# Patient Record
Sex: Female | Born: 1995 | Race: White | Hispanic: No | Marital: Married | Smoking: Never smoker
Health system: Southern US, Community
[De-identification: ages and names within clinical notes are randomized; demographics above are authoritative.]

## PROBLEM LIST (undated history)

## (undated) ENCOUNTER — Inpatient Hospital Stay (HOSPITAL_COMMUNITY): Payer: Self-pay

## (undated) DIAGNOSIS — E119 Type 2 diabetes mellitus without complications: Secondary | ICD-10-CM

## (undated) DIAGNOSIS — F909 Attention-deficit hyperactivity disorder, unspecified type: Secondary | ICD-10-CM

## (undated) DIAGNOSIS — N809 Endometriosis, unspecified: Secondary | ICD-10-CM

## (undated) DIAGNOSIS — N200 Calculus of kidney: Secondary | ICD-10-CM

## (undated) HISTORY — PX: TONSILLECTOMY: SUR1361

## (undated) HISTORY — PX: KIDNEY SURGERY: SHX687

---
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
## 2014-02-07 ENCOUNTER — Emergency Department: Payer: Self-pay

## 2014-02-07 LAB — URINALYSIS, COMPLETE
Bacteria: NONE SEEN
Bilirubin,UR: NEGATIVE
GLUCOSE, UR: NEGATIVE mg/dL (ref 0–75)
KETONE: NEGATIVE
Leukocyte Esterase: NEGATIVE
Nitrite: NEGATIVE
PH: 5 (ref 4.5–8.0)
PROTEIN: NEGATIVE
RBC,UR: 17 /HPF (ref 0–5)
SPECIFIC GRAVITY: 1.017 (ref 1.003–1.030)
Squamous Epithelial: 5
WBC UR: 2 /HPF (ref 0–5)

## 2014-02-07 LAB — CBC
HCT: 42.6 % (ref 35.0–47.0)
HGB: 14.7 g/dL (ref 12.0–16.0)
MCH: 30.1 pg (ref 26.0–34.0)
MCHC: 34.6 g/dL (ref 32.0–36.0)
MCV: 87 fL (ref 80–100)
PLATELETS: 384 10*3/uL (ref 150–440)
RBC: 4.89 10*6/uL (ref 3.80–5.20)
RDW: 12.6 % (ref 11.5–14.5)
WBC: 10.3 10*3/uL (ref 3.6–11.0)

## 2014-02-07 LAB — BASIC METABOLIC PANEL
Anion Gap: 5 — ABNORMAL LOW (ref 7–16)
BUN: 6 mg/dL — AB (ref 7–18)
CALCIUM: 9.3 mg/dL (ref 9.0–10.7)
Chloride: 104 mmol/L (ref 98–107)
Co2: 28 mmol/L (ref 21–32)
Creatinine: 0.53 mg/dL — ABNORMAL LOW (ref 0.60–1.30)
GLUCOSE: 88 mg/dL (ref 65–99)
Osmolality: 271 (ref 275–301)
Potassium: 3.7 mmol/L (ref 3.5–5.1)
Sodium: 137 mmol/L (ref 136–145)

## 2014-02-07 LAB — HCG, QUANTITATIVE, PREGNANCY: Beta Hcg, Quant.: 6864 m[IU]/mL — ABNORMAL HIGH

## 2014-02-08 IMAGING — US US OB < 14 WEEKS - US OB TV
2 series · 14 of 28 positions shown · non-contrast
Comparison: None.

CLINICAL DATA: Pregnant, 1st trimester bleeding

EXAM:
OBSTETRIC <14 WK US AND TRANSVAGINAL OB US
TECHNIQUE: Both transabdominal and transvaginal ultrasound examinations were
performed for complete evaluation of the gestation as well as the
maternal uterus, adnexal regions, and pelvic cul-de-sac.
Transvaginal technique was performed to assess early pregnancy.

[Series 1: us ob < 14 weeks - us ob tv · 0.20mm/px · 4 of 14 slices shown (1 of 2)]
[im 2/14]
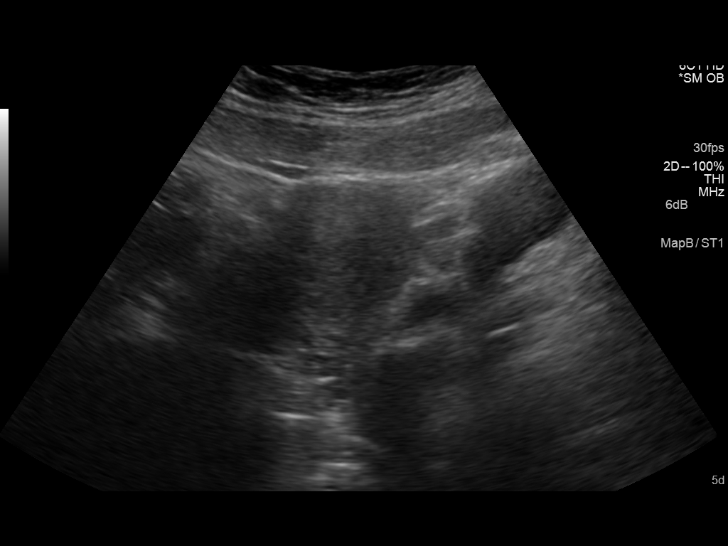
[im 6/14]
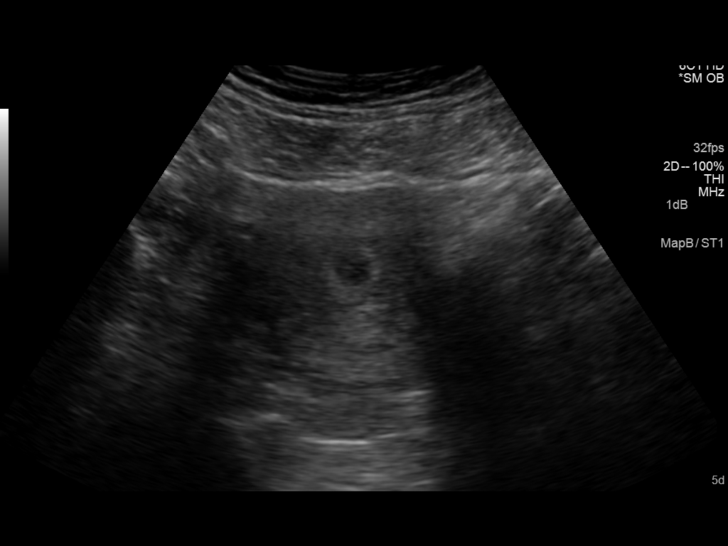
[im 10/14]
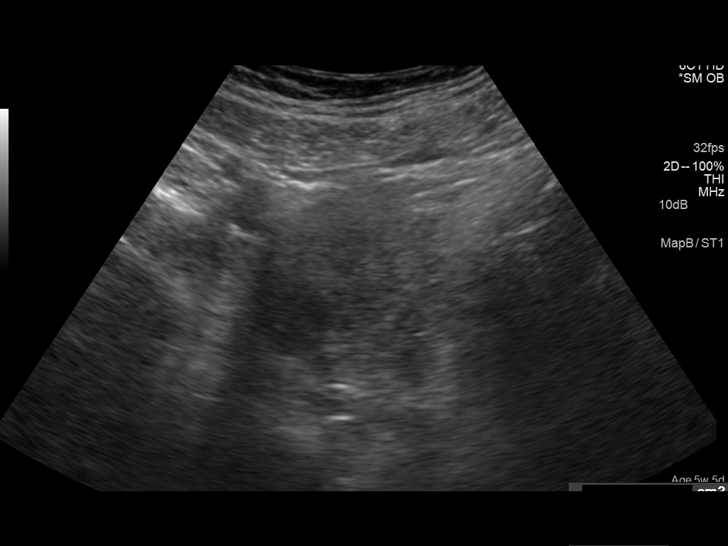
[im 14/14]
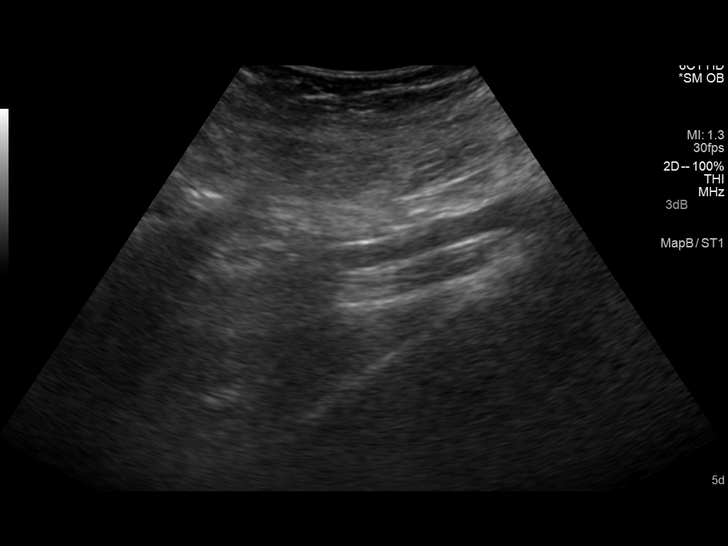

[Series 1001: us ob < 14 weeks - us ob tv · 0.12mm/px · 10 of 36 slices shown (2 of 2)]
[im 2/36]
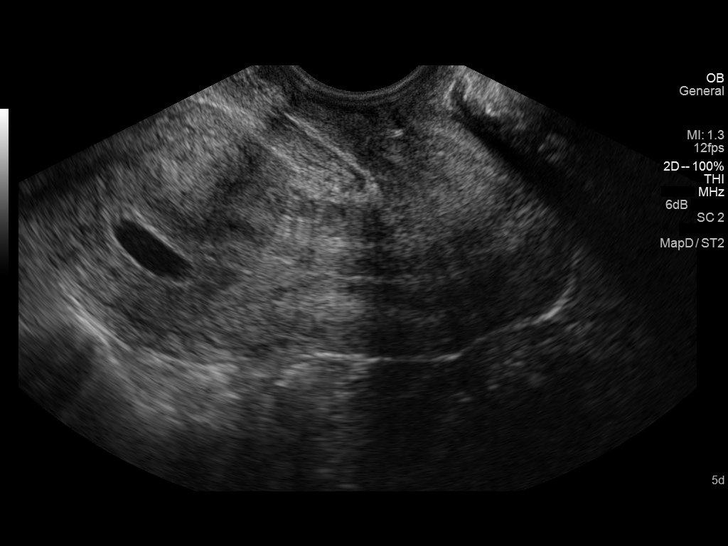
[im 6/36]
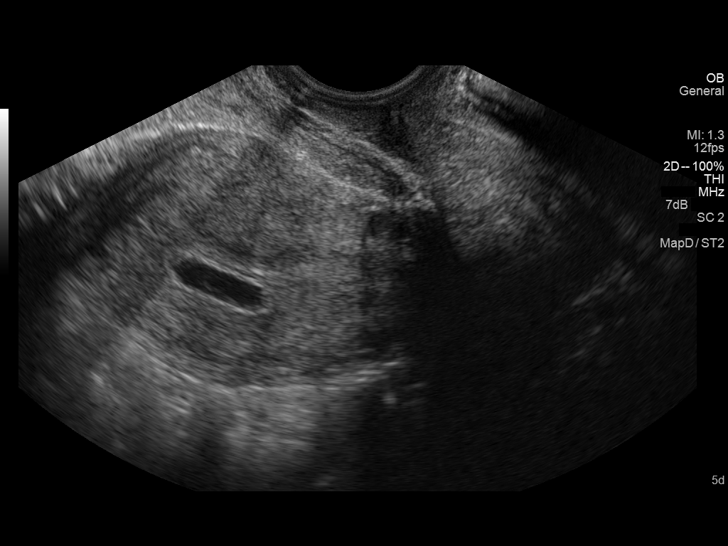
[im 10/36]
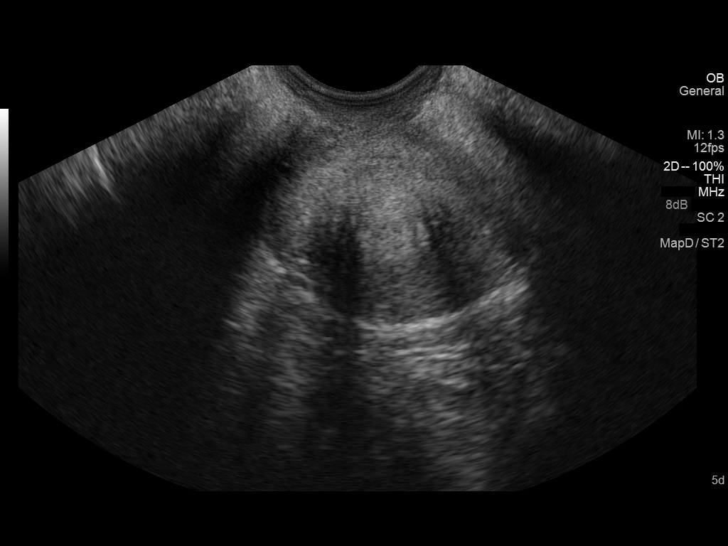
[im 13/36]
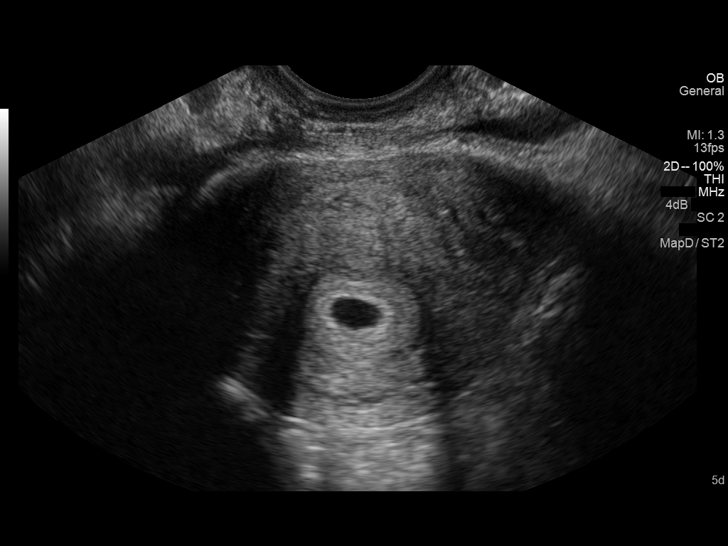
[im 17/36]
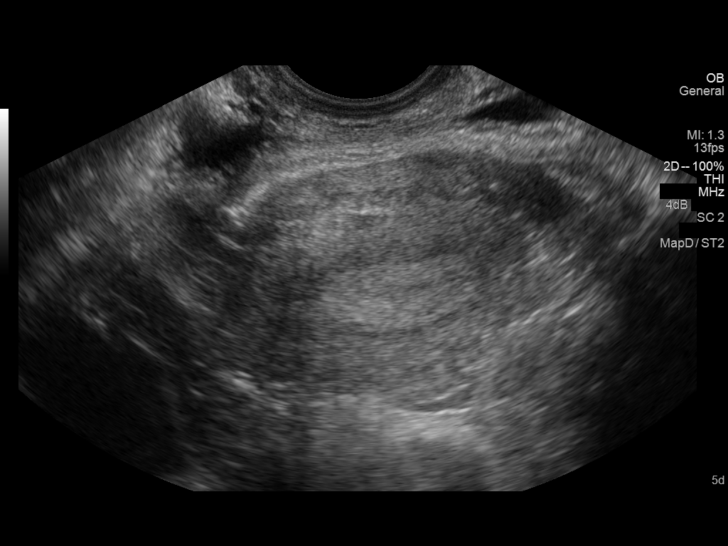
[im 21/36]
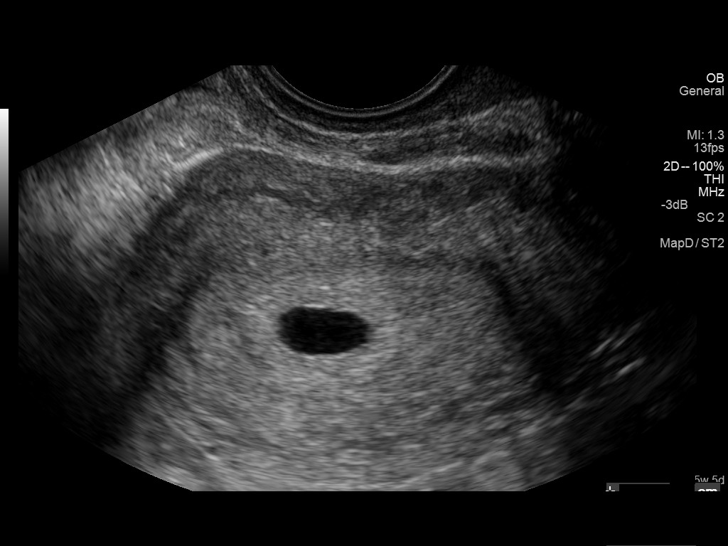
[im 24/36]
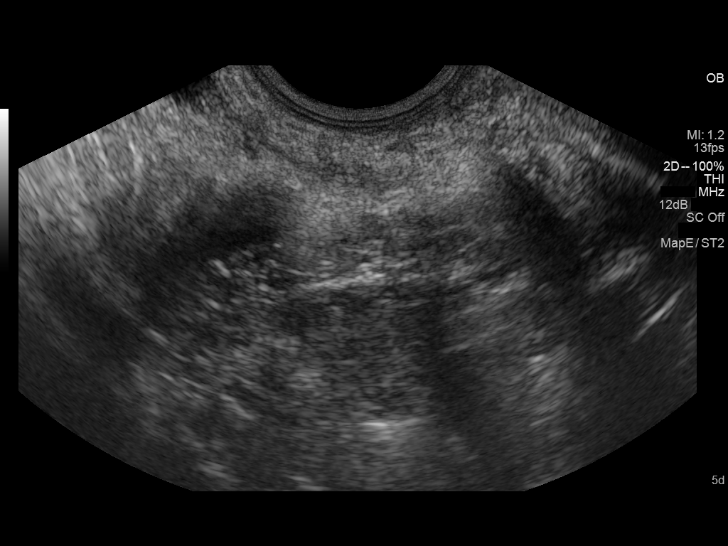
[im 28/36]
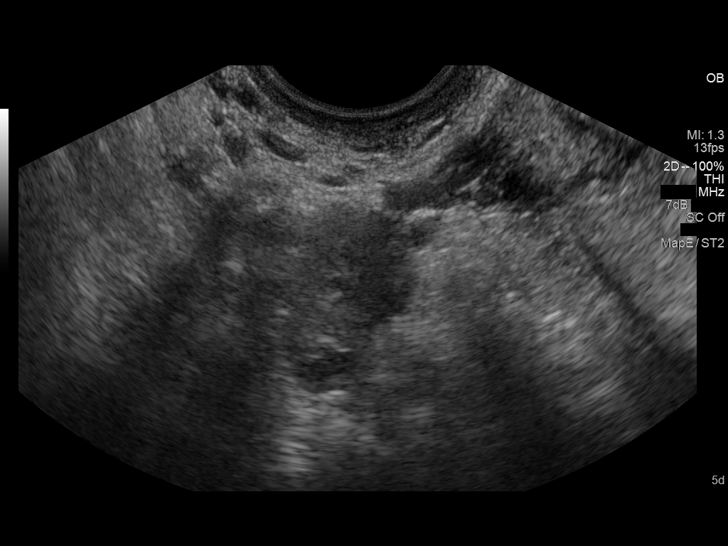
[im 32/36]
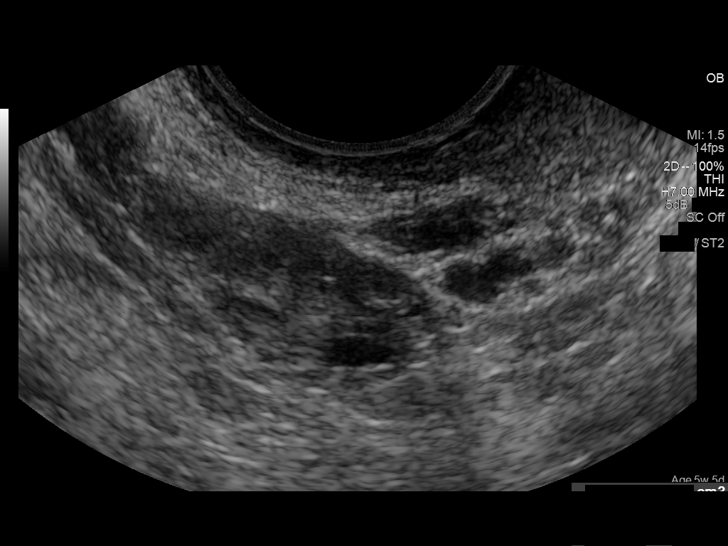
[im 36/36]
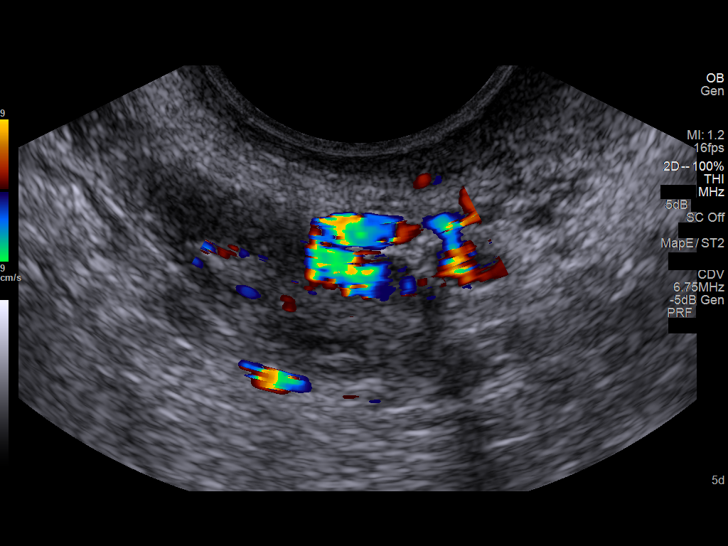

[14 of 28 positions shown; findings below may reference images not displayed]

FINDINGS: Intrauterine gestational sac: Visualized/normal in shape.

Yolk sac:  Present

Embryo:  Not visualized

MSD:  9  mm   5 w   5  d

US EDC: [DATE]

Maternal uterus/adnexae: No subchorionic hemorrhage.

Right ovary is within normal limits, measuring 2.2 x 1.7 x 1.6 cm.

Left ovary is within normal limits, measuring 1.8 x 2.9 x 1.2 cm.

No free fluid.
IMPRESSION: Single intrauterine gestational sac with yolk sac, measuring 5 weeks
5 days by mean sac diameter.

No fetal pole is visualized.

Consider follow-up pelvic ultrasound in 14 days to document
viability as clinically warranted.

## 2014-02-18 ENCOUNTER — Other Ambulatory Visit: Payer: Self-pay | Admitting: Obstetrics & Gynecology

## 2014-02-18 DIAGNOSIS — O3680X0 Pregnancy with inconclusive fetal viability, not applicable or unspecified: Secondary | ICD-10-CM

## 2014-02-20 ENCOUNTER — Other Ambulatory Visit: Payer: Self-pay

## 2014-05-22 ENCOUNTER — Observation Stay: Payer: Self-pay | Admitting: Obstetrics & Gynecology

## 2014-05-22 LAB — WET PREP, GENITAL

## 2014-05-22 LAB — URINALYSIS, COMPLETE
Bilirubin,UR: NEGATIVE
GLUCOSE, UR: NEGATIVE mg/dL (ref 0–75)
KETONE: NEGATIVE
Leukocyte Esterase: NEGATIVE
Nitrite: NEGATIVE
PROTEIN: NEGATIVE
Ph: 6 (ref 4.5–8.0)
RBC,UR: 16 /HPF (ref 0–5)
SPECIFIC GRAVITY: 1.009 (ref 1.003–1.030)

## 2014-06-28 ENCOUNTER — Observation Stay: Payer: Self-pay | Admitting: Obstetrics and Gynecology

## 2014-06-28 LAB — GC/CHLAMYDIA PROBE AMP

## 2014-09-10 ENCOUNTER — Observation Stay: Payer: Self-pay

## 2014-10-10 HISTORY — PX: GALLBLADDER SURGERY: SHX652

## 2014-12-24 DIAGNOSIS — K8050 Calculus of bile duct without cholangitis or cholecystitis without obstruction: Secondary | ICD-10-CM | POA: Insufficient documentation

## 2014-12-24 DIAGNOSIS — K8020 Calculus of gallbladder without cholecystitis without obstruction: Secondary | ICD-10-CM | POA: Insufficient documentation

## 2015-01-01 DIAGNOSIS — Z9049 Acquired absence of other specified parts of digestive tract: Secondary | ICD-10-CM | POA: Insufficient documentation

## 2015-01-07 DIAGNOSIS — R7401 Elevation of levels of liver transaminase levels: Secondary | ICD-10-CM | POA: Insufficient documentation

## 2015-01-07 DIAGNOSIS — R109 Unspecified abdominal pain: Secondary | ICD-10-CM | POA: Diagnosis present

## 2015-01-21 DIAGNOSIS — K921 Melena: Secondary | ICD-10-CM | POA: Insufficient documentation

## 2015-02-17 NOTE — H&P (Signed)
L&D Evaluation:  History Expanded:  HPI 19 yo G1P0 at 2920 5/7 weeks w crampiness and d/c tonight.  Irreg crampiness off and on this pregnancy.  Recent Ultrasound normal.  [facility].   No ROM, vaginal bleeding.   Gravida 1   Term 0   Patient's Medical History No Chronic Illness   Patient's Surgical History none   Medications Pre Natal Vitamins   Allergies NKDA   Social History none   Family History Non-Contributory   ROS:  ROS All systems were reviewed.  HEENT, CNS, GI, GU, Respiratory, CV, Renal and Musculoskeletal systems were found to be normal.   Exam:  Vital Signs stable   General no apparent distress   Mental Status clear   Chest clear   Heart normal sinus rhythm   Abdomen gravid, non-tender, gravid, tender with contractions   Estimated Fetal Weight Average for gestational age   Back no CVAT   Edema no edema   Pelvic no external lesions, cervix closed and thick, SPE- Neg pool and nitrazine, scant d/c (clear)   Mebranes Intact   FHT normal rate with no decels   Fetal Heart Rate 140   Ucx absent   Skin dry   Impression:  Impression Abd pain, d/c.   Plan:  Plan UA, wet prep   Follow Up Appointment already scheduled   Electronic Signatures: Letitia LibraHarris, Nekeshia Lenhardt Paul (MD)  (Signed 13-Aug-15 23:16)  Authored: L&D Evaluation   Last Updated: 13-Aug-15 23:16 by Letitia LibraHarris, Brailyn Delman Paul (MD)

## 2015-02-17 NOTE — H&P (Signed)
L&D Evaluation:  History:  HPI 19 year old G1 at 6310w1d gestation by D=7wk US derived EDC of 10/03/14 presenting with painless vaginal spotting.  No recent intercourse no recent cervical exam.  Bright red bleeding, noted after whipping from going to restroom.  No further bleeding since.  +FM, no LOF, no VB, no ctx    [facility].  Recently treated for UTI but UCx with no growth.   Presents with vaginal bleeding   Patient's Medical History IBS   Patient's Surgical History ovarian cystectomy and tonsillectomy   Medications Pre Natal Vitamins  macrobid   Allergies NKDA   Social History none   Family History Non-Contributory   Exam:  Vital Signs stable   Urine Protein not completed   General no apparent distress   Mental Status clear   Chest no increased work of breathing   Abdomen gravid, non-tender   Estimated Fetal Weight Average for gestational age   Back no CVAT   Edema no edema   Pelvic no external lesions, cervix closed and thick, no active bleeding or old blood visualized   Mebranes Intact   FHT normal rate with no decels   Ucx absent   Impression:  Impression 2nd trimester bleeding   Plan:  Comments 1) VB - no evidence of active bleeding, most likley source is cervical/ectroprion during pregnancy.  Routine precautions      - GC & CT cultures sent  2) Fetus - category I tracing  3) O pos / ABSC neg / RI / VZNI / HIV neg / RPR NR / HBsAg neg / negative 1st trimester screen  4) Disposition - discharge home follow up 07/16/14   Follow Up Appointment 07/16/2014   Electronic Signatures: Lorrene ReidStaebler, Robt Okuda M (MD)  (Signed 19-Sep-15 16:48)  Authored: L&D Evaluation   Last Updated: 19-Sep-15 16:48 by Lorrene ReidStaebler, Brihana Quickel M (MD)

## 2016-06-24 ENCOUNTER — Encounter: Payer: Self-pay | Admitting: Emergency Medicine

## 2016-06-24 ENCOUNTER — Emergency Department: Payer: Self-pay

## 2016-06-24 ENCOUNTER — Emergency Department
Admission: EM | Admit: 2016-06-24 | Discharge: 2016-06-24 | Disposition: A | Discharge status: Home / Self Care | Payer: Self-pay | Attending: Emergency Medicine | Admitting: Emergency Medicine

## 2016-06-24 DIAGNOSIS — N200 Calculus of kidney: Secondary | ICD-10-CM | POA: Insufficient documentation

## 2016-06-24 HISTORY — DX: Calculus of kidney: N20.0

## 2016-06-24 LAB — CBC
HCT: 42.7 % (ref 35.0–47.0)
HEMOGLOBIN: 14.8 g/dL (ref 12.0–16.0)
MCH: 29.1 pg (ref 26.0–34.0)
MCHC: 34.8 g/dL (ref 32.0–36.0)
MCV: 83.6 fL (ref 80.0–100.0)
PLATELETS: 421 10*3/uL (ref 150–440)
RBC: 5.1 MIL/uL (ref 3.80–5.20)
RDW: 13 % (ref 11.5–14.5)
WBC: 8.7 10*3/uL (ref 3.6–11.0)

## 2016-06-24 LAB — URINALYSIS COMPLETE WITH MICROSCOPIC (ARMC ONLY)
BILIRUBIN URINE: NEGATIVE
Bacteria, UA: NONE SEEN
Glucose, UA: NEGATIVE mg/dL
KETONES UR: NEGATIVE mg/dL
LEUKOCYTES UA: NEGATIVE
Nitrite: NEGATIVE
PH: 5 (ref 5.0–8.0)
Protein, ur: NEGATIVE mg/dL
Specific Gravity, Urine: 1.019 (ref 1.005–1.030)

## 2016-06-24 LAB — COMPREHENSIVE METABOLIC PANEL
ALT: 27 U/L (ref 14–54)
ANION GAP: 7 (ref 5–15)
AST: 19 U/L (ref 15–41)
Albumin: 4.3 g/dL (ref 3.5–5.0)
Alkaline Phosphatase: 78 U/L (ref 38–126)
BUN: 11 mg/dL (ref 6–20)
CALCIUM: 9.8 mg/dL (ref 8.9–10.3)
CHLORIDE: 105 mmol/L (ref 101–111)
CO2: 29 mmol/L (ref 22–32)
CREATININE: 0.8 mg/dL (ref 0.44–1.00)
Glucose, Bld: 94 mg/dL (ref 65–99)
Potassium: 3.8 mmol/L (ref 3.5–5.1)
SODIUM: 141 mmol/L (ref 135–145)
Total Bilirubin: 0.2 mg/dL — ABNORMAL LOW (ref 0.3–1.2)
Total Protein: 7.8 g/dL (ref 6.5–8.1)

## 2016-06-24 LAB — POCT PREGNANCY, URINE: PREG TEST UR: NEGATIVE

## 2016-06-24 IMAGING — CT CT RENAL STONE PROTOCOL
2 of 4 series · 16 of 46 positions shown, 18 images · non-contrast
Comparison: None available.

CLINICAL DATA: Initial evaluation for acute left lower quadrant
pain radiating to left flank.

EXAM:
CT ABDOMEN AND PELVIS WITHOUT CONTRAST
TECHNIQUE: Multidetector CT imaging of the abdomen and pelvis was performed
following the standard protocol without IV contrast.

[Series 2: axial st · axial · 0.84mm/px · z∈[-836,-420]mm · 13 of 91 slices shown, 15 images]
[im 4/91  soft-tissue]
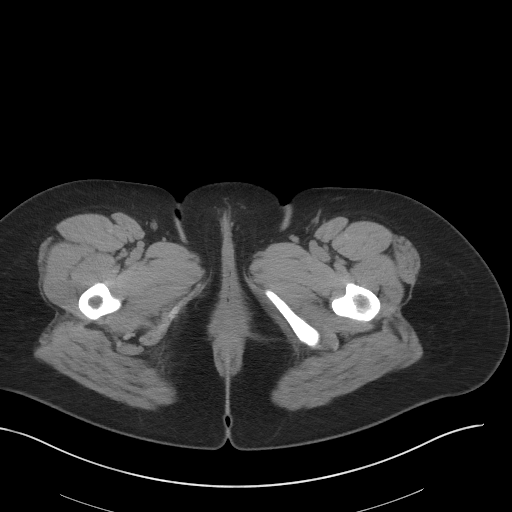
[im 4/91  bone]
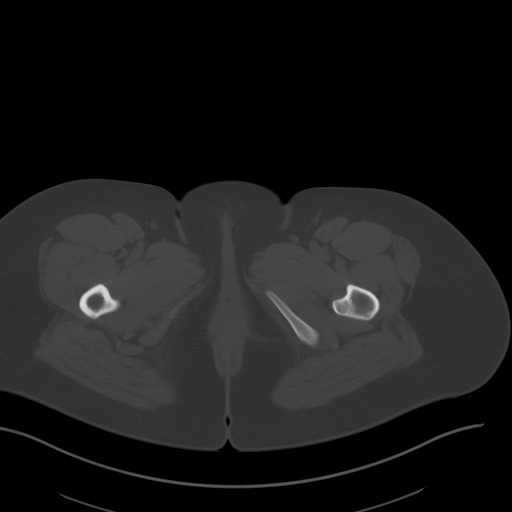
[im 11/91  soft-tissue]
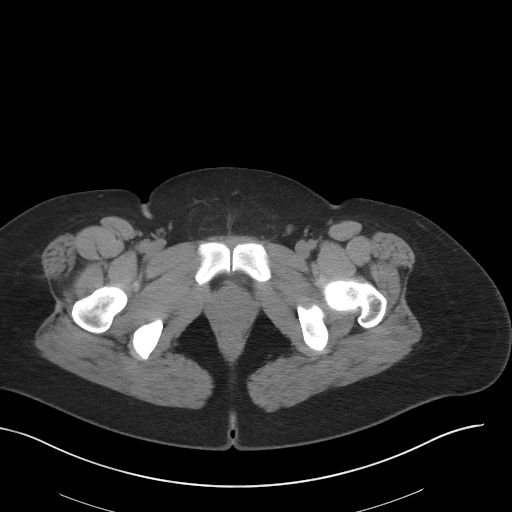
[im 19/91  soft-tissue]
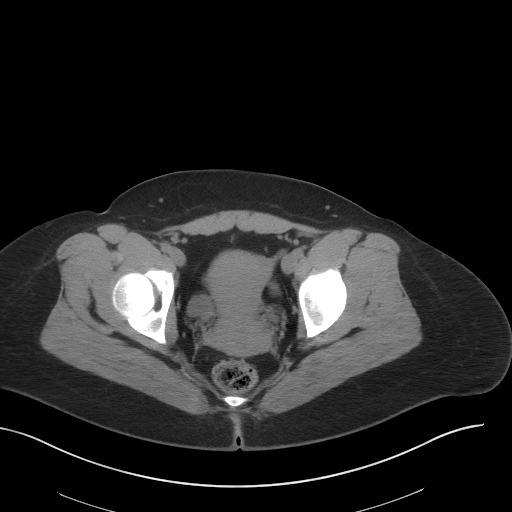
[im 26/91  soft-tissue]
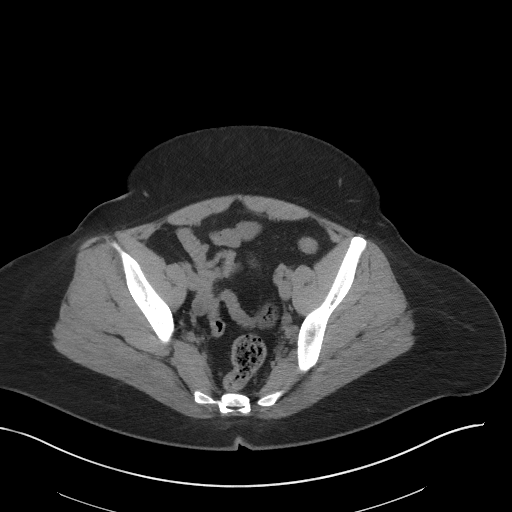
[im 33/91  soft-tissue]
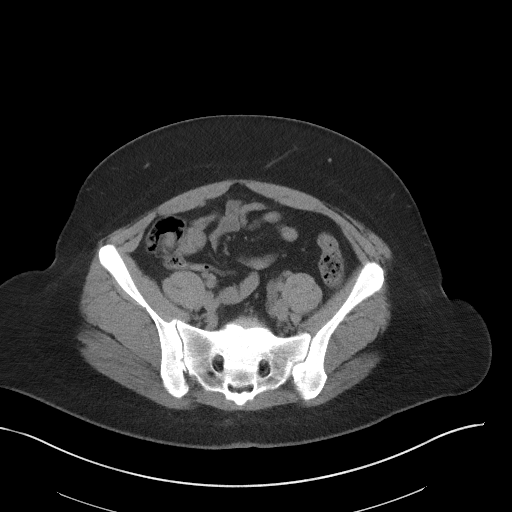
[im 40/91  soft-tissue]
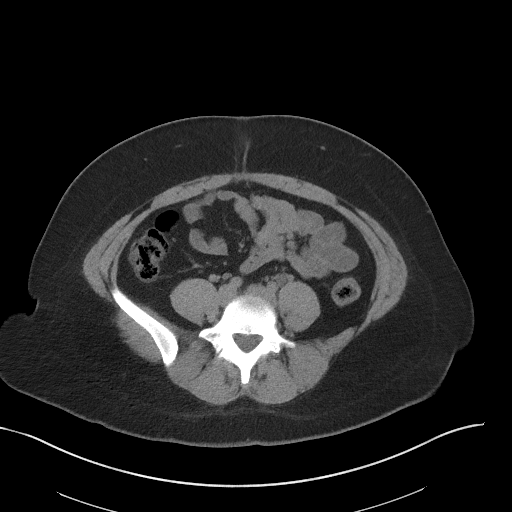
[im 47/91  soft-tissue]
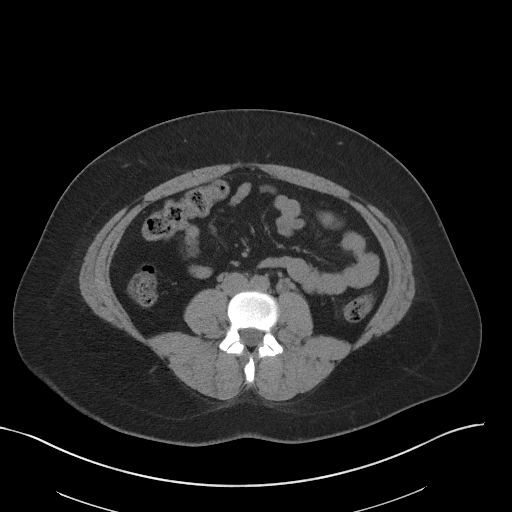
[im 51/91  soft-tissue]
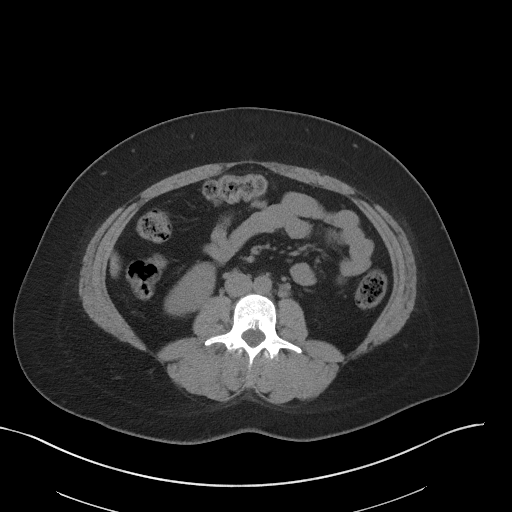
[im 58/91  soft-tissue]
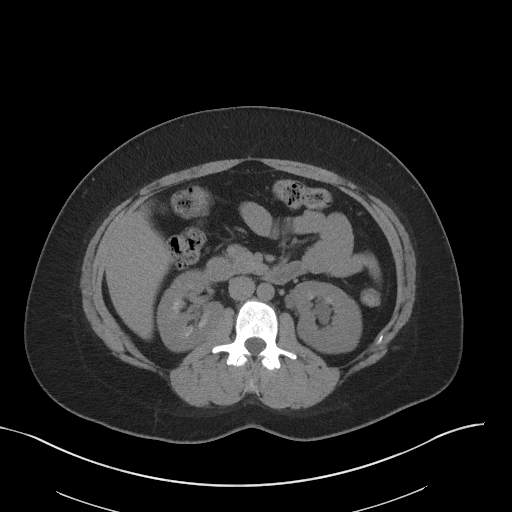
[im 58/91  bone]
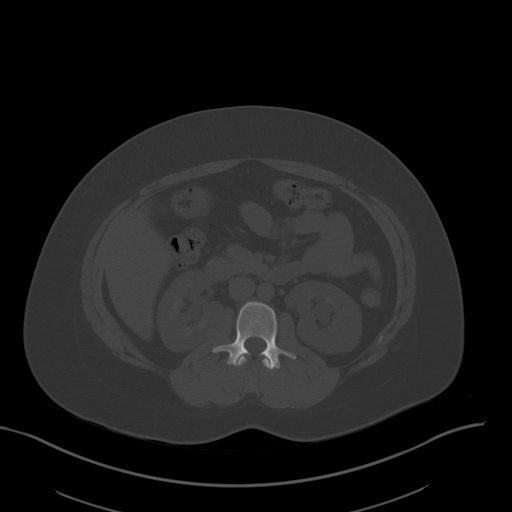
[im 65/91  soft-tissue]
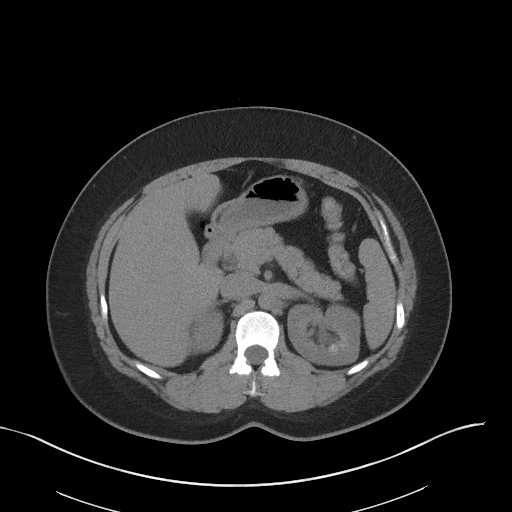
[im 73/91  soft-tissue]
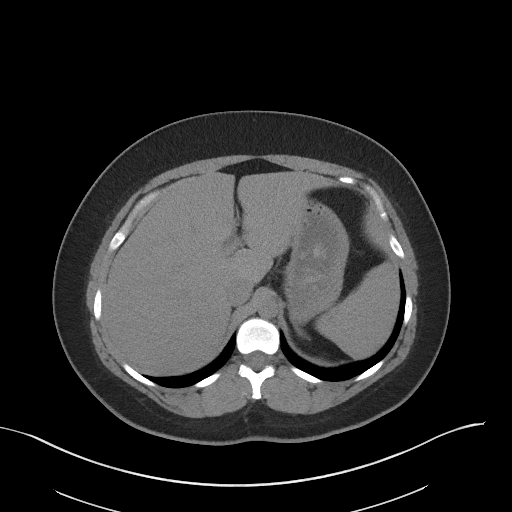
[im 80/91  soft-tissue]
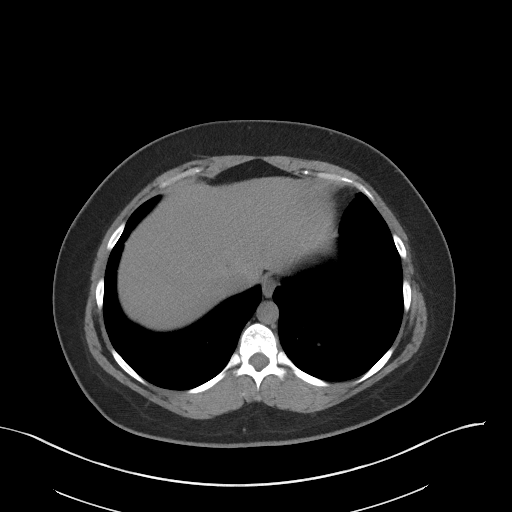
[im 87/91  soft-tissue]
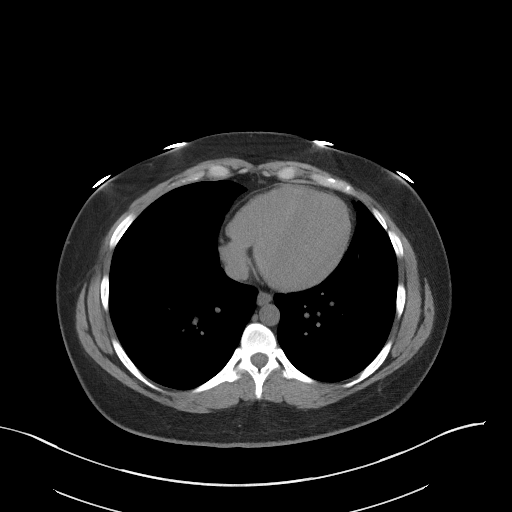

[Series 5: coronal · coronal · 0.76mm/px · 3 of 136 slices shown]
[im 46/136  soft-tissue]
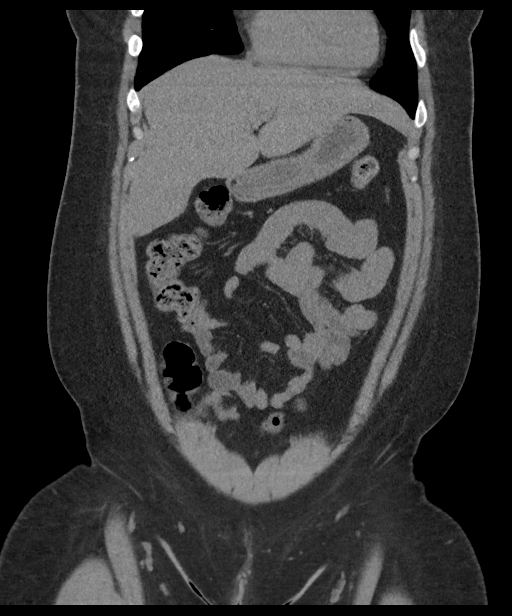
[im 61/136  soft-tissue]
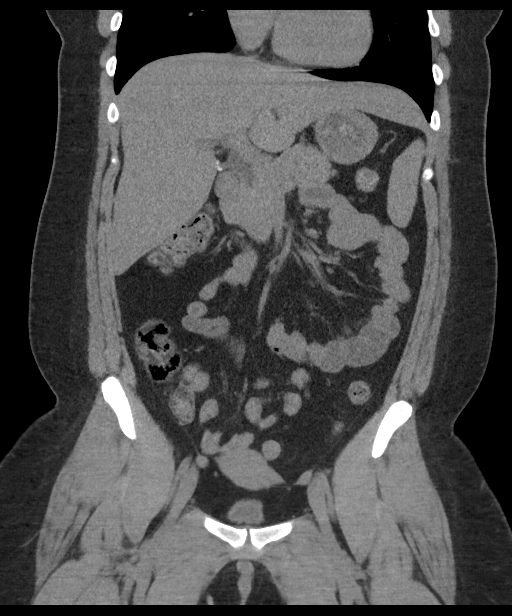
[im 76/136  soft-tissue]
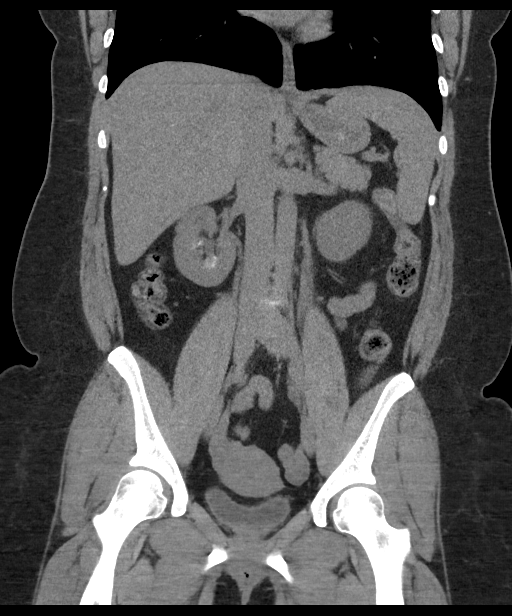

[16 of 46 positions shown; findings below may reference images not displayed]

FINDINGS: Lower chest: Visualized lung bases are clear.

Hepatobiliary: Limited noncontrast evaluation of the liver is
unremarkable. Gallbladder absent. No biliary dilatation.

Pancreas: Pancreas within normal limits without acute inflammatory
changes.

Spleen: Spleen is within normal limits.

Adrenals/Urinary Tract: Adrenal glands normal. Parenchymal
hyperdensity with scattered nonobstructive stones involve the renal
pyramids bilaterally, suggestive of medullary nephrocalcinosis.
Nonobstructive stones measure up to 4 mm. There is mild left
hydroureteronephrosis. 7 mm stone seen layering within the posterior
bladder, compatible with a recently passed stone. No right-sided
hydronephrosis or hydroureter.

Stomach/Bowel: Stomach within normal limits. No evidence for bowel
obstruction. Appendix normal. No acute inflammatory changes about
the bowels.

Vascular/Lymphatic: Aorta of normal caliber without abnormality. No
adenopathy.

Reproductive: Uterus and ovaries within normal limits.

Other: No free air or fluid.

Musculoskeletal: No acute osseous abnormality. No worrisome lytic or
blastic osseous lesions.
IMPRESSION: 1. Mild left hydroureteronephrosis with 7 mm stone layering within
the bladder lumen, consistent with a recently passed stone.
2. Findings suggestive of medullary nephrocalcinosis with scattered
nonobstructive calculi as above.
3. No other acute intra-abdominal or pelvic process.

## 2016-06-24 MED ORDER — KETOROLAC TROMETHAMINE 30 MG/ML IJ SOLN
15.0000 mg | Freq: Once | INTRAMUSCULAR | Status: AC
  Administered 2016-06-24: 15 mg via INTRAVENOUS

## 2016-06-24 MED ORDER — ONDANSETRON 4 MG PO TBDP: 4.0000 mg | ORAL_TABLET | Freq: Four times a day (QID) | ORAL | 0 refills | Status: DC | PRN

## 2016-06-24 MED ORDER — ONDANSETRON HCL 4 MG/2ML IJ SOLN
4.0000 mg | Freq: Once | INTRAMUSCULAR | Status: AC | PRN
  Administered 2016-06-24: 4 mg via INTRAVENOUS
  Filled 2016-06-24: qty 2

## 2016-06-24 MED ORDER — MORPHINE SULFATE (PF) 4 MG/ML IV SOLN
INTRAVENOUS | Status: AC
  Filled 2016-06-24: qty 1

## 2016-06-24 MED ORDER — ONDANSETRON HCL 4 MG/2ML IJ SOLN
INTRAMUSCULAR | Status: AC
  Administered 2016-06-24: 4 mg via INTRAVENOUS
  Filled 2016-06-24: qty 2

## 2016-06-24 MED ORDER — SODIUM CHLORIDE 0.9 % IV BOLUS (SEPSIS)
1000.0000 mL | Freq: Once | INTRAVENOUS | Status: AC
  Administered 2016-06-24: 1000 mL via INTRAVENOUS

## 2016-06-24 MED ORDER — KETOROLAC TROMETHAMINE 30 MG/ML IJ SOLN
INTRAMUSCULAR | Status: AC
  Administered 2016-06-24: 15 mg via INTRAVENOUS
  Filled 2016-06-24: qty 1

## 2016-06-24 MED ORDER — ONDANSETRON HCL 4 MG/2ML IJ SOLN
4.0000 mg | Freq: Once | INTRAMUSCULAR | Status: AC
  Administered 2016-06-24: 4 mg via INTRAVENOUS

## 2016-06-24 MED ORDER — MORPHINE SULFATE (PF) 4 MG/ML IV SOLN
4.0000 mg | Freq: Once | INTRAVENOUS | Status: AC
  Administered 2016-06-24: 4 mg via INTRAVENOUS

## 2016-06-24 MED ORDER — HYDROCODONE-ACETAMINOPHEN 5-325 MG PO TABS: 1.0000 | ORAL_TABLET | Freq: Four times a day (QID) | ORAL | 0 refills | Status: DC | PRN

## 2016-06-24 NOTE — ED Notes (Addendum)
Pt reports LLQ pain since yesterday.  Pt reports genitalia reddened, and pain has increased and spread

## 2016-06-24 NOTE — ED Notes (Signed)
Discharge instructions given, urine strainer given to catch kidney stone, prescription medication reviewed, informed patient that someone else should drive her home due to medications administered during her ED visit; patient being transported by friend; patient verbalized understanding of discharge instructions; patient is ambulatory and in stable condition.

## 2016-06-24 NOTE — ED Provider Notes (Signed)
[facility] Emergency Department Provider Note   ____________________________________________   First MD Initiated Contact with Patient 06/24/16 2030     (approximate)  I have reviewed the triage vital signs and the nursing notes.   HISTORY  Chief Complaint Pelvic Pain    HPI TAMELLA TUCCILLO is a 20 y.o. female reports that she's been having pain in the lower left abdomen that radiates around to her left kidney/flank. This began yesterday. Associated with mild nausea, and at times fairly severe pain in the left lower abdomen that radiates up towards her left back. She reports she has a history of both cysts on her kidneys as well as previous passage of kidney stones. She reports today's pain doesn't really feel like either one, but more as a combination of the 2.  She is not currently on her menstrual cycle. She denies any chest pain fevers or chills. No vaginal discharge. Currently she is not sexually active   Past Medical History:  Diagnosis Date  . Renal calculi     There are no active problems to display for this patient.   No past surgical history on file.  Prior to Admission medications   Medication Sig Start Date End Date Taking? Authorizing Provider  HYDROcodone-acetaminophen (NORCO/VICODIN) 5-325 MG tablet Take 1 tablet by mouth every 6 (six) hours as needed for moderate pain. 06/24/16   Sharyn Creamer, MD  ondansetron (ZOFRAN ODT) 4 MG disintegrating tablet Take 1 tablet (4 mg total) by mouth every 6 (six) hours as needed for nausea or vomiting. 06/24/16   Sharyn Creamer, MD    Allergies Review of patient's allergies indicates no known allergies.  No family history on file.  Social History Social History  Substance Use Topics  . Smoking status: Never Smoker  . Smokeless tobacco: Never Used  . Alcohol use No    Review of Systems Constitutional: No fever/chills Eyes: No visual changes. ENT: No sore throat. Cardiovascular: Denies chest  pain. Respiratory: Denies shortness of breath. Gastrointestinal: No right-sided abdominal pain    No diarrhea.  No constipation. Genitourinary: Negative for dysuria. Musculoskeletal: Negative for back pain. Skin: Negative for rash. Neurological: Negative for headaches, focal weakness or numbness.  10-point ROS otherwise negative.  ____________________________________________   PHYSICAL EXAM:  VITAL SIGNS: ED Triage Vitals  Enc Vitals Group     BP 06/24/16 1906 127/86     Pulse Rate 06/24/16 1906 63     Resp 06/24/16 1906 16     Temp 06/24/16 1906 98.1 F (36.7 C)     Temp Source 06/24/16 1906 Oral     SpO2 06/24/16 1906 100 %     Weight 06/24/16 1907 180 lb (81.6 kg)     Height 06/24/16 1907 5' (1.524 m)     Head Circumference --      Peak Flow --      Pain Score 06/24/16 1907 10     Pain Loc --      Pain Edu? --      Excl. in GC? --     Constitutional: Alert and oriented. Well appearing and in no acute distress. Eyes: Conjunctivae are normal. PERRL. EOMI. Head: Atraumatic. Nose: No congestion/rhinnorhea. Mouth/Throat: Mucous membranes are moist.  Oropharynx non-erythematous. Neck: No stridor.   Cardiovascular: Normal rate, regular rhythm. Grossly normal heart sounds.  Good peripheral circulation. Respiratory: Normal respiratory effort.  No retractions. Lungs CTAB. Gastrointestinal: Soft and nontenderExcept for mild to moderate tenderness around the left flank. No distention.  No abdominal bruits. No CVA tenderness.  Musculoskeletal: No lower extremity tenderness nor edema.  No joint effusions. Neurologic:  Normal speech and language. No gross focal neurologic deficits are appreciated. Skin:  Skin is warm, dry and intact. No rash noted. Psychiatric: Mood and affect are normal. Speech and behavior are normal.  ____________________________________________   LABS (all labs ordered are listed, but only abnormal results are displayed)  Labs Reviewed  COMPREHENSIVE  METABOLIC PANEL - Abnormal; Notable for the following:       Result Value   Total Bilirubin 0.2 (*)    All other components within normal limits  URINALYSIS COMPLETEWITH MICROSCOPIC ([facility] ONLY) - Abnormal; Notable for the following:    Color, Urine YELLOW (*)    APPearance CLEAR (*)    Hgb urine dipstick 3+ (*)    Squamous Epithelial / LPF 0-5 (*)    All other components within normal limits  CBC  POC URINE PREG, ED  POCT PREGNANCY, URINE   ____________________________________________  EKG   ____________________________________________  RADIOLOGY  Ct Renal Stone Study  Result Date: 06/24/2016 CLINICAL DATA:  Initial evaluation for acute left lower quadrant pain radiating to left flank. EXAM: CT ABDOMEN AND PELVIS WITHOUT CONTRAST TECHNIQUE: Multidetector CT imaging of the abdomen and pelvis was performed following the standard protocol without IV contrast. COMPARISON:  None available. FINDINGS: Lower chest: Visualized lung bases are clear. Hepatobiliary: Limited noncontrast evaluation of the liver is unremarkable. Gallbladder absent. No biliary dilatation. Pancreas: Pancreas within normal limits without acute inflammatory changes. Spleen: Spleen is within normal limits. Adrenals/Urinary Tract: Adrenal glands normal. Parenchymal hyperdensity with scattered nonobstructive stones involve the renal pyramids bilaterally, suggestive of medullary nephrocalcinosis. Nonobstructive stones measure up to 4 mm. There is mild left hydroureteronephrosis. 7 mm stone seen layering within the posterior bladder, compatible with a recently passed stone. No right-sided hydronephrosis or hydroureter. Stomach/Bowel: Stomach within normal limits. No evidence for bowel obstruction. Appendix normal. No acute inflammatory changes about the bowels. Vascular/Lymphatic: Aorta of normal caliber without abnormality. No adenopathy. Reproductive: Uterus and ovaries within normal limits. Other: No free air or fluid.  Musculoskeletal: No acute osseous abnormality. No worrisome lytic or blastic osseous lesions. IMPRESSION: 1. Mild left hydroureteronephrosis with 7 mm stone layering within the bladder lumen, consistent with a recently passed stone. 2. 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 3. No other acute intra-abdominal or pelvic process. Electronically Signed   By: Rise MuBenjamin  McClintock M.D.   On: 06/24/2016 21:28    ____________________________________________   PROCEDURES  Procedure(s) performed: None  Procedures  Critical Care performed: No  ____________________________________________   INITIAL IMPRESSION / ASSESSMENT AND PLAN / ED COURSE  Pertinent labs & imaging results that were available during my care of the patient were reviewed by me and considered in my medical decision making (see chart for details).  Differential diagnosis includes but is not limited to, abdominal perforation, aortic dissection, cholecystitis, appendicitis, diverticulitis, colitis, esophagitis/gastritis, kidney stone, pyelonephritis, urinary tract infection, aortic aneurysm. All are considered in decision and treatment plan. Based upon the patient's presentation and risk factors, clinical history, examination I suspect likely nephrolithiasis. Patient does have mild hematuria.  ----------------------------------------- 9:48 PM on 06/24/2016 -----------------------------------------   patient reports her pain is well-controlled. CT appears consistent with a kidney stone, likely passed now into the bladder on the left. I will prescribe the patient a narcotic pain medicine due to their condition which I anticipate will cause at least moderate pain short term. I discussed  with the patient safe use of narcotic pain medicines, and that they are not to drive, work in dangerous areas, or ever take more than prescribed (no more than 1 pill every 6 hours). We discussed that  this is the type of medication that can be  overdosed on and the risks of this type of medicine. Patient is very agreeable to only use as prescribed and to never use more than prescribed.  Return precautions and treatment recommendations and follow-up discussed with the patient who is agreeable with the plan.   ____________________________________________   FINAL CLINICAL IMPRESSION(S) / ED DIAGNOSES  Final diagnoses:  Kidney stone on left side      NEW MEDICATIONS STARTED DURING THIS VISIT:  New Prescriptions   HYDROCODONE-ACETAMINOPHEN (NORCO/VICODIN) 5-325 MG TABLET    Take 1 tablet by mouth every 6 (six) hours as needed for moderate pain.   ONDANSETRON (ZOFRAN ODT) 4 MG DISINTEGRATING TABLET    Take 1 tablet (4 mg total) by mouth every 6 (six) hours as needed for nausea or vomiting.     Note:  This document was prepared using Dragon voice recognition software and may include unintentional dictation errors.     Sharyn Creamer, MD 06/24/16 2149

## 2016-06-24 NOTE — Discharge Instructions (Signed)
You have been seen in the Emergency Department (ED) today for pain that we believe based on your workup, is caused by kidney stones.  As we have discussed, please drink plenty of fluids.  Please make a follow up appointment with the physician(s) listed elsewhere in this documentation. ° °You may take pain medication as needed but ONLY as prescribed.   We also recommend that you take over-the-counter ibuprofen regularly according to label instructions over the next 5 days.  Take it with meals to minimize stomach discomfort. ° °Please see your doctor as soon as possible as stones may take 1-3 weeks to pass and you may require additional care or medications. ° °Do not drink alcohol, drive or participate in any other potentially dangerous activities while taking opiate pain medication as it may make you sleepy. Do not take this medication with any other sedating medications, either prescription or over-the-counter. If you were prescribed Percocet or Vicodin, do not take these with acetaminophen (Tylenol) as it is already contained within these medications. °  °This medication is an opiate (or narcotic) pain medication and can be habit forming.  Use it as little as possible to achieve adequate pain control.  Do not use or use it with extreme caution if you have a history of opiate abuse or dependence.  If you are on a pain contract with your primary care doctor or a pain specialist, be sure to let them know you were prescribed this medication today from the [facility].  This medication is intended for your use only - do not give any to anyone else and keep it in a secure place where nobody else, especially children, have access to it.  It will also cause or worsen constipation, so you may want to consider taking an over-the-counter stool softener while you are taking this medication. ° °Return to the Emergency Department (ED) or call your doctor if you have any worsening pain, fever, painful  urination, are unable to urinate, or develop other symptoms that concern you. ° °

## 2016-06-24 NOTE — ED Triage Notes (Signed)
Pt states left lower quadrant pain that radiates to left flank with emesis and nausea. Pt states history of renal calculi but states this pain feels different.

## 2017-05-17 DIAGNOSIS — R519 Headache, unspecified: Secondary | ICD-10-CM | POA: Insufficient documentation

## 2017-05-17 DIAGNOSIS — E669 Obesity, unspecified: Secondary | ICD-10-CM | POA: Insufficient documentation

## 2017-07-04 DIAGNOSIS — G4733 Obstructive sleep apnea (adult) (pediatric): Secondary | ICD-10-CM | POA: Insufficient documentation

## 2017-10-10 HISTORY — PX: CYST REMOVAL TRUNK: SHX6283

## 2017-12-11 DIAGNOSIS — L0592 Pilonidal sinus without abscess: Secondary | ICD-10-CM | POA: Diagnosis not present

## 2018-02-12 DIAGNOSIS — I471 Supraventricular tachycardia: Secondary | ICD-10-CM | POA: Diagnosis not present

## 2018-02-12 DIAGNOSIS — N898 Other specified noninflammatory disorders of vagina: Secondary | ICD-10-CM | POA: Diagnosis not present

## 2018-02-22 DIAGNOSIS — R079 Chest pain, unspecified: Secondary | ICD-10-CM | POA: Diagnosis not present

## 2018-02-22 DIAGNOSIS — R002 Palpitations: Secondary | ICD-10-CM | POA: Diagnosis not present

## 2018-04-20 DIAGNOSIS — K219 Gastro-esophageal reflux disease without esophagitis: Secondary | ICD-10-CM | POA: Diagnosis not present

## 2018-04-20 DIAGNOSIS — R002 Palpitations: Secondary | ICD-10-CM | POA: Diagnosis not present

## 2018-04-20 DIAGNOSIS — R079 Chest pain, unspecified: Secondary | ICD-10-CM | POA: Diagnosis not present

## 2018-04-20 DIAGNOSIS — R0789 Other chest pain: Secondary | ICD-10-CM | POA: Diagnosis not present

## 2018-05-03 ENCOUNTER — Ambulatory Visit: Payer: Medicaid Other | Admitting: Internal Medicine

## 2018-05-03 ENCOUNTER — Encounter: Payer: Self-pay | Admitting: Internal Medicine

## 2018-05-03 VITALS — BP 126/88 | HR 67 | Ht 60.0 in | Wt 195.0 lb

## 2018-05-03 DIAGNOSIS — N200 Calculus of kidney: Secondary | ICD-10-CM | POA: Diagnosis not present

## 2018-05-03 DIAGNOSIS — I471 Supraventricular tachycardia: Secondary | ICD-10-CM | POA: Diagnosis not present

## 2018-05-03 NOTE — Patient Instructions (Signed)
Medication Instructions: - no changes  Labwork: - none ordered  Procedures/Testing: - none ordered  Follow-Up: - pending review of your heart monitor from [facility]  Any Additional Special Instructions Will Be Listed Below (If Applicable).     If you need a refill on your cardiac medications before your next appointment, please call your pharmacy.

## 2018-05-03 NOTE — Progress Notes (Signed)
ELECTROPHYSIOLOGY OFFICE NOTE  Patient ID: Sandra Gibbs, MRN: [id number], DOB/AGE: 1996/08/25 22 y.o. Admit date: (Not on file) Date of Consult: 05/03/2018  Primary Physician: System, Pcp Not In Primary Cardiologist [facility]     Sandra Gibbs is a 22 y.o. female who is being seen today for the [facility]  HPI Sandra Gibbs is a 22 y.o. female who is seen with her grandmother who has a history of palpitations and was seeking a further evaluation.  This is been going on since she was 22 years old.  Notes were reviewed from [facility] which refer to possible atrial tachycardia.  She was given a wearable monitor 5/19 which was complicated by adhesive cellulitis prompting his discontinuation.  She had her typical spells but these data are not yet available.  The episodes are increasngly long, mostly lasting seconds now lasting 5-10 minutes.  They are associated with some lightheadedness and chest discomfort and awareness of her breathing.  They are not related to caffeine which she does not use or chocolate.  Stress is chronic and unchanged.      [facility], and GI thought "very likely a functional process " Cholecystitis s.p lap chole 2016  Her past medical history at [facility] is very unusual and I raised this with her.  This includes postmenopausal, device implantation, organ transplant, HIV etc. etc.  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 CYST REMOVAL TRUNK  2019  . GALLBLADDER SURGERY  2016  . OVARIAN CYST SURGERY  2011  . TONSILLECTOMY       Home Meds: Prior to Admission medications   Medication Sig Start Date End Date Taking? Authorizing Provider  amitriptyline (ELAVIL) 50 MG tablet Take by mouth.   Yes [provider]  dicyclomine (BENTYL) 10 MG capsule  04/19/18  Yes [provider]    etonogestrel-ethinyl estradiol (NUVARING) 0.12-0.015 MG/24HR vaginal ring Leave in place for 3 consecutive weeks, then remove for 1 week. 03/16/18  Yes [provider]    Allergies: No Known Allergies  Social History   Socioeconomic History  . Marital status: Single    Spouse name: Not on file  . Number of children: Not on file  . Years of education: Not on file  . Highest education level: Not on file  Occupational History  . Not on file  Social Needs  . Financial resource strain: Not on file  . Food insecurity:    Worry: Not on file    Inability: Not on file  . Transportation needs:    Medical: Not on file    Non-medical: Not on file  Tobacco Use  . Smoking status: Never Smoker  . Smokeless tobacco: Never Used  Substance and Sexual Activity  . Alcohol use: No  . Drug use: No  . Sexual activity: Not on file  Lifestyle  . Physical activity:    Days per week: Not on file    Minutes per session: Not on file  . Stress: Not on file  Relationships  . Social connections:    Talks on phone: Not on file    Gets together: Not on file    Attends religious service: Not on file    Active member of club or organization: Not on file    Attends meetings of clubs or organizations:  Not on file    Relationship status: Not on file  . Intimate partner violence:    Fear of current or ex partner: Not on file    Emotionally abused: Not on file    Physically abused: Not on file    Forced sexual activity: Not on file  Other Topics Concern  . Not on file  Social History Narrative  . Not on file     Family History  Problem Relation Age of Onset  . Heart murmur Mother   . Mitral valve prolapse Mother   . Heart attack Father   . Atrial fibrillation Maternal Grandfather   . Stroke Paternal Grandmother   . Mitral valve prolapse Paternal Grandmother      ROS:  Please see the history of present illness.     All other systems reviewed and negative.    Physical Exam: Blood  pressure 126/88, pulse 67, height 5' (1.524 m), weight 195 lb (88.5 kg). General: Well developed, well nourished female in no acute distress. Head: Normocephalic, atraumatic, sclera non-icteric, no xanthomas, nares are without discharge. EENT: normal  Lymph Nodes:  none Neck: Negative for carotid bruits. JVD not elevated. Back:without scoliosis kyphosis  Lungs: Clear bilaterally to auscultation without wheezes, rales, or rhonchi. Breathing is unlabored. Heart: RRR with S1 S2. No  murmur . No rubs, or gallops appreciated. Abdomen: Soft, non-tender, non-distended with normoactive bowel sounds. No hepatomegaly. No rebound/guarding. No obvious abdominal masses. Msk:  Strength and tone appear normal for age. Extremities: No clubbing or cyanosis. No edema.  Distal pedal pulses are 2+ and equal bilaterally. Skin: Warm and Dry Neuro: Alert and oriented X 3. CN III-XII intact Grossly normal sensory and motor function . Psych:  Responds to questions appropriately with a normal affect.      Labs: Cardiac Enzymes No results for input(s): CKTOTAL, CKMB, TROPONINI in the last 72 hours. CBC Lab Results  Component Value Date   WBC 8.7 06/24/2016   HGB 14.8 06/24/2016   HCT 42.7 06/24/2016   MCV 83.6 06/24/2016   PLT 421 06/24/2016   PROTIME: No results for input(s): LABPROT, INR in the last 72 hours. Chemistry No results for input(s): NA, K, CL, CO2, BUN, CREATININE, CALCIUM, PROT, BILITOT, ALKPHOS, ALT, AST, GLUCOSE in the last 168 hours.  Invalid input(s): LABALBU Lipids No results found for: CHOL, HDL, LDLCALC, TRIG BNP No results found for: PROBNP Thyroid Function Tests: No results for input(s): TSH, T4TOTAL, T3FREE, THYROIDAB in the last 72 hours.  Invalid input(s): FREET3 Miscellaneous No results found for: DDIMER  Radiology/Studies:  No results found.  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

## 2018-05-21 DIAGNOSIS — F418 Other specified anxiety disorders: Secondary | ICD-10-CM | POA: Diagnosis not present

## 2018-05-21 DIAGNOSIS — M5416 Radiculopathy, lumbar region: Secondary | ICD-10-CM | POA: Diagnosis not present

## 2018-06-29 DIAGNOSIS — K580 Irritable bowel syndrome with diarrhea: Secondary | ICD-10-CM | POA: Diagnosis not present

## 2018-06-29 DIAGNOSIS — F418 Other specified anxiety disorders: Secondary | ICD-10-CM | POA: Diagnosis not present

## 2018-07-03 ENCOUNTER — Telehealth: Payer: Self-pay | Admitting: Internal Medicine

## 2018-07-03 DIAGNOSIS — R002 Palpitations: Secondary | ICD-10-CM

## 2018-07-03 NOTE — Telephone Encounter (Signed)
I called and spoke with the patient. I advised her we have tried to request her heart monitor twice since she was seen.  She is aware that no monitor has been sent to us.  I advised her that [facility], but they were lab work and just a notation stating that she wore a heart monitor on 02/27/18.   The patient confirms she did wear the monitor. She states she recently saw her PCP who is in the [facility] system, and she could not see the tracings from the monitor either nor a summary. It is unclear at this time [facility]   The patient states she has had recent episodes in the last 2 days of palpitations that were associated with dizziness.  She states the last monitor she wore was like a sticker, but it had a USB type port and she had to charge it.  I have advised the patient that I will call down to [facility]ore[facility] [phone] and see if they can confirm for me if there are available tracings to be looked at by Dr. Graciela HusbandsKlein.  I have also advised her that if not, then I will discuss with Dr. Graciela HusbandsKlein if he would want her to wear a ZIO monitor. I have also advised her to look in to the alive cor app.  The patient voices understanding and is agreeable with considering the above 2 options.   She is aware I will call her back by tomorrow at the latest.  I have left a message for the Medical Records department at Dr. Kathi DerMoore's office to please call back and confirm if there are actual monitor tracings available to be obtained.

## 2018-07-03 NOTE — Telephone Encounter (Signed)
Have attempted twice to retrieve the patient's heart monitor [facility]. Will try again.

## 2018-07-03 NOTE — Telephone Encounter (Signed)
Patient calling asking about if we received Monitor results from her previous heart doctor She's not sure if she needs to do another monitor or not Having same symptoms   Please advise

## 2018-07-04 NOTE — Telephone Encounter (Signed)
I left a message for Sandra Gibbs in Medical Records at Dr. Kathi Der office to please call let me know if there are physical heart monitor tracings that would available to be sent if we sent a ROI.

## 2018-07-04 NOTE — Telephone Encounter (Signed)
I spoke with Delaney Meigs in Medical Records at [facility].  She states she spoke with their monitor tech and confirmed there are no strips that were obtained from the monitor the patient wore that could be sent. She had a [employer] monitor applied on 02/27/18. She returned this to the office on 02/28/18 due to complaints that the monitor made her burn/ itch.  Will forward to Dr. Graciela Husbands to review.

## 2018-07-04 NOTE — Telephone Encounter (Signed)
I spoke with Dr. Graciela Husbands regarding this patient.  I advised him of the information I had received from [facility].   He advises that the patient wear a 2 week ZIO/ use the alive cor app.   I called and spoke with the patient. I have advised her of the information I obtained. The patient states she wore the monitor for 3 days and then mailed it back to the company. I advised her that we can order a ZIO for her or she can use the alive cor app.  Per the patient, she is ok with [place] ordering the [facility] monitor. She is aware I will order this and scheduling will call her to arrange.   The patient voices understanding and is agreeable. She is also looking into the [employer].

## 2018-07-05 ENCOUNTER — Telehealth: Payer: Self-pay | Admitting: Internal Medicine

## 2018-07-05 NOTE — Telephone Encounter (Signed)
Lmov for patient to call and schedule °

## 2018-07-05 NOTE — Telephone Encounter (Signed)
-----   Message from Jefferey Pica, RN sent at 07/04/2018  5:13 PM EDT ----- Order placed for a Zio monitor. Please call the patient to schedule at her convenience. Order placed.  Thanks!

## 2018-07-09 NOTE — Telephone Encounter (Signed)
Lmov for patient to call and schedule ZIO

## 2018-07-10 NOTE — Telephone Encounter (Signed)
Lmov for patient to call and schedule ZIO  ° °

## 2018-07-10 NOTE — Telephone Encounter (Signed)
Scheduled 10/3 at 3 pm

## 2018-07-12 ENCOUNTER — Ambulatory Visit (INDEPENDENT_AMBULATORY_CARE_PROVIDER_SITE_OTHER): Payer: Medicaid Other

## 2018-07-12 DIAGNOSIS — R002 Palpitations: Secondary | ICD-10-CM | POA: Diagnosis not present

## 2018-07-16 ENCOUNTER — Telehealth: Payer: Self-pay | Admitting: Internal Medicine

## 2018-07-16 NOTE — Telephone Encounter (Signed)
I called and spoke with the patient. She had her Zio monitor applied Thursday 07/12/18 in the office. She states that she started to have a reaction to the monitor on Thursday night. She woke up in a "panic attack" on Saturday morning about 2 am due to the reaction on her skin. She called [employer] and they advised her to remove the monitor and mail it back. She states that she mailed it back today.  She has tried benadryl, benadryl cream, and hydrocortisone cream to the area where her Zio was applied.  She states these things are helping much. I advised her that it may just take some time for the area to heal. I have advised her to keep this clean, dry, and open to air to allow for continued healing.  She is agreeable and voices understanding.  I inquired if she was able to download the [employer] app. Per the patient, she has not been able to do this due to cost.  She is aware that the only other option at this point to track her rhythm may be a linq, but I am unsure if Dr. Graciela Husbands would want to pursue this for her. She is aware I will review with Dr. Graciela Husbands and call her back this week with further recommendations. She voices understanding and is agreeable.

## 2018-07-16 NOTE — Telephone Encounter (Signed)
Pt states she has had a severe reaction to her monitor (ZIO) she was wearing. States her skin was swollen and red and severe itching. States she has patches of skin that are severly irritated. States she called the company Saturday morning at 2 am. [employer] advised her to remove the monitor and send it back. She wanted to let us know. Pt states she took some Benadryl and Benadryl cream and did not help. Pt states if she does not answer, it is ok to leave a detailed VM.

## 2018-07-19 NOTE — Telephone Encounter (Signed)
I reviewed the information below with Dr. Graciela Husbands. He states that the [employer]ive[employer] monitor would be the only option at this point. He would encourage the patient to try to save up to have the app downloaded to her phone.   I have called and notified the patient of the above. She voices understanding, but states she is still unable to afford this at this time.  She is unsure if she would still be able to catch anything if she were to get the app as her episodes are only 1-2 beats.  I have advised her at this point there is not really another option available to [place] to be able to track what is going on.  She acknowledges this and voices understanding.   I have advised her to call back if she is able to get the Alive Cor app and get any tracings for Dr. Graciela Husbands to review.

## 2018-08-02 DIAGNOSIS — F418 Other specified anxiety disorders: Secondary | ICD-10-CM | POA: Diagnosis not present

## 2018-08-02 DIAGNOSIS — M5416 Radiculopathy, lumbar region: Secondary | ICD-10-CM | POA: Diagnosis not present

## 2018-09-03 DIAGNOSIS — K602 Anal fissure, unspecified: Secondary | ICD-10-CM | POA: Diagnosis not present

## 2018-09-03 DIAGNOSIS — K529 Noninfective gastroenteritis and colitis, unspecified: Secondary | ICD-10-CM | POA: Diagnosis not present

## 2018-09-03 DIAGNOSIS — Z808 Family history of malignant neoplasm of other organs or systems: Secondary | ICD-10-CM | POA: Diagnosis not present

## 2018-09-13 DIAGNOSIS — D485 Neoplasm of uncertain behavior of skin: Secondary | ICD-10-CM | POA: Diagnosis not present

## 2018-09-13 DIAGNOSIS — L814 Other melanin hyperpigmentation: Secondary | ICD-10-CM | POA: Diagnosis not present

## 2018-09-13 DIAGNOSIS — D229 Melanocytic nevi, unspecified: Secondary | ICD-10-CM | POA: Diagnosis not present

## 2018-09-13 DIAGNOSIS — D2362 Other benign neoplasm of skin of left upper limb, including shoulder: Secondary | ICD-10-CM | POA: Diagnosis not present

## 2018-10-08 DIAGNOSIS — F410 Panic disorder [episodic paroxysmal anxiety] without agoraphobia: Secondary | ICD-10-CM | POA: Diagnosis not present

## 2018-10-08 DIAGNOSIS — F411 Generalized anxiety disorder: Secondary | ICD-10-CM | POA: Diagnosis not present

## 2018-10-08 DIAGNOSIS — N760 Acute vaginitis: Secondary | ICD-10-CM | POA: Diagnosis not present

## 2018-10-08 DIAGNOSIS — K529 Noninfective gastroenteritis and colitis, unspecified: Secondary | ICD-10-CM | POA: Diagnosis not present

## 2018-10-24 DIAGNOSIS — R300 Dysuria: Secondary | ICD-10-CM | POA: Diagnosis not present

## 2018-10-24 DIAGNOSIS — N3001 Acute cystitis with hematuria: Secondary | ICD-10-CM | POA: Diagnosis not present

## 2018-11-02 DIAGNOSIS — N760 Acute vaginitis: Secondary | ICD-10-CM | POA: Diagnosis not present

## 2018-11-05 DIAGNOSIS — R1030 Lower abdominal pain, unspecified: Secondary | ICD-10-CM | POA: Diagnosis not present

## 2018-11-05 DIAGNOSIS — N29 Other disorders of kidney and ureter in diseases classified elsewhere: Secondary | ICD-10-CM | POA: Diagnosis not present

## 2018-11-05 DIAGNOSIS — N200 Calculus of kidney: Secondary | ICD-10-CM | POA: Diagnosis not present

## 2018-11-05 DIAGNOSIS — R112 Nausea with vomiting, unspecified: Secondary | ICD-10-CM | POA: Diagnosis not present

## 2018-11-05 DIAGNOSIS — R300 Dysuria: Secondary | ICD-10-CM | POA: Diagnosis not present

## 2018-11-05 DIAGNOSIS — N210 Calculus in bladder: Secondary | ICD-10-CM | POA: Diagnosis not present

## 2018-11-13 DIAGNOSIS — N200 Calculus of kidney: Secondary | ICD-10-CM | POA: Diagnosis not present

## 2018-11-13 DIAGNOSIS — N201 Calculus of ureter: Secondary | ICD-10-CM | POA: Diagnosis not present

## 2018-11-13 DIAGNOSIS — R300 Dysuria: Secondary | ICD-10-CM | POA: Diagnosis not present

## 2018-11-22 DIAGNOSIS — F411 Generalized anxiety disorder: Secondary | ICD-10-CM | POA: Diagnosis not present

## 2018-11-22 DIAGNOSIS — F410 Panic disorder [episodic paroxysmal anxiety] without agoraphobia: Secondary | ICD-10-CM | POA: Diagnosis not present

## 2018-11-22 DIAGNOSIS — L290 Pruritus ani: Secondary | ICD-10-CM | POA: Diagnosis not present

## 2018-11-23 DIAGNOSIS — J069 Acute upper respiratory infection, unspecified: Secondary | ICD-10-CM | POA: Diagnosis not present

## 2018-11-27 DIAGNOSIS — L03031 Cellulitis of right toe: Secondary | ICD-10-CM | POA: Diagnosis not present

## 2018-12-05 DIAGNOSIS — N202 Calculus of kidney with calculus of ureter: Secondary | ICD-10-CM | POA: Diagnosis not present

## 2018-12-05 DIAGNOSIS — N200 Calculus of kidney: Secondary | ICD-10-CM | POA: Diagnosis not present

## 2018-12-07 DIAGNOSIS — N200 Calculus of kidney: Secondary | ICD-10-CM | POA: Diagnosis not present

## 2018-12-07 DIAGNOSIS — N201 Calculus of ureter: Secondary | ICD-10-CM | POA: Diagnosis not present

## 2018-12-11 DIAGNOSIS — Z9889 Other specified postprocedural states: Secondary | ICD-10-CM | POA: Diagnosis not present

## 2018-12-11 DIAGNOSIS — R109 Unspecified abdominal pain: Secondary | ICD-10-CM | POA: Diagnosis not present

## 2018-12-11 DIAGNOSIS — N200 Calculus of kidney: Secondary | ICD-10-CM | POA: Diagnosis not present

## 2018-12-11 DIAGNOSIS — N29 Other disorders of kidney and ureter in diseases classified elsewhere: Secondary | ICD-10-CM | POA: Diagnosis not present

## 2018-12-11 DIAGNOSIS — T8384XA Pain from genitourinary prosthetic devices, implants and grafts, initial encounter: Secondary | ICD-10-CM | POA: Diagnosis not present

## 2018-12-12 DIAGNOSIS — N29 Other disorders of kidney and ureter in diseases classified elsewhere: Secondary | ICD-10-CM | POA: Diagnosis not present

## 2018-12-12 DIAGNOSIS — T8384XA Pain from genitourinary prosthetic devices, implants and grafts, initial encounter: Secondary | ICD-10-CM | POA: Diagnosis not present

## 2018-12-12 DIAGNOSIS — N200 Calculus of kidney: Secondary | ICD-10-CM | POA: Diagnosis not present

## 2018-12-12 DIAGNOSIS — Z9889 Other specified postprocedural states: Secondary | ICD-10-CM | POA: Diagnosis not present

## 2018-12-18 DIAGNOSIS — N201 Calculus of ureter: Secondary | ICD-10-CM | POA: Diagnosis not present

## 2019-01-04 DIAGNOSIS — F410 Panic disorder [episodic paroxysmal anxiety] without agoraphobia: Secondary | ICD-10-CM | POA: Diagnosis not present

## 2019-01-04 DIAGNOSIS — F411 Generalized anxiety disorder: Secondary | ICD-10-CM | POA: Diagnosis not present

## 2019-01-15 DIAGNOSIS — N201 Calculus of ureter: Secondary | ICD-10-CM | POA: Diagnosis not present

## 2019-01-15 DIAGNOSIS — N200 Calculus of kidney: Secondary | ICD-10-CM | POA: Diagnosis not present

## 2019-02-12 DIAGNOSIS — F410 Panic disorder [episodic paroxysmal anxiety] without agoraphobia: Secondary | ICD-10-CM | POA: Diagnosis not present

## 2019-02-12 DIAGNOSIS — F411 Generalized anxiety disorder: Secondary | ICD-10-CM | POA: Diagnosis not present

## 2019-03-12 DIAGNOSIS — L282 Other prurigo: Secondary | ICD-10-CM | POA: Diagnosis not present

## 2019-03-12 DIAGNOSIS — S161XXA Strain of muscle, fascia and tendon at neck level, initial encounter: Secondary | ICD-10-CM | POA: Diagnosis not present

## 2019-03-12 DIAGNOSIS — K529 Noninfective gastroenteritis and colitis, unspecified: Secondary | ICD-10-CM | POA: Diagnosis not present

## 2019-06-19 DIAGNOSIS — F410 Panic disorder [episodic paroxysmal anxiety] without agoraphobia: Secondary | ICD-10-CM | POA: Diagnosis not present

## 2019-06-19 DIAGNOSIS — F411 Generalized anxiety disorder: Secondary | ICD-10-CM | POA: Diagnosis not present

## 2019-06-19 DIAGNOSIS — R1310 Dysphagia, unspecified: Secondary | ICD-10-CM | POA: Diagnosis not present

## 2019-07-19 DIAGNOSIS — R238 Other skin changes: Secondary | ICD-10-CM | POA: Diagnosis not present

## 2019-07-19 DIAGNOSIS — F411 Generalized anxiety disorder: Secondary | ICD-10-CM | POA: Diagnosis not present

## 2019-07-19 DIAGNOSIS — T148XXA Other injury of unspecified body region, initial encounter: Secondary | ICD-10-CM | POA: Diagnosis not present

## 2019-07-19 DIAGNOSIS — G8929 Other chronic pain: Secondary | ICD-10-CM | POA: Diagnosis not present

## 2019-07-19 DIAGNOSIS — M25562 Pain in left knee: Secondary | ICD-10-CM | POA: Diagnosis not present

## 2019-07-19 DIAGNOSIS — B373 Candidiasis of vulva and vagina: Secondary | ICD-10-CM | POA: Diagnosis not present

## 2019-07-19 DIAGNOSIS — Z79899 Other long term (current) drug therapy: Secondary | ICD-10-CM | POA: Diagnosis not present

## 2019-07-19 DIAGNOSIS — R1310 Dysphagia, unspecified: Secondary | ICD-10-CM | POA: Diagnosis not present

## 2019-07-19 DIAGNOSIS — R1319 Other dysphagia: Secondary | ICD-10-CM | POA: Diagnosis not present

## 2019-07-23 DIAGNOSIS — N201 Calculus of ureter: Secondary | ICD-10-CM | POA: Diagnosis not present

## 2019-07-26 DIAGNOSIS — M222X2 Patellofemoral disorders, left knee: Secondary | ICD-10-CM | POA: Diagnosis not present

## 2019-07-30 DIAGNOSIS — M25562 Pain in left knee: Secondary | ICD-10-CM | POA: Diagnosis not present

## 2019-08-02 DIAGNOSIS — R1115 Cyclical vomiting syndrome unrelated to migraine: Secondary | ICD-10-CM | POA: Diagnosis not present

## 2019-08-13 DIAGNOSIS — F411 Generalized anxiety disorder: Secondary | ICD-10-CM | POA: Diagnosis present

## 2019-08-13 DIAGNOSIS — F331 Major depressive disorder, recurrent, moderate: Secondary | ICD-10-CM | POA: Insufficient documentation

## 2019-08-14 DIAGNOSIS — M9903 Segmental and somatic dysfunction of lumbar region: Secondary | ICD-10-CM | POA: Diagnosis not present

## 2019-08-14 DIAGNOSIS — M9901 Segmental and somatic dysfunction of cervical region: Secondary | ICD-10-CM | POA: Diagnosis not present

## 2019-08-14 DIAGNOSIS — M530 Cervicocranial syndrome: Secondary | ICD-10-CM | POA: Diagnosis not present

## 2019-08-14 DIAGNOSIS — M461 Sacroiliitis, not elsewhere classified: Secondary | ICD-10-CM | POA: Diagnosis not present

## 2019-08-14 DIAGNOSIS — M9905 Segmental and somatic dysfunction of pelvic region: Secondary | ICD-10-CM | POA: Diagnosis not present

## 2019-08-14 DIAGNOSIS — M5442 Lumbago with sciatica, left side: Secondary | ICD-10-CM | POA: Diagnosis not present

## 2019-08-15 DIAGNOSIS — M5442 Lumbago with sciatica, left side: Secondary | ICD-10-CM | POA: Diagnosis not present

## 2019-08-15 DIAGNOSIS — M9905 Segmental and somatic dysfunction of pelvic region: Secondary | ICD-10-CM | POA: Diagnosis not present

## 2019-08-15 DIAGNOSIS — M461 Sacroiliitis, not elsewhere classified: Secondary | ICD-10-CM | POA: Diagnosis not present

## 2019-08-15 DIAGNOSIS — M530 Cervicocranial syndrome: Secondary | ICD-10-CM | POA: Diagnosis not present

## 2019-08-15 DIAGNOSIS — M25562 Pain in left knee: Secondary | ICD-10-CM | POA: Diagnosis not present

## 2019-08-15 DIAGNOSIS — M9903 Segmental and somatic dysfunction of lumbar region: Secondary | ICD-10-CM | POA: Diagnosis not present

## 2019-08-15 DIAGNOSIS — M9901 Segmental and somatic dysfunction of cervical region: Secondary | ICD-10-CM | POA: Diagnosis not present

## 2019-08-20 DIAGNOSIS — M461 Sacroiliitis, not elsewhere classified: Secondary | ICD-10-CM | POA: Diagnosis not present

## 2019-08-20 DIAGNOSIS — M9903 Segmental and somatic dysfunction of lumbar region: Secondary | ICD-10-CM | POA: Diagnosis not present

## 2019-08-20 DIAGNOSIS — M9905 Segmental and somatic dysfunction of pelvic region: Secondary | ICD-10-CM | POA: Diagnosis not present

## 2019-08-20 DIAGNOSIS — M530 Cervicocranial syndrome: Secondary | ICD-10-CM | POA: Diagnosis not present

## 2019-08-20 DIAGNOSIS — M9901 Segmental and somatic dysfunction of cervical region: Secondary | ICD-10-CM | POA: Diagnosis not present

## 2019-08-20 DIAGNOSIS — M5442 Lumbago with sciatica, left side: Secondary | ICD-10-CM | POA: Diagnosis not present

## 2019-08-22 DIAGNOSIS — M530 Cervicocranial syndrome: Secondary | ICD-10-CM | POA: Diagnosis not present

## 2019-08-22 DIAGNOSIS — M461 Sacroiliitis, not elsewhere classified: Secondary | ICD-10-CM | POA: Diagnosis not present

## 2019-08-22 DIAGNOSIS — M9901 Segmental and somatic dysfunction of cervical region: Secondary | ICD-10-CM | POA: Diagnosis not present

## 2019-08-22 DIAGNOSIS — M5442 Lumbago with sciatica, left side: Secondary | ICD-10-CM | POA: Diagnosis not present

## 2019-08-22 DIAGNOSIS — M9905 Segmental and somatic dysfunction of pelvic region: Secondary | ICD-10-CM | POA: Diagnosis not present

## 2019-08-22 DIAGNOSIS — M9903 Segmental and somatic dysfunction of lumbar region: Secondary | ICD-10-CM | POA: Diagnosis not present

## 2019-08-28 DIAGNOSIS — M9903 Segmental and somatic dysfunction of lumbar region: Secondary | ICD-10-CM | POA: Diagnosis not present

## 2019-08-28 DIAGNOSIS — M530 Cervicocranial syndrome: Secondary | ICD-10-CM | POA: Diagnosis not present

## 2019-08-28 DIAGNOSIS — M9905 Segmental and somatic dysfunction of pelvic region: Secondary | ICD-10-CM | POA: Diagnosis not present

## 2019-08-28 DIAGNOSIS — M461 Sacroiliitis, not elsewhere classified: Secondary | ICD-10-CM | POA: Diagnosis not present

## 2019-08-28 DIAGNOSIS — M9901 Segmental and somatic dysfunction of cervical region: Secondary | ICD-10-CM | POA: Diagnosis not present

## 2019-08-28 DIAGNOSIS — M5442 Lumbago with sciatica, left side: Secondary | ICD-10-CM | POA: Diagnosis not present

## 2019-08-30 DIAGNOSIS — Z01812 Encounter for preprocedural laboratory examination: Secondary | ICD-10-CM | POA: Diagnosis not present

## 2019-08-30 DIAGNOSIS — R6889 Other general symptoms and signs: Secondary | ICD-10-CM | POA: Diagnosis not present

## 2019-08-30 DIAGNOSIS — R1312 Dysphagia, oropharyngeal phase: Secondary | ICD-10-CM | POA: Diagnosis not present

## 2019-09-02 DIAGNOSIS — M9901 Segmental and somatic dysfunction of cervical region: Secondary | ICD-10-CM | POA: Diagnosis not present

## 2019-09-02 DIAGNOSIS — M530 Cervicocranial syndrome: Secondary | ICD-10-CM | POA: Diagnosis not present

## 2019-09-02 DIAGNOSIS — M5442 Lumbago with sciatica, left side: Secondary | ICD-10-CM | POA: Diagnosis not present

## 2019-09-02 DIAGNOSIS — M461 Sacroiliitis, not elsewhere classified: Secondary | ICD-10-CM | POA: Diagnosis not present

## 2019-09-02 DIAGNOSIS — M9903 Segmental and somatic dysfunction of lumbar region: Secondary | ICD-10-CM | POA: Diagnosis not present

## 2019-09-02 DIAGNOSIS — M9905 Segmental and somatic dysfunction of pelvic region: Secondary | ICD-10-CM | POA: Diagnosis not present

## 2019-09-06 DIAGNOSIS — M25562 Pain in left knee: Secondary | ICD-10-CM | POA: Diagnosis not present

## 2019-09-10 DIAGNOSIS — M222X2 Patellofemoral disorders, left knee: Secondary | ICD-10-CM | POA: Insufficient documentation

## 2019-09-10 DIAGNOSIS — M949 Disorder of cartilage, unspecified: Secondary | ICD-10-CM | POA: Diagnosis not present

## 2019-09-12 DIAGNOSIS — L819 Disorder of pigmentation, unspecified: Secondary | ICD-10-CM | POA: Diagnosis not present

## 2019-09-12 DIAGNOSIS — D485 Neoplasm of uncertain behavior of skin: Secondary | ICD-10-CM | POA: Diagnosis not present

## 2019-09-12 DIAGNOSIS — N200 Calculus of kidney: Secondary | ICD-10-CM | POA: Diagnosis not present

## 2019-09-12 DIAGNOSIS — L910 Hypertrophic scar: Secondary | ICD-10-CM | POA: Diagnosis not present

## 2019-09-12 DIAGNOSIS — L290 Pruritus ani: Secondary | ICD-10-CM | POA: Diagnosis not present

## 2019-09-12 DIAGNOSIS — K580 Irritable bowel syndrome with diarrhea: Secondary | ICD-10-CM | POA: Diagnosis not present

## 2019-09-12 DIAGNOSIS — L918 Other hypertrophic disorders of the skin: Secondary | ICD-10-CM | POA: Diagnosis not present

## 2019-09-12 DIAGNOSIS — D489 Neoplasm of uncertain behavior, unspecified: Secondary | ICD-10-CM | POA: Diagnosis not present

## 2019-09-12 DIAGNOSIS — D229 Melanocytic nevi, unspecified: Secondary | ICD-10-CM | POA: Diagnosis not present

## 2019-09-12 DIAGNOSIS — Q615 Medullary cystic kidney: Secondary | ICD-10-CM | POA: Diagnosis not present

## 2019-09-12 DIAGNOSIS — L814 Other melanin hyperpigmentation: Secondary | ICD-10-CM | POA: Diagnosis not present

## 2019-09-23 DIAGNOSIS — Z23 Encounter for immunization: Secondary | ICD-10-CM | POA: Diagnosis not present

## 2019-09-23 DIAGNOSIS — Z111 Encounter for screening for respiratory tuberculosis: Secondary | ICD-10-CM | POA: Diagnosis not present

## 2019-10-14 DIAGNOSIS — Z20828 Contact with and (suspected) exposure to other viral communicable diseases: Secondary | ICD-10-CM | POA: Diagnosis not present

## 2019-10-14 DIAGNOSIS — Z01812 Encounter for preprocedural laboratory examination: Secondary | ICD-10-CM | POA: Diagnosis not present

## 2019-10-16 DIAGNOSIS — R1310 Dysphagia, unspecified: Secondary | ICD-10-CM | POA: Diagnosis not present

## 2019-10-16 DIAGNOSIS — K449 Diaphragmatic hernia without obstruction or gangrene: Secondary | ICD-10-CM | POA: Diagnosis not present

## 2019-11-14 DIAGNOSIS — R41840 Attention and concentration deficit: Secondary | ICD-10-CM | POA: Diagnosis not present

## 2019-11-14 DIAGNOSIS — F988 Other specified behavioral and emotional disorders with onset usually occurring in childhood and adolescence: Secondary | ICD-10-CM | POA: Diagnosis not present

## 2019-11-14 DIAGNOSIS — Q615 Medullary cystic kidney: Secondary | ICD-10-CM | POA: Diagnosis not present

## 2019-12-07 DIAGNOSIS — K589 Irritable bowel syndrome without diarrhea: Secondary | ICD-10-CM | POA: Diagnosis not present

## 2019-12-07 DIAGNOSIS — Q615 Medullary cystic kidney: Secondary | ICD-10-CM | POA: Diagnosis not present

## 2019-12-07 DIAGNOSIS — N200 Calculus of kidney: Secondary | ICD-10-CM | POA: Insufficient documentation

## 2019-12-07 DIAGNOSIS — F331 Major depressive disorder, recurrent, moderate: Secondary | ICD-10-CM | POA: Diagnosis not present

## 2019-12-07 DIAGNOSIS — G4733 Obstructive sleep apnea (adult) (pediatric): Secondary | ICD-10-CM | POA: Diagnosis not present

## 2019-12-07 DIAGNOSIS — R109 Unspecified abdominal pain: Secondary | ICD-10-CM | POA: Diagnosis not present

## 2019-12-07 DIAGNOSIS — R1110 Vomiting, unspecified: Secondary | ICD-10-CM | POA: Diagnosis not present

## 2019-12-07 DIAGNOSIS — N29 Other disorders of kidney and ureter in diseases classified elsewhere: Secondary | ICD-10-CM | POA: Diagnosis not present

## 2019-12-08 DIAGNOSIS — R109 Unspecified abdominal pain: Secondary | ICD-10-CM | POA: Diagnosis not present

## 2019-12-08 DIAGNOSIS — N29 Other disorders of kidney and ureter in diseases classified elsewhere: Secondary | ICD-10-CM | POA: Diagnosis not present

## 2019-12-08 DIAGNOSIS — G4733 Obstructive sleep apnea (adult) (pediatric): Secondary | ICD-10-CM | POA: Diagnosis not present

## 2019-12-08 DIAGNOSIS — F331 Major depressive disorder, recurrent, moderate: Secondary | ICD-10-CM | POA: Diagnosis not present

## 2019-12-08 DIAGNOSIS — K589 Irritable bowel syndrome without diarrhea: Secondary | ICD-10-CM | POA: Diagnosis not present

## 2019-12-08 DIAGNOSIS — Q615 Medullary cystic kidney: Secondary | ICD-10-CM | POA: Diagnosis not present

## 2019-12-16 ENCOUNTER — Ambulatory Visit: Payer: Medicaid Other

## 2020-01-02 DIAGNOSIS — R1312 Dysphagia, oropharyngeal phase: Secondary | ICD-10-CM | POA: Diagnosis not present

## 2020-01-06 ENCOUNTER — Ambulatory Visit: Payer: Medicaid Other | Attending: Internal Medicine

## 2020-01-06 DIAGNOSIS — Z23 Encounter for immunization: Secondary | ICD-10-CM

## 2020-01-06 NOTE — Progress Notes (Signed)
   Covid-19 Vaccination Clinic  Name:  LANAI CONLEE    MRN: [id number] DOB: October 14, 1995  01/06/2020  Ms. Dorinda Hill was observed post Covid-19 immunization for 15 minutes without incident. She was provided with Vaccine Information Sheet and instruction to access the V-Safe system.   Ms. Dorinda Hill was instructed to call 911 with any severe reactions post vaccine: Marland Kitchen Difficulty breathing  . Swelling of face and throat  . A fast heartbeat  . A bad rash all over body  . Dizziness and weakness   Immunizations Administered    Name Date Dose VIS Date Route   Pfizer COVID-19 Vaccine 01/06/2020 10:53 AM 0.3 mL 09/20/2019 Intramuscular   Manufacturer: [employer], [employer]   Lot: [id number]   NDC: [address]-5

## 2020-01-07 ENCOUNTER — Ambulatory Visit: Payer: Medicaid Other

## 2020-01-16 DIAGNOSIS — N200 Calculus of kidney: Secondary | ICD-10-CM | POA: Diagnosis not present

## 2020-01-16 DIAGNOSIS — Q615 Medullary cystic kidney: Secondary | ICD-10-CM | POA: Diagnosis not present

## 2020-01-29 ENCOUNTER — Ambulatory Visit: Payer: Medicaid Other | Attending: Internal Medicine

## 2020-01-29 DIAGNOSIS — Z23 Encounter for immunization: Secondary | ICD-10-CM

## 2020-01-29 NOTE — Progress Notes (Signed)
   Covid-19 Vaccination Clinic  Name:  Sandra Gibbs    MRN: [id number] DOB: 04/08/1996  01/29/2020  [name] was observed post Covid-19 immunization for 15 minutes without incident. She was provided with Vaccine Information Sheet and instruction to access the V-Safe system.   [name] was instructed to call 911 with any severe reactions post vaccine: Marland Kitchen Difficulty breathing  . Swelling of face and throat  . A fast heartbeat  . A bad rash all over body  . Dizziness and weakness   Immunizations Administered    Name Date Dose VIS Date Route   Pfizer COVID-19 Vaccine 01/29/2020 12:30 PM 0.3 mL 12/04/2018 Intramuscular   Manufacturer: [employer], [employer]   Lot: [id number]   NDC: [address]-8

## 2020-02-07 DIAGNOSIS — F900 Attention-deficit hyperactivity disorder, predominantly inattentive type: Secondary | ICD-10-CM | POA: Diagnosis present

## 2020-02-11 DIAGNOSIS — Y9302 Activity, running: Secondary | ICD-10-CM | POA: Diagnosis not present

## 2020-02-11 DIAGNOSIS — W1839XA Other fall on same level, initial encounter: Secondary | ICD-10-CM | POA: Diagnosis not present

## 2020-02-11 DIAGNOSIS — S8992XA Unspecified injury of left lower leg, initial encounter: Secondary | ICD-10-CM | POA: Diagnosis not present

## 2020-02-13 DIAGNOSIS — Q615 Medullary cystic kidney: Secondary | ICD-10-CM | POA: Diagnosis not present

## 2020-02-13 DIAGNOSIS — M2352 Chronic instability of knee, left knee: Secondary | ICD-10-CM | POA: Diagnosis not present

## 2020-02-13 DIAGNOSIS — Z113 Encounter for screening for infections with a predominantly sexual mode of transmission: Secondary | ICD-10-CM | POA: Diagnosis not present

## 2020-02-13 DIAGNOSIS — M25362 Other instability, left knee: Secondary | ICD-10-CM | POA: Diagnosis not present

## 2020-02-13 DIAGNOSIS — G8929 Other chronic pain: Secondary | ICD-10-CM | POA: Diagnosis not present

## 2020-02-13 DIAGNOSIS — M25562 Pain in left knee: Secondary | ICD-10-CM | POA: Diagnosis not present

## 2020-02-18 DIAGNOSIS — M2242 Chondromalacia patellae, left knee: Secondary | ICD-10-CM | POA: Diagnosis not present

## 2020-02-18 DIAGNOSIS — Z1159 Encounter for screening for other viral diseases: Secondary | ICD-10-CM | POA: Diagnosis not present

## 2020-02-25 DIAGNOSIS — Z20822 Contact with and (suspected) exposure to covid-19: Secondary | ICD-10-CM | POA: Diagnosis not present

## 2020-02-28 DIAGNOSIS — M948X6 Other specified disorders of cartilage, lower leg: Secondary | ICD-10-CM | POA: Diagnosis not present

## 2020-02-28 DIAGNOSIS — M238X2 Other internal derangements of left knee: Secondary | ICD-10-CM | POA: Diagnosis not present

## 2020-02-28 DIAGNOSIS — M2242 Chondromalacia patellae, left knee: Secondary | ICD-10-CM | POA: Diagnosis not present

## 2020-03-24 DIAGNOSIS — Z30430 Encounter for insertion of intrauterine contraceptive device: Secondary | ICD-10-CM | POA: Diagnosis not present

## 2020-03-24 DIAGNOSIS — Z3202 Encounter for pregnancy test, result negative: Secondary | ICD-10-CM | POA: Diagnosis not present

## 2020-04-20 DIAGNOSIS — F411 Generalized anxiety disorder: Secondary | ICD-10-CM | POA: Diagnosis not present

## 2020-04-20 DIAGNOSIS — F331 Major depressive disorder, recurrent, moderate: Secondary | ICD-10-CM | POA: Diagnosis not present

## 2020-04-21 DIAGNOSIS — F331 Major depressive disorder, recurrent, moderate: Secondary | ICD-10-CM | POA: Diagnosis not present

## 2020-05-25 DIAGNOSIS — F900 Attention-deficit hyperactivity disorder, predominantly inattentive type: Secondary | ICD-10-CM | POA: Diagnosis not present

## 2020-05-25 DIAGNOSIS — F410 Panic disorder [episodic paroxysmal anxiety] without agoraphobia: Secondary | ICD-10-CM | POA: Diagnosis not present

## 2020-05-25 DIAGNOSIS — F331 Major depressive disorder, recurrent, moderate: Secondary | ICD-10-CM | POA: Diagnosis not present

## 2020-05-25 DIAGNOSIS — F411 Generalized anxiety disorder: Secondary | ICD-10-CM | POA: Diagnosis not present

## 2020-06-02 DIAGNOSIS — M25562 Pain in left knee: Secondary | ICD-10-CM | POA: Diagnosis not present

## 2020-06-02 DIAGNOSIS — M2242 Chondromalacia patellae, left knee: Secondary | ICD-10-CM | POA: Diagnosis not present

## 2020-06-03 DIAGNOSIS — M25562 Pain in left knee: Secondary | ICD-10-CM | POA: Diagnosis not present

## 2020-06-10 DIAGNOSIS — M2392 Unspecified internal derangement of left knee: Secondary | ICD-10-CM | POA: Diagnosis not present

## 2020-06-11 DIAGNOSIS — N200 Calculus of kidney: Secondary | ICD-10-CM | POA: Diagnosis not present

## 2020-06-11 DIAGNOSIS — R61 Generalized hyperhidrosis: Secondary | ICD-10-CM | POA: Diagnosis not present

## 2020-06-11 DIAGNOSIS — R635 Abnormal weight gain: Secondary | ICD-10-CM | POA: Diagnosis not present

## 2020-06-11 DIAGNOSIS — Q615 Medullary cystic kidney: Secondary | ICD-10-CM | POA: Diagnosis not present

## 2020-06-25 DIAGNOSIS — M7122 Synovial cyst of popliteal space [Baker], left knee: Secondary | ICD-10-CM | POA: Diagnosis not present

## 2020-06-25 DIAGNOSIS — S8992XA Unspecified injury of left lower leg, initial encounter: Secondary | ICD-10-CM | POA: Diagnosis not present

## 2020-06-25 DIAGNOSIS — M2392 Unspecified internal derangement of left knee: Secondary | ICD-10-CM | POA: Diagnosis not present

## 2020-06-29 DIAGNOSIS — M238X2 Other internal derangements of left knee: Secondary | ICD-10-CM | POA: Diagnosis not present

## 2020-07-02 DIAGNOSIS — R635 Abnormal weight gain: Secondary | ICD-10-CM | POA: Diagnosis not present

## 2020-07-02 DIAGNOSIS — R7302 Impaired glucose tolerance (oral): Secondary | ICD-10-CM | POA: Diagnosis not present

## 2020-07-06 DIAGNOSIS — M2392 Unspecified internal derangement of left knee: Secondary | ICD-10-CM | POA: Diagnosis not present

## 2020-07-06 DIAGNOSIS — M25562 Pain in left knee: Secondary | ICD-10-CM | POA: Diagnosis not present

## 2020-07-15 DIAGNOSIS — R110 Nausea: Secondary | ICD-10-CM | POA: Diagnosis not present

## 2020-07-15 DIAGNOSIS — N200 Calculus of kidney: Secondary | ICD-10-CM | POA: Diagnosis not present

## 2020-07-15 DIAGNOSIS — Q615 Medullary cystic kidney: Secondary | ICD-10-CM | POA: Diagnosis not present

## 2020-07-15 DIAGNOSIS — R109 Unspecified abdominal pain: Secondary | ICD-10-CM | POA: Diagnosis not present

## 2020-07-20 DIAGNOSIS — N200 Calculus of kidney: Secondary | ICD-10-CM | POA: Diagnosis not present

## 2020-07-20 DIAGNOSIS — R350 Frequency of micturition: Secondary | ICD-10-CM | POA: Diagnosis not present

## 2020-07-20 DIAGNOSIS — R112 Nausea with vomiting, unspecified: Secondary | ICD-10-CM | POA: Diagnosis not present

## 2020-07-20 DIAGNOSIS — Z87442 Personal history of urinary calculi: Secondary | ICD-10-CM | POA: Diagnosis not present

## 2020-07-20 DIAGNOSIS — R1030 Lower abdominal pain, unspecified: Secondary | ICD-10-CM | POA: Diagnosis not present

## 2020-07-20 DIAGNOSIS — N12 Tubulo-interstitial nephritis, not specified as acute or chronic: Secondary | ICD-10-CM | POA: Diagnosis not present

## 2020-07-20 DIAGNOSIS — R110 Nausea: Secondary | ICD-10-CM | POA: Diagnosis not present

## 2020-07-20 DIAGNOSIS — R42 Dizziness and giddiness: Secondary | ICD-10-CM | POA: Diagnosis not present

## 2020-07-20 DIAGNOSIS — Q615 Medullary cystic kidney: Secondary | ICD-10-CM | POA: Diagnosis not present

## 2020-07-21 DIAGNOSIS — M238X2 Other internal derangements of left knee: Secondary | ICD-10-CM | POA: Insufficient documentation

## 2020-07-28 DIAGNOSIS — Z01812 Encounter for preprocedural laboratory examination: Secondary | ICD-10-CM | POA: Diagnosis not present

## 2020-07-28 DIAGNOSIS — M238X2 Other internal derangements of left knee: Secondary | ICD-10-CM | POA: Diagnosis not present

## 2020-07-30 DIAGNOSIS — G8918 Other acute postprocedural pain: Secondary | ICD-10-CM | POA: Diagnosis not present

## 2020-07-30 DIAGNOSIS — M238X2 Other internal derangements of left knee: Secondary | ICD-10-CM | POA: Diagnosis not present

## 2020-07-30 DIAGNOSIS — M222X2 Patellofemoral disorders, left knee: Secondary | ICD-10-CM | POA: Diagnosis not present

## 2020-07-31 DIAGNOSIS — M238X2 Other internal derangements of left knee: Secondary | ICD-10-CM | POA: Diagnosis not present

## 2020-07-31 DIAGNOSIS — M222X2 Patellofemoral disorders, left knee: Secondary | ICD-10-CM | POA: Diagnosis not present

## 2020-08-01 DIAGNOSIS — M25462 Effusion, left knee: Secondary | ICD-10-CM | POA: Diagnosis not present

## 2020-08-01 DIAGNOSIS — G8928 Other chronic postprocedural pain: Secondary | ICD-10-CM | POA: Diagnosis not present

## 2020-08-05 DIAGNOSIS — M25562 Pain in left knee: Secondary | ICD-10-CM | POA: Diagnosis not present

## 2020-08-05 DIAGNOSIS — R2689 Other abnormalities of gait and mobility: Secondary | ICD-10-CM | POA: Diagnosis not present

## 2020-08-26 DIAGNOSIS — N200 Calculus of kidney: Secondary | ICD-10-CM | POA: Diagnosis not present

## 2020-08-26 DIAGNOSIS — R109 Unspecified abdominal pain: Secondary | ICD-10-CM | POA: Diagnosis not present

## 2020-09-09 DIAGNOSIS — M2392 Unspecified internal derangement of left knee: Secondary | ICD-10-CM | POA: Diagnosis not present

## 2020-09-24 DIAGNOSIS — Q615 Medullary cystic kidney: Secondary | ICD-10-CM | POA: Diagnosis not present

## 2020-09-24 DIAGNOSIS — Z87442 Personal history of urinary calculi: Secondary | ICD-10-CM | POA: Diagnosis not present

## 2020-09-24 DIAGNOSIS — R109 Unspecified abdominal pain: Secondary | ICD-10-CM | POA: Diagnosis not present

## 2020-09-24 DIAGNOSIS — N3001 Acute cystitis with hematuria: Secondary | ICD-10-CM | POA: Diagnosis not present

## 2020-09-24 DIAGNOSIS — N200 Calculus of kidney: Secondary | ICD-10-CM | POA: Diagnosis not present

## 2020-09-24 DIAGNOSIS — Z20822 Contact with and (suspected) exposure to covid-19: Secondary | ICD-10-CM | POA: Diagnosis not present

## 2020-09-25 DIAGNOSIS — Q615 Medullary cystic kidney: Secondary | ICD-10-CM | POA: Diagnosis not present

## 2020-09-25 DIAGNOSIS — N200 Calculus of kidney: Secondary | ICD-10-CM | POA: Diagnosis not present

## 2020-09-27 DIAGNOSIS — F900 Attention-deficit hyperactivity disorder, predominantly inattentive type: Secondary | ICD-10-CM | POA: Diagnosis not present

## 2020-09-28 DIAGNOSIS — F411 Generalized anxiety disorder: Secondary | ICD-10-CM | POA: Diagnosis not present

## 2020-09-28 DIAGNOSIS — F410 Panic disorder [episodic paroxysmal anxiety] without agoraphobia: Secondary | ICD-10-CM | POA: Diagnosis not present

## 2020-10-10 ENCOUNTER — Ambulatory Visit
Admission: EM | Admit: 2020-10-10 | Discharge: 2020-10-10 | Disposition: A | Discharge status: Home / Self Care | Payer: Medicaid Other | Attending: Family Medicine | Admitting: Family Medicine

## 2020-10-10 ENCOUNTER — Other Ambulatory Visit: Payer: Self-pay

## 2020-10-10 DIAGNOSIS — Z20822 Contact with and (suspected) exposure to covid-19: Secondary | ICD-10-CM | POA: Diagnosis not present

## 2020-10-10 NOTE — ED Triage Notes (Signed)
Patient states that she was exposed to covid and would like to be tested.  

## 2020-10-10 NOTE — Discharge Instructions (Signed)

## 2020-10-11 LAB — SARS CORONAVIRUS 2 (TAT 6-24 HRS): SARS Coronavirus 2: NEGATIVE

## 2020-10-19 ENCOUNTER — Ambulatory Visit
Admission: EM | Admit: 2020-10-19 | Discharge: 2020-10-19 | Disposition: A | Discharge status: Home / Self Care | Payer: Medicaid Other | Attending: Family Medicine | Admitting: Family Medicine

## 2020-10-19 ENCOUNTER — Other Ambulatory Visit: Payer: Self-pay

## 2020-10-19 ENCOUNTER — Encounter: Payer: Self-pay | Admitting: Emergency Medicine

## 2020-10-19 DIAGNOSIS — R059 Cough, unspecified: Secondary | ICD-10-CM | POA: Insufficient documentation

## 2020-10-19 DIAGNOSIS — Z20822 Contact with and (suspected) exposure to covid-19: Secondary | ICD-10-CM | POA: Insufficient documentation

## 2020-10-19 MED ORDER — BENZONATATE 200 MG PO CAPS: 200.0000 mg | ORAL_CAPSULE | Freq: Three times a day (TID) | ORAL | 0 refills | Status: DC | PRN

## 2020-10-19 MED ORDER — PROMETHAZINE-DM 6.25-15 MG/5ML PO SYRP: 5.0000 mL | ORAL_SOLUTION | Freq: Four times a day (QID) | ORAL | 0 refills | Status: DC | PRN

## 2020-10-19 NOTE — Discharge Instructions (Signed)
Medication as prescribed.  Stay home.  Check my chart for COVID test results.  Take care  Dr. Genesis Paget   

## 2020-10-19 NOTE — ED Triage Notes (Signed)
Patient c/o cough x 2 days. Denies any other symptoms.

## 2020-10-20 LAB — SARS CORONAVIRUS 2 (TAT 6-24 HRS): SARS Coronavirus 2: NEGATIVE

## 2020-10-20 NOTE — ED Provider Notes (Signed)
[facility]    CSN: [id number] Arrival date & time: 10/19/20  1853      History   Chief Complaint Chief Complaint  Patient presents with  . Cough   HPI  25 year old female presents with cough.  2 day history of cough. Cough is severe. No relieving factors. No fever. No other reported symptoms. No other complaints.    Patient Active Problem List   Diagnosis Date Noted  . Kidney stones     Past Surgical History:  Procedure Laterality Date  . CYST REMOVAL TRUNK  2019  . GALLBLADDER SURGERY  2016  . OVARIAN CYST SURGERY  2011  . TONSILLECTOMY      OB History   No obstetric history on file.      Home Medications    Prior to Admission medications   Medication Sig Start Date End Date Taking? Authorizing Provider  amitriptyline (ELAVIL) 50 MG tablet Take by mouth.   Yes [provider]  benzonatate (TESSALON) 200 MG capsule Take 1 capsule (200 mg total) by mouth 3 (three) times daily as needed for cough. 10/19/20  Yes Murial Beam G, DO  busPIRone (BUSPAR) 30 MG tablet TAKE 1 TABLET(30 MG) BY MOUTH TWICE DAILY 10/18/19  Yes [provider]  etonogestrel-ethinyl estradiol (NUVARING) 0.12-0.015 MG/24HR vaginal ring Leave in place for 3 consecutive weeks, then remove for 1 week. 03/16/18  Yes [provider]  promethazine-dextromethorphan (PROMETHAZINE-DM) 6.25-15 MG/5ML syrup Take 5 mLs by mouth 4 (four) times daily as needed for cough. 10/19/20  Yes Mima Cranmore G, DO  propranolol (INDERAL) 20 MG tablet TAKE 1 TABLET(20 MG) BY MOUTH TWICE DAILY 05/19/20  Yes [provider]  dicyclomine (BENTYL) 10 MG capsule  04/19/18 10/19/20  [provider]    Family History Family History  Problem Relation Age of Onset  . Heart murmur Mother   . Mitral valve prolapse Mother   . Heart attack Father   . Atrial fibrillation Maternal Grandfather   . Stroke Paternal Grandmother   . Mitral valve prolapse Paternal Grandmother      Social History Social History   Tobacco Use  . Smoking status: Never Smoker  . Smokeless tobacco: Never Used  Substance Use Topics  . Alcohol use: No  . Drug use: No     Allergies   Patient has no known allergies.   Review of Systems Review of Systems  Constitutional: Negative for fever.  Respiratory: Positive for cough.    Physical Exam Triage Vital Signs ED Triage Vitals  Enc Vitals Group     BP 10/19/20 1951 (!) 116/58     Pulse Rate 10/19/20 1951 90     Resp 10/19/20 1951 18     Temp 10/19/20 1951 98.4 F (36.9 C)     Temp Source 10/19/20 1951 Oral     SpO2 10/19/20 1951 98 %     Weight 10/19/20 1949 195 lb 1.7 oz (88.5 kg)     Height 10/19/20 1949 5' (1.524 m)     Head Circumference --      Peak Flow --      Pain Score 10/19/20 1949 0     Pain Loc --      Pain Edu? --      Excl. in GC? --    No data found.  Updated Vital Signs BP (!) 116/58 (BP Location: Right Arm)   Pulse 90   Temp 98.4 F (36.9 C) (Oral)   Resp 18  Ht 5' (1.524 m)   Wt 88.5 kg   SpO2 98%   BMI 38.10 kg/m   Visual Acuity Right Eye Distance:   Left Eye Distance:   Bilateral Distance:    Right Eye Near:   Left Eye Near:    Bilateral Near:     Physical Exam Constitutional:      General: She is not in acute distress.    Appearance: Normal appearance. She is not ill-appearing.  HENT:     Head: Normocephalic and atraumatic.  Eyes:     General:        Right eye: No discharge.        Left eye: No discharge.     Conjunctiva/sclera: Conjunctivae normal.  Cardiovascular:     Rate and Rhythm: Normal rate and regular rhythm.     Heart sounds: No murmur heard.   Pulmonary:     Effort: Pulmonary effort is normal.     Breath sounds: Normal breath sounds. No wheezing or rales.  Neurological:     Mental Status: She is alert.    UC Treatments / Results  Labs (all labs ordered are listed, but only abnormal results are displayed) Labs Reviewed  SARS CORONAVIRUS 2 (TAT  6-24 HRS)    EKG   Radiology No results found.  Procedures Procedures (including critical care time)  Medications Ordered in UC Medications - No data to display  Initial Impression / Assessment and Plan / UC Course  I have reviewed the triage vital signs and the nursing notes.  Pertinent labs & imaging results that were available during my care of the patient were reviewed by me and considered in my medical decision making (see chart for details).    25 year old female presents with cough. COVID testing negative. Treating with tessalon perles and promethazine DM.  Final Clinical Impressions(s) / UC Diagnoses   Final diagnoses:  Cough     Discharge Instructions     Medication as prescribed.  Stay home.  Check my chart for COVID test results.  Take care  Dr. Adriana Simas     ED Prescriptions    Medication Sig Dispense Auth. Provider   benzonatate (TESSALON) 200 MG capsule Take 1 capsule (200 mg total) by mouth 3 (three) times daily as needed for cough. 30 capsule Miria Cappelli G, DO   promethazine-dextromethorphan (PROMETHAZINE-DM) 6.25-15 MG/5ML syrup Take 5 mLs by mouth 4 (four) times daily as needed for cough. 118 mL Tommie Sams, DO     PDMP not reviewed this encounter.   Tommie Sams, [place] 10/20/20 2304

## 2020-11-04 DIAGNOSIS — R112 Nausea with vomiting, unspecified: Secondary | ICD-10-CM | POA: Diagnosis not present

## 2020-11-04 DIAGNOSIS — N1339 Other hydronephrosis: Secondary | ICD-10-CM | POA: Diagnosis not present

## 2020-11-04 DIAGNOSIS — R42 Dizziness and giddiness: Secondary | ICD-10-CM | POA: Diagnosis not present

## 2020-11-04 DIAGNOSIS — R1012 Left upper quadrant pain: Secondary | ICD-10-CM | POA: Diagnosis not present

## 2020-11-04 DIAGNOSIS — R109 Unspecified abdominal pain: Secondary | ICD-10-CM | POA: Diagnosis not present

## 2020-11-04 DIAGNOSIS — T83192A Other mechanical complication of urinary stent, initial encounter: Secondary | ICD-10-CM | POA: Diagnosis not present

## 2020-11-04 DIAGNOSIS — N200 Calculus of kidney: Secondary | ICD-10-CM | POA: Diagnosis not present

## 2020-11-04 DIAGNOSIS — T83122A Displacement of urinary stent, initial encounter: Secondary | ICD-10-CM | POA: Diagnosis not present

## 2020-11-04 DIAGNOSIS — R300 Dysuria: Secondary | ICD-10-CM | POA: Diagnosis not present

## 2020-11-05 DIAGNOSIS — N1339 Other hydronephrosis: Secondary | ICD-10-CM | POA: Diagnosis not present

## 2020-11-05 DIAGNOSIS — R1012 Left upper quadrant pain: Secondary | ICD-10-CM | POA: Diagnosis not present

## 2020-11-05 DIAGNOSIS — R109 Unspecified abdominal pain: Secondary | ICD-10-CM | POA: Diagnosis not present

## 2020-11-05 DIAGNOSIS — T83192A Other mechanical complication of urinary stent, initial encounter: Secondary | ICD-10-CM | POA: Diagnosis not present

## 2020-11-05 DIAGNOSIS — T83122A Displacement of urinary stent, initial encounter: Secondary | ICD-10-CM | POA: Diagnosis not present

## 2020-11-15 DIAGNOSIS — R319 Hematuria, unspecified: Secondary | ICD-10-CM | POA: Diagnosis not present

## 2020-11-15 DIAGNOSIS — R3912 Poor urinary stream: Secondary | ICD-10-CM | POA: Diagnosis not present

## 2020-11-15 DIAGNOSIS — R1032 Left lower quadrant pain: Secondary | ICD-10-CM | POA: Diagnosis not present

## 2020-11-15 DIAGNOSIS — N200 Calculus of kidney: Secondary | ICD-10-CM | POA: Diagnosis not present

## 2020-11-19 DIAGNOSIS — R319 Hematuria, unspecified: Secondary | ICD-10-CM | POA: Diagnosis not present

## 2020-11-19 DIAGNOSIS — N200 Calculus of kidney: Secondary | ICD-10-CM | POA: Diagnosis not present

## 2020-11-19 DIAGNOSIS — Z87442 Personal history of urinary calculi: Secondary | ICD-10-CM | POA: Diagnosis not present

## 2020-11-19 DIAGNOSIS — R109 Unspecified abdominal pain: Secondary | ICD-10-CM | POA: Diagnosis not present

## 2020-11-19 DIAGNOSIS — N390 Urinary tract infection, site not specified: Secondary | ICD-10-CM | POA: Diagnosis not present

## 2020-11-19 DIAGNOSIS — R112 Nausea with vomiting, unspecified: Secondary | ICD-10-CM | POA: Diagnosis not present

## 2020-11-24 DIAGNOSIS — R112 Nausea with vomiting, unspecified: Secondary | ICD-10-CM | POA: Diagnosis not present

## 2020-11-24 DIAGNOSIS — Z20822 Contact with and (suspected) exposure to covid-19: Secondary | ICD-10-CM | POA: Diagnosis not present

## 2020-11-24 DIAGNOSIS — N200 Calculus of kidney: Secondary | ICD-10-CM | POA: Diagnosis not present

## 2020-11-24 DIAGNOSIS — R1032 Left lower quadrant pain: Secondary | ICD-10-CM | POA: Diagnosis not present

## 2020-11-24 DIAGNOSIS — R109 Unspecified abdominal pain: Secondary | ICD-10-CM | POA: Diagnosis not present

## 2020-11-24 DIAGNOSIS — R6883 Chills (without fever): Secondary | ICD-10-CM | POA: Diagnosis not present

## 2020-11-25 DIAGNOSIS — N200 Calculus of kidney: Secondary | ICD-10-CM | POA: Diagnosis not present

## 2020-11-25 DIAGNOSIS — R109 Unspecified abdominal pain: Secondary | ICD-10-CM | POA: Diagnosis not present

## 2020-12-01 DIAGNOSIS — B373 Candidiasis of vulva and vagina: Secondary | ICD-10-CM | POA: Diagnosis not present

## 2020-12-01 DIAGNOSIS — N3289 Other specified disorders of bladder: Secondary | ICD-10-CM | POA: Diagnosis not present

## 2020-12-01 DIAGNOSIS — N200 Calculus of kidney: Secondary | ICD-10-CM | POA: Diagnosis not present

## 2020-12-01 DIAGNOSIS — R109 Unspecified abdominal pain: Secondary | ICD-10-CM | POA: Diagnosis not present

## 2020-12-20 ENCOUNTER — Other Ambulatory Visit: Payer: Self-pay

## 2020-12-20 ENCOUNTER — Ambulatory Visit
Admission: EM | Admit: 2020-12-20 | Discharge: 2020-12-20 | Disposition: A | Discharge status: Home / Self Care | Payer: Medicaid Other | Attending: Emergency Medicine | Admitting: Emergency Medicine

## 2020-12-20 ENCOUNTER — Encounter: Payer: Self-pay | Admitting: Emergency Medicine

## 2020-12-20 DIAGNOSIS — R519 Headache, unspecified: Secondary | ICD-10-CM | POA: Diagnosis not present

## 2020-12-20 DIAGNOSIS — R0982 Postnasal drip: Secondary | ICD-10-CM | POA: Insufficient documentation

## 2020-12-20 DIAGNOSIS — Z20822 Contact with and (suspected) exposure to covid-19: Secondary | ICD-10-CM | POA: Diagnosis not present

## 2020-12-20 DIAGNOSIS — J309 Allergic rhinitis, unspecified: Secondary | ICD-10-CM

## 2020-12-20 DIAGNOSIS — R059 Cough, unspecified: Secondary | ICD-10-CM | POA: Diagnosis present

## 2020-12-20 DIAGNOSIS — J029 Acute pharyngitis, unspecified: Secondary | ICD-10-CM | POA: Insufficient documentation

## 2020-12-20 HISTORY — DX: Type 2 diabetes mellitus without complications: E11.9

## 2020-12-20 HISTORY — DX: Attention-deficit hyperactivity disorder, unspecified type: F90.9

## 2020-12-20 LAB — GROUP A STREP BY PCR: Group A Strep by PCR: NOT DETECTED

## 2020-12-20 MED ORDER — PREDNISONE 10 MG (21) PO TBPK: ORAL_TABLET | ORAL | 0 refills | Status: DC

## 2020-12-20 NOTE — ED Provider Notes (Signed)
[facility] [place] [facility] [place]   Name: Sandra Gibbs DOB: [birth year] MRN: [id number] CSN: [id number] PCP: Pcp, No  Arrival date and time:  12/20/20 8502  Chief Complaint:  Cough and Sore Throat   NOTE: Prior to seeing the patient today, I have reviewed the triage nursing documentation and vital signs. Clinical staff has updated patient's PMH/PSHx, current medication list, and drug allergies/intolerances to ensure comprehensive history available to assist in medical decision making.   History:   HPI: Sandra Gibbs is a 25 y.o. female who presents today with complaints of sore throat, cough, runny nose, postnasal drip, body aches, and headaches, x72 hours.  Patient has use over-the-counter sinus medication to treat her symptoms but she has not noticed little to no relief.  Patient denies fevers, although she has not been able to check her temperature at home.  No recent antibiotic use.   Past Medical History:  Diagnosis Date  . ADHD   . Diabetes mellitus without complication [facility])     Past Surgical History:  Procedure Laterality Date  . CYST REMOVAL TRUNK  2019  . GALLBLADDER SURGERY  2016  . KIDNEY SURGERY    . OVARIAN CYST SURGERY  2011  . TONSILLECTOMY      Family History  Problem Relation Age of Onset  . Heart murmur Mother   . Mitral valve prolapse Mother   . Heart attack Father   . Atrial fibrillation Maternal Grandfather   . Stroke Paternal Grandmother   . Mitral valve prolapse Paternal Grandmother     Social History   Tobacco Use  . Smoking status: Never Smoker  . Smokeless tobacco: Never Used  Vaping Use  . Vaping Use: Never used  Substance Use Topics  . Alcohol use: No  . Drug use: No    Patient Active Problem List   Diagnosis Date Noted  . Kidney stones     Home Medications:    Current Meds  Medication Sig  . ARIPiprazole (ABILIFY) 2 MG tablet TAKE 1 TABLET(2 MG) BY MOUTH EVERY DAY  . busPIRone (BUSPAR) 30 MG tablet TAKE 1  TABLET(30 MG) BY MOUTH TWICE DAILY  . CONCERTA 27 MG CR tablet Take 27 mg by mouth at bedtime.  Marland Kitchen FLUoxetine (PROZAC) 20 MG capsule Take 60 mg by mouth every morning.  . gabapentin (NEURONTIN) 300 MG capsule TAKE 1 CAPSULE(300 MG) BY MOUTH TWICE DAILY  . HYDROcodone-acetaminophen (NORCO/VICODIN) 5-325 MG tablet Take 1 tablet by mouth every 8 (eight) hours as needed.  Marland Kitchen levonorgestrel (MIRENA) 20 MCG/24HR IUD 1 each by Intrauterine route once.  . metFORMIN (GLUCOPHAGE) 500 MG tablet Take by mouth.  . oxybutynin (DITROPAN-XL) 10 MG 24 hr tablet Take 10 mg by mouth daily.  . predniSONE (STERAPRED UNI-PAK 21 TAB) 10 MG (21) TBPK tablet Take as instructed on package (60, 50, 40, 30, 20, 10)  . promethazine-dextromethorphan (PROMETHAZINE-DM) 6.25-15 MG/5ML syrup Take 5 mLs by mouth 4 (four) times daily as needed for cough.  . propranolol (INDERAL) 20 MG tablet TAKE 1 TABLET(20 MG) BY MOUTH TWICE DAILY    Allergies:   Patient has no known allergies.  Review of Systems (ROS): Review of Systems  Constitutional: Negative for activity change, appetite change, diaphoresis, fatigue and fever.  HENT: Positive for congestion, postnasal drip, rhinorrhea, sinus pressure, sinus pain and sore throat. Negative for ear discharge, ear pain, facial swelling, hearing loss and sneezing.   Eyes: Negative for pain.  Respiratory: Positive for cough. Negative for  shortness of breath and wheezing.   Gastrointestinal: Positive for nausea. Negative for constipation and vomiting.  Musculoskeletal: Positive for myalgias.  All other systems reviewed and are negative.    Vital Signs: Today's Vitals   12/20/20 0937 12/20/20 0938 12/20/20 0944 12/20/20 1008  BP:   (!) 115/91   Pulse:   84   Resp:   16   Temp:   98.1 F (36.7 C)   TempSrc:   Oral   SpO2:   97%   Weight:  230 lb (104.3 kg)    Height:  5' (1.524 m)    PainSc: 4    4     Physical Exam: Physical Exam Vitals and nursing note reviewed.   Constitutional:      General: She is not in acute distress.    Appearance: She is well-developed.  HENT:     Head: Normocephalic and atraumatic.     Right Ear: No middle ear effusion.     Left Ear:  No middle ear effusion.     Nose: Congestion present.     Right Turbinates: Enlarged.     Left Turbinates: Enlarged.     Mouth/Throat:     Pharynx: Posterior oropharyngeal erythema present.     Tonsils: No tonsillar exudate or tonsillar abscesses.  Eyes:     Conjunctiva/sclera: Conjunctivae normal.  Cardiovascular:     Rate and Rhythm: Normal rate and regular rhythm.     Heart sounds: No murmur heard.   Pulmonary:     Effort: Pulmonary effort is normal. No respiratory distress.     Breath sounds: Normal breath sounds.  Abdominal:     Palpations: Abdomen is soft.     Tenderness: There is no abdominal tenderness.  Musculoskeletal:     Cervical back: Neck supple.  Lymphadenopathy:     Cervical: No cervical adenopathy.  Skin:    General: Skin is warm and dry.  Neurological:     Mental Status: She is alert.      Urgent Care Treatments / Results:   LABS: PLEASE NOTE: all labs that were ordered this encounter are listed, however only abnormal results are displayed. Labs Reviewed  GROUP A STREP BY PCR  SARS CORONAVIRUS 2 (TAT 6-24 HRS)    EKG: -None  RADIOLOGY: No results found.  PROCEDURES: Procedures  MEDICATIONS RECEIVED THIS VISIT: Medications - No data to display  PERTINENT CLINICAL COURSE NOTES/UPDATES:   Initial Impression / Assessment and Plan / Urgent Care Course:  Pertinent labs & imaging results that were available during my care of the patient were personally reviewed by me and considered in my medical decision making (see lab/imaging section of note for values and interpretations).  Sandra Gibbs is a 25 y.o. female who presents to [facility] today with complaints of sinusitis symptoms, diagnosed with allergic sinusitis, and treated as such  with the medications below. NP and patient reviewed discharge instructions below during visit.   Patient is well appearing overall in clinic today. She does not appear to be in any acute distress. Presenting symptoms (see HPI) and exam as documented above.   I have reviewed the follow up and strict return precautions for any new or worsening symptoms. Patient is aware of symptoms that would be deemed urgent/emergent, and would thus require further evaluation either here or in the emergency department. At the time of discharge, she verbalized understanding and consent with the discharge plan as it was reviewed with her. All questions were fielded by  provider and/or clinic staff prior to patient discharge.    Final Clinical Impressions / Urgent Care Diagnoses:   Final diagnoses:  Allergic sinusitis    New Prescriptions:  [place] Controlled Substance Registry consulted? Not Applicable  Meds ordered this encounter  Medications  . predniSONE (STERAPRED UNI-PAK 21 TAB) 10 MG (21) TBPK tablet    Sig: Take as instructed on package (60, 50, 40, 30, 20, 10)    Dispense:  1 each    Refill:  0      Discharge Instructions     You were seen for sinus congestion and are being treated for allergic sinusitis.   Use antihistamines, such as cetirizine (Zyrtec) or loratadine (Claritin), to help with the drainage.  Over-the-counter Flonase 2 times a day will also help with sinus pressure.  Treat the pain with over-the-counter Tylenol and ibuprofen as needed.  Monitor your symptoms to make sure that they get better.  If your symptoms do not get better in a week since they started, or they get worse after a week, you may need to get reevaluated.   Take care, Dr. Sharlet Salina, NP-c     Recommended Follow up Care:  Patient encouraged to follow up with the following provider within the specified time frame, or sooner as dictated by the severity of her symptoms. As always, she was instructed that for any  urgent/emergent care needs, she should seek care either here or in the emergency department for more immediate evaluation.   Bailey Mech, DNP, NP-c    Bailey Mech, NP 12/20/20 1212

## 2020-12-20 NOTE — ED Triage Notes (Signed)
Patient c/o sore throat, cough, runny nose, post nasal drip, vomiting, bodyaches, and headaches that started Thursday.  Patient denies fevers.

## 2020-12-20 NOTE — Discharge Instructions (Signed)
You were seen for sinus congestion and are being treated for allergic sinusitis.   Use antihistamines, such as cetirizine (Zyrtec) or loratadine (Claritin), to help with the drainage.  Over-the-counter Flonase 2 times a day will also help with sinus pressure.  Treat the pain with over-the-counter Tylenol and ibuprofen as needed.  Monitor your symptoms to make sure that they get better.  If your symptoms do not get better in a week since they started, or they get worse after a week, you may need to get reevaluated.   Take care, Dr. Sharlet Salina, NP-c

## 2020-12-21 LAB — SARS CORONAVIRUS 2 (TAT 6-24 HRS): SARS Coronavirus 2: NEGATIVE

## 2020-12-29 DIAGNOSIS — R1032 Left lower quadrant pain: Secondary | ICD-10-CM | POA: Diagnosis not present

## 2020-12-29 DIAGNOSIS — N10 Acute tubulo-interstitial nephritis: Secondary | ICD-10-CM | POA: Diagnosis not present

## 2020-12-29 DIAGNOSIS — I880 Nonspecific mesenteric lymphadenitis: Secondary | ICD-10-CM | POA: Diagnosis not present

## 2020-12-29 DIAGNOSIS — Z20822 Contact with and (suspected) exposure to covid-19: Secondary | ICD-10-CM | POA: Diagnosis not present

## 2020-12-30 DIAGNOSIS — N200 Calculus of kidney: Secondary | ICD-10-CM | POA: Diagnosis not present

## 2020-12-31 ENCOUNTER — Inpatient Hospital Stay
Admit: 2020-12-31 | Discharge: 2020-12-31 | Disposition: A | Discharge status: Home / Self Care | Payer: Medicaid Other | Attending: Student | Admitting: Student

## 2020-12-31 ENCOUNTER — Inpatient Hospital Stay
Admission: EM | Admit: 2020-12-31 | Discharge: 2021-01-02 | DRG: 280 | Disposition: A | Discharge status: Home / Self Care | Payer: Medicaid Other | Attending: Internal Medicine | Admitting: Internal Medicine

## 2020-12-31 ENCOUNTER — Emergency Department: Payer: Medicaid Other

## 2020-12-31 ENCOUNTER — Other Ambulatory Visit: Payer: Self-pay

## 2020-12-31 DIAGNOSIS — Z79818 Long term (current) use of other agents affecting estrogen receptors and estrogen levels: Secondary | ICD-10-CM | POA: Diagnosis not present

## 2020-12-31 DIAGNOSIS — I880 Nonspecific mesenteric lymphadenitis: Secondary | ICD-10-CM | POA: Diagnosis present

## 2020-12-31 DIAGNOSIS — R109 Unspecified abdominal pain: Secondary | ICD-10-CM | POA: Diagnosis present

## 2020-12-31 DIAGNOSIS — I5021 Acute systolic (congestive) heart failure: Secondary | ICD-10-CM | POA: Diagnosis not present

## 2020-12-31 DIAGNOSIS — I214 Non-ST elevation (NSTEMI) myocardial infarction: Principal | ICD-10-CM | POA: Diagnosis present

## 2020-12-31 DIAGNOSIS — R0789 Other chest pain: Secondary | ICD-10-CM | POA: Diagnosis not present

## 2020-12-31 DIAGNOSIS — R079 Chest pain, unspecified: Secondary | ICD-10-CM | POA: Diagnosis not present

## 2020-12-31 DIAGNOSIS — Z8249 Family history of ischemic heart disease and other diseases of the circulatory system: Secondary | ICD-10-CM | POA: Diagnosis not present

## 2020-12-31 DIAGNOSIS — Z7984 Long term (current) use of oral hypoglycemic drugs: Secondary | ICD-10-CM

## 2020-12-31 DIAGNOSIS — Z79899 Other long term (current) drug therapy: Secondary | ICD-10-CM

## 2020-12-31 DIAGNOSIS — K580 Irritable bowel syndrome with diarrhea: Secondary | ICD-10-CM | POA: Diagnosis present

## 2020-12-31 DIAGNOSIS — I5181 Takotsubo syndrome: Secondary | ICD-10-CM | POA: Diagnosis not present

## 2020-12-31 DIAGNOSIS — Z20822 Contact with and (suspected) exposure to covid-19: Secondary | ICD-10-CM | POA: Diagnosis not present

## 2020-12-31 DIAGNOSIS — R7989 Other specified abnormal findings of blood chemistry: Secondary | ICD-10-CM | POA: Diagnosis not present

## 2020-12-31 DIAGNOSIS — Z91048 Other nonmedicinal substance allergy status: Secondary | ICD-10-CM

## 2020-12-31 DIAGNOSIS — R778 Other specified abnormalities of plasma proteins: Secondary | ICD-10-CM | POA: Diagnosis not present

## 2020-12-31 DIAGNOSIS — F909 Attention-deficit hyperactivity disorder, unspecified type: Secondary | ICD-10-CM | POA: Diagnosis not present

## 2020-12-31 DIAGNOSIS — N10 Acute tubulo-interstitial nephritis: Secondary | ICD-10-CM | POA: Diagnosis present

## 2020-12-31 DIAGNOSIS — F418 Other specified anxiety disorders: Secondary | ICD-10-CM | POA: Diagnosis not present

## 2020-12-31 DIAGNOSIS — F900 Attention-deficit hyperactivity disorder, predominantly inattentive type: Secondary | ICD-10-CM | POA: Diagnosis present

## 2020-12-31 DIAGNOSIS — E119 Type 2 diabetes mellitus without complications: Secondary | ICD-10-CM

## 2020-12-31 DIAGNOSIS — E669 Obesity, unspecified: Secondary | ICD-10-CM | POA: Diagnosis not present

## 2020-12-31 DIAGNOSIS — N200 Calculus of kidney: Secondary | ICD-10-CM | POA: Diagnosis not present

## 2020-12-31 DIAGNOSIS — F411 Generalized anxiety disorder: Secondary | ICD-10-CM | POA: Diagnosis present

## 2020-12-31 DIAGNOSIS — N12 Tubulo-interstitial nephritis, not specified as acute or chronic: Secondary | ICD-10-CM | POA: Diagnosis present

## 2020-12-31 LAB — TROPONIN I (HIGH SENSITIVITY)
Troponin I (High Sensitivity): 511 ng/L (ref <18)
Troponin I (High Sensitivity): 601 ng/L (ref <18)
Troponin I (High Sensitivity): 472 ng/L (ref <18)
Troponin I (High Sensitivity): 557 ng/L (ref <18)
Troponin I (High Sensitivity): 512 ng/L (ref <18)

## 2020-12-31 LAB — CBC WITH DIFFERENTIAL/PLATELET
Abs Immature Granulocytes: 0.04 10*3/uL (ref 0.00–0.07)
Basophils Absolute: 0 10*3/uL (ref 0.0–0.1)
Basophils Relative: 1 %
Eosinophils Absolute: 0.1 10*3/uL (ref 0.0–0.5)
Eosinophils Relative: 1 %
HCT: 38 % (ref 36.0–46.0)
Hemoglobin: 13 g/dL (ref 12.0–15.0)
Immature Granulocytes: 1 %
Lymphocytes Relative: 34 %
Lymphs Abs: 2.8 10*3/uL (ref 0.7–4.0)
MCH: 29.3 pg (ref 26.0–34.0)
MCHC: 34.2 g/dL (ref 30.0–36.0)
MCV: 85.6 fL (ref 80.0–100.0)
Monocytes Absolute: 1.2 10*3/uL — ABNORMAL HIGH (ref 0.1–1.0)
Monocytes Relative: 14 %
Neutro Abs: 4.2 10*3/uL (ref 1.7–7.7)
Neutrophils Relative %: 49 %
Platelets: 284 10*3/uL (ref 150–400)
RBC: 4.44 MIL/uL (ref 3.87–5.11)
RDW: 12.8 % (ref 11.5–15.5)
WBC: 8.4 10*3/uL (ref 4.0–10.5)
nRBC: 0 % (ref 0.0–0.2)

## 2020-12-31 LAB — HEPARIN LEVEL (UNFRACTIONATED)
Heparin Unfractionated: <0.1 IU/mL — ABNORMAL LOW (ref 0.30–0.70)
Heparin Unfractionated: 0.17 IU/mL — ABNORMAL LOW (ref 0.30–0.70)

## 2020-12-31 LAB — COMPREHENSIVE METABOLIC PANEL
ALT: 25 U/L (ref 0–44)
AST: 20 U/L (ref 15–41)
Albumin: 3.3 g/dL — ABNORMAL LOW (ref 3.5–5.0)
Alkaline Phosphatase: 71 U/L (ref 38–126)
Anion gap: 8 (ref 5–15)
BUN: 8 mg/dL (ref 6–20)
CO2: 23 mmol/L (ref 22–32)
Calcium: 8.4 mg/dL — ABNORMAL LOW (ref 8.9–10.3)
Chloride: 106 mmol/L (ref 98–111)
Creatinine, Ser: 0.77 mg/dL (ref 0.44–1.00)
GFR, Estimated: >60 mL/min (ref >60)
Glucose, Bld: 119 mg/dL — ABNORMAL HIGH (ref 70–99)
Potassium: 3.9 mmol/L (ref 3.5–5.1)
Sodium: 137 mmol/L (ref 135–145)
Total Bilirubin: 0.7 mg/dL (ref 0.3–1.2)
Total Protein: 6.6 g/dL (ref 6.5–8.1)

## 2020-12-31 LAB — HEMOGLOBIN A1C
Hgb A1c MFr Bld: 5.4 % (ref 4.8–5.6)
Mean Plasma Glucose: 108.28 mg/dL

## 2020-12-31 LAB — SARS CORONAVIRUS 2 (TAT 6-24 HRS): SARS Coronavirus 2: NEGATIVE

## 2020-12-31 LAB — CBG MONITORING, ED: Glucose-Capillary: 99 mg/dL (ref 70–99)

## 2020-12-31 LAB — PROTIME-INR
INR: 1 (ref 0.8–1.2)
Prothrombin Time: 12.9 seconds (ref 11.4–15.2)

## 2020-12-31 LAB — ECHOCARDIOGRAM COMPLETE
AR max vel: 1.84 cm2
AV Area VTI: 2.18 cm2
AV Area mean vel: 2.14 cm2
AV Mean grad: 3 mmHg
AV Peak grad: 5.4 mmHg
Ao pk vel: 1.16 m/s
Area-P 1/2: 3.56 cm2
S' Lateral: 3.06 cm

## 2020-12-31 LAB — LIPASE, BLOOD: Lipase: 33 U/L (ref 11–51)

## 2020-12-31 LAB — GLUCOSE, CAPILLARY
Glucose-Capillary: 128 mg/dL — ABNORMAL HIGH (ref 70–99)
Glucose-Capillary: 126 mg/dL — ABNORMAL HIGH (ref 70–99)

## 2020-12-31 LAB — APTT: aPTT: 29 seconds (ref 24–36)

## 2020-12-31 LAB — BRAIN NATRIURETIC PEPTIDE: B Natriuretic Peptide: 26.1 pg/mL (ref 0.0–100.0)

## 2020-12-31 IMAGING — CT CT ANGIO CHEST
2 of 6 series · 18 of 46 positions shown · IV contrast (APPLIED)
Comparison: Chest x-ray from earlier in the same day.

CLINICAL DATA: Chest pain

EXAM:
CT ANGIOGRAPHY CHEST WITH CONTRAST
TECHNIQUE: Multidetector CT imaging of the chest was performed using the
standard protocol during bolus administration of intravenous
contrast. Multiplanar CT image reconstructions and MIPs were
obtained to evaluate the vascular anatomy.
CONTRAST:  75mL OMNIPAQUE IOHEXOL 350 MG/ML SOLN

[Series 6: thins · axial · 0.64mm/px · z∈[-354,-123]mm · 15 of 254 slices shown]
[im 12/254  lung]
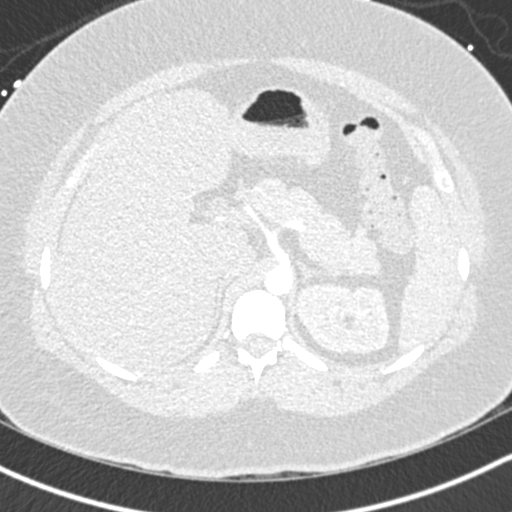
[im 34/254  soft-tissue]
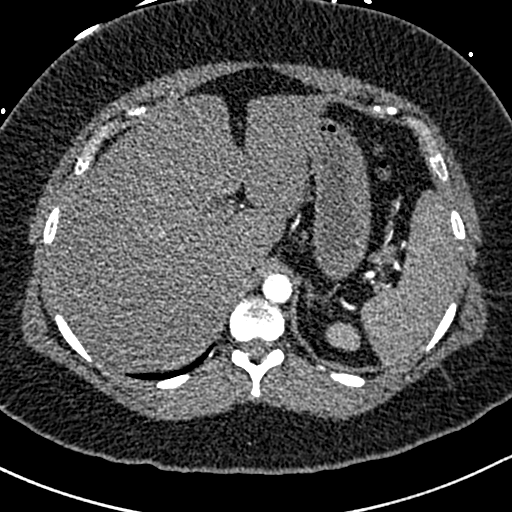
[im 45/254  lung]
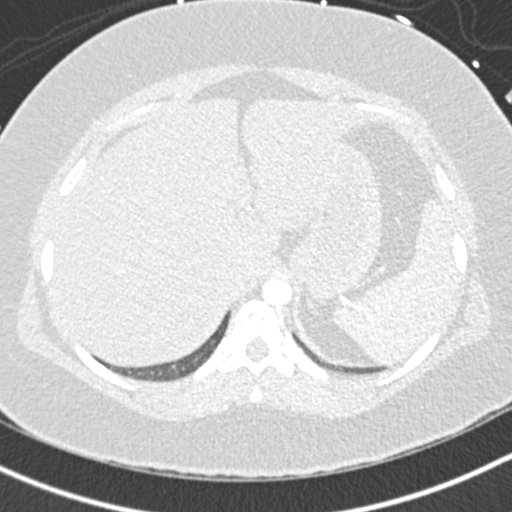
[im 67/254  soft-tissue]
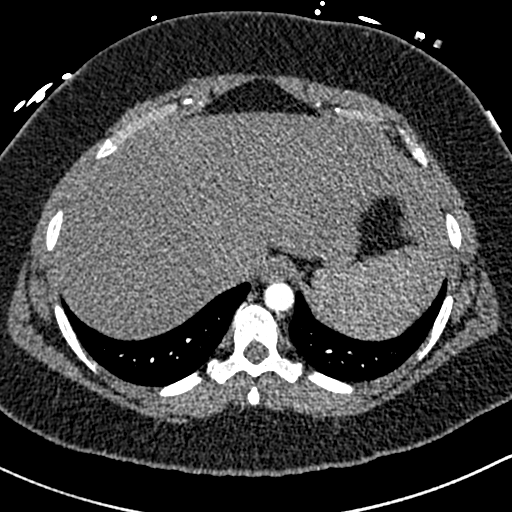
[im 78/254  lung]
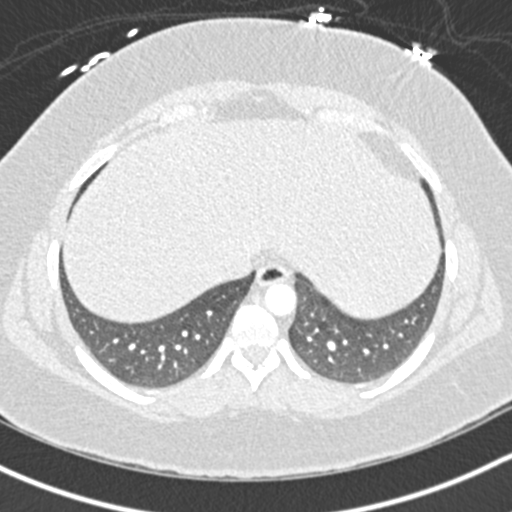
[im 100/254  soft-tissue]
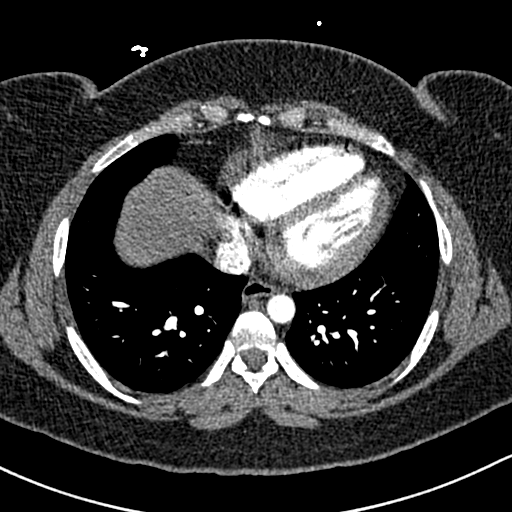
[im 111/254  lung]
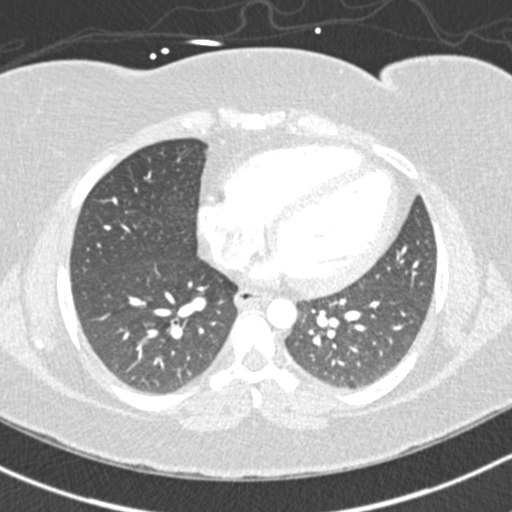
[im 133/254  soft-tissue]
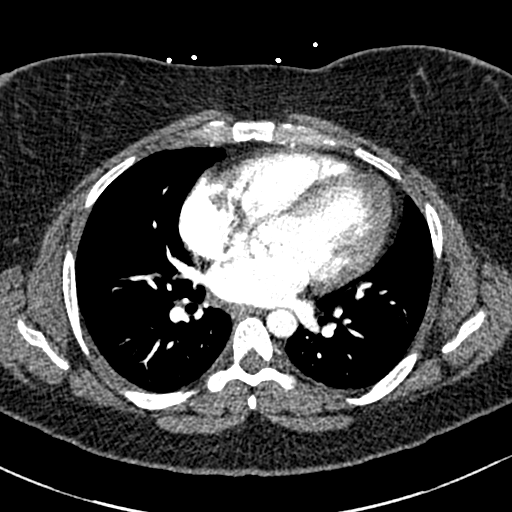
[im 144/254  lung]
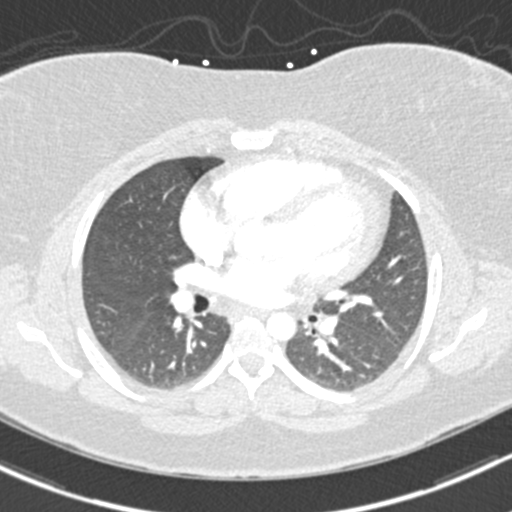
[im 155/254  soft-tissue]
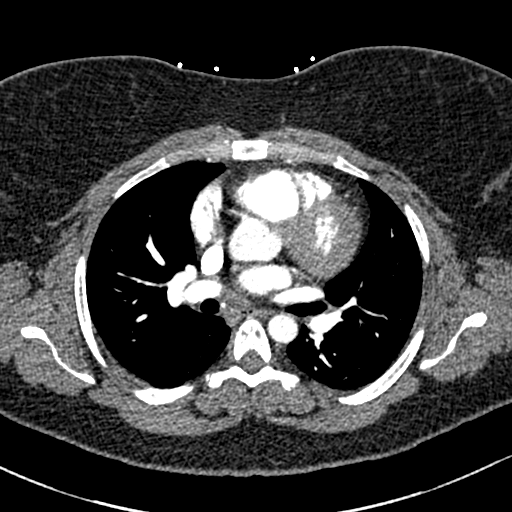
[im 177/254  lung]
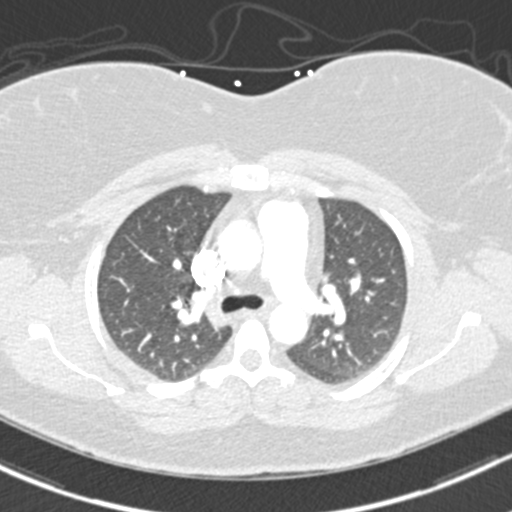
[im 188/254  soft-tissue]
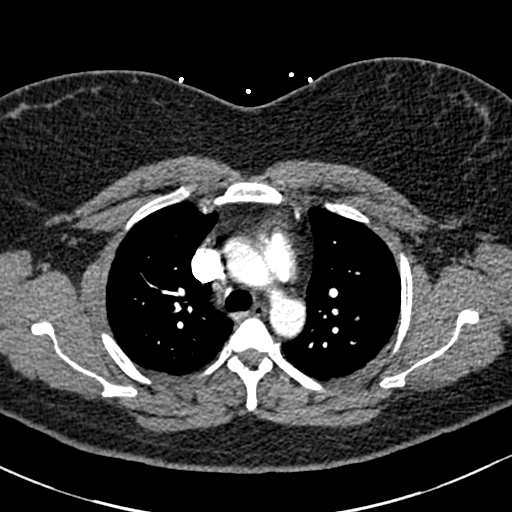
[im 210/254  lung]
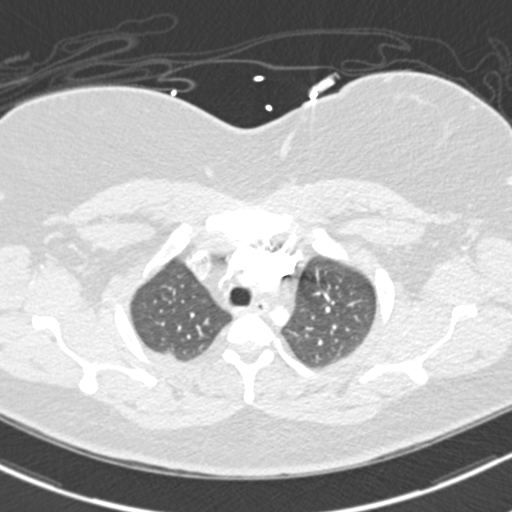
[im 221/254  soft-tissue]
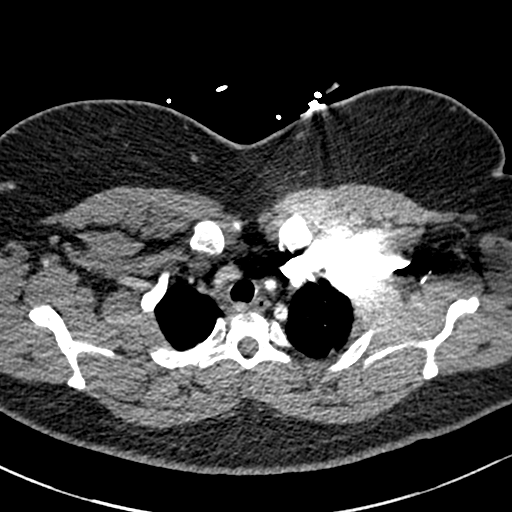
[im 243/254  lung]
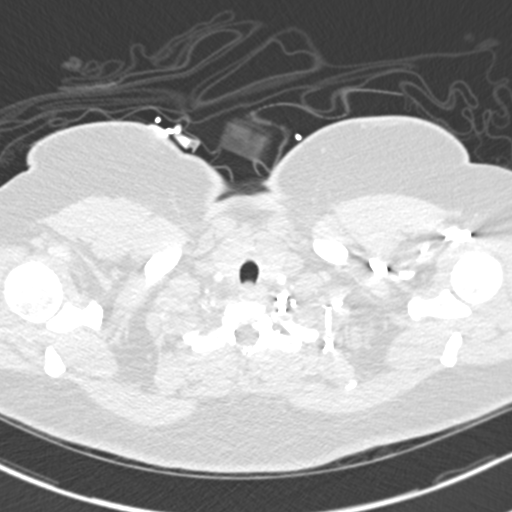

[Series 8: coronal mpr · coronal · 0.50mm/px · 3 of 87 slices shown]
[im 22/87  soft-tissue]
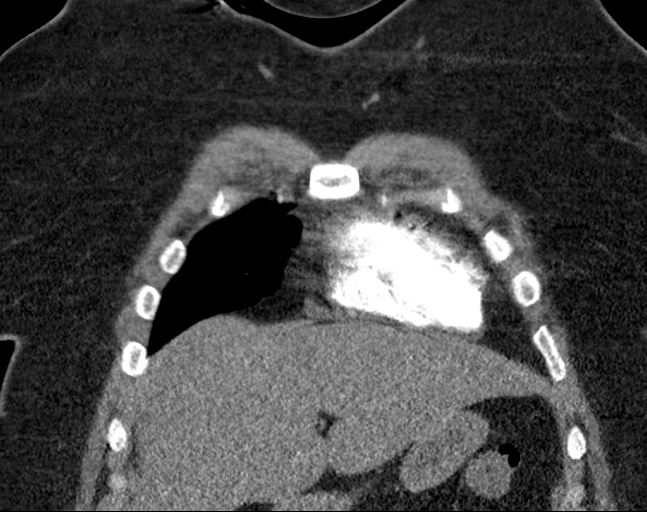
[im 44/87  soft-tissue]
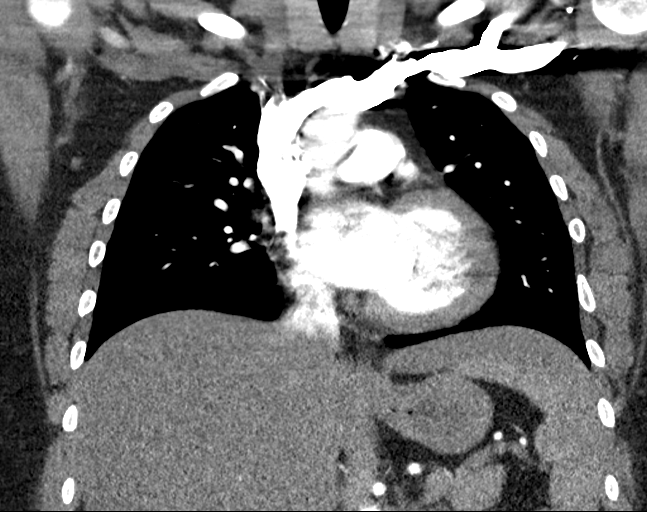
[im 65/87  soft-tissue]
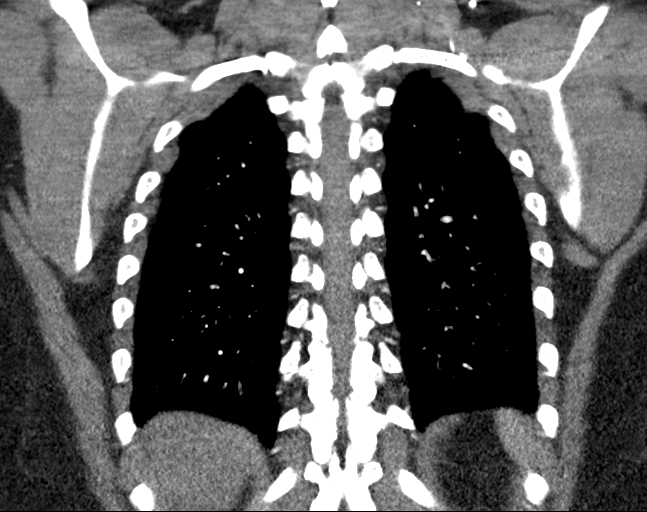

[18 of 46 positions shown; findings below may reference images not displayed]

FINDINGS: Cardiovascular: Thoracic aorta shows no aneurysmal dilatation or
dissection. Heart is mildly enlarged in size. No pericardial
effusion is seen. No significant coronary calcifications are noted.
Pulmonary artery shows a normal branching pattern bilaterally. No
intraluminal filling defect is identified to suggest pulmonary
embolism.

Mediastinum/Nodes: Thoracic inlet is within normal limits. No
sizable hilar or mediastinal adenopathy is noted esophagus as
visualized is within normal limits.

Lungs/Pleura: Lungs are well aerated bilaterally. No focal
infiltrate or sizable effusion is seen. No bony abnormality is
noted.

Upper Abdomen: Visualized upper abdomen is unremarkable.

Musculoskeletal: No chest wall abnormality. No acute or significant
osseous findings.

Review of the MIP images confirms the above findings.
IMPRESSION: No evidence of pulmonary emboli.

No acute abnormality noted.

## 2020-12-31 IMAGING — CR DG CHEST 1V PORT
1 series · 1 of 1 positions shown · non-contrast
Comparison: [DATE] abdominal CT

CLINICAL DATA: Chest pain

EXAM:
PORTABLE CHEST 1 VIEW

[chest ap]
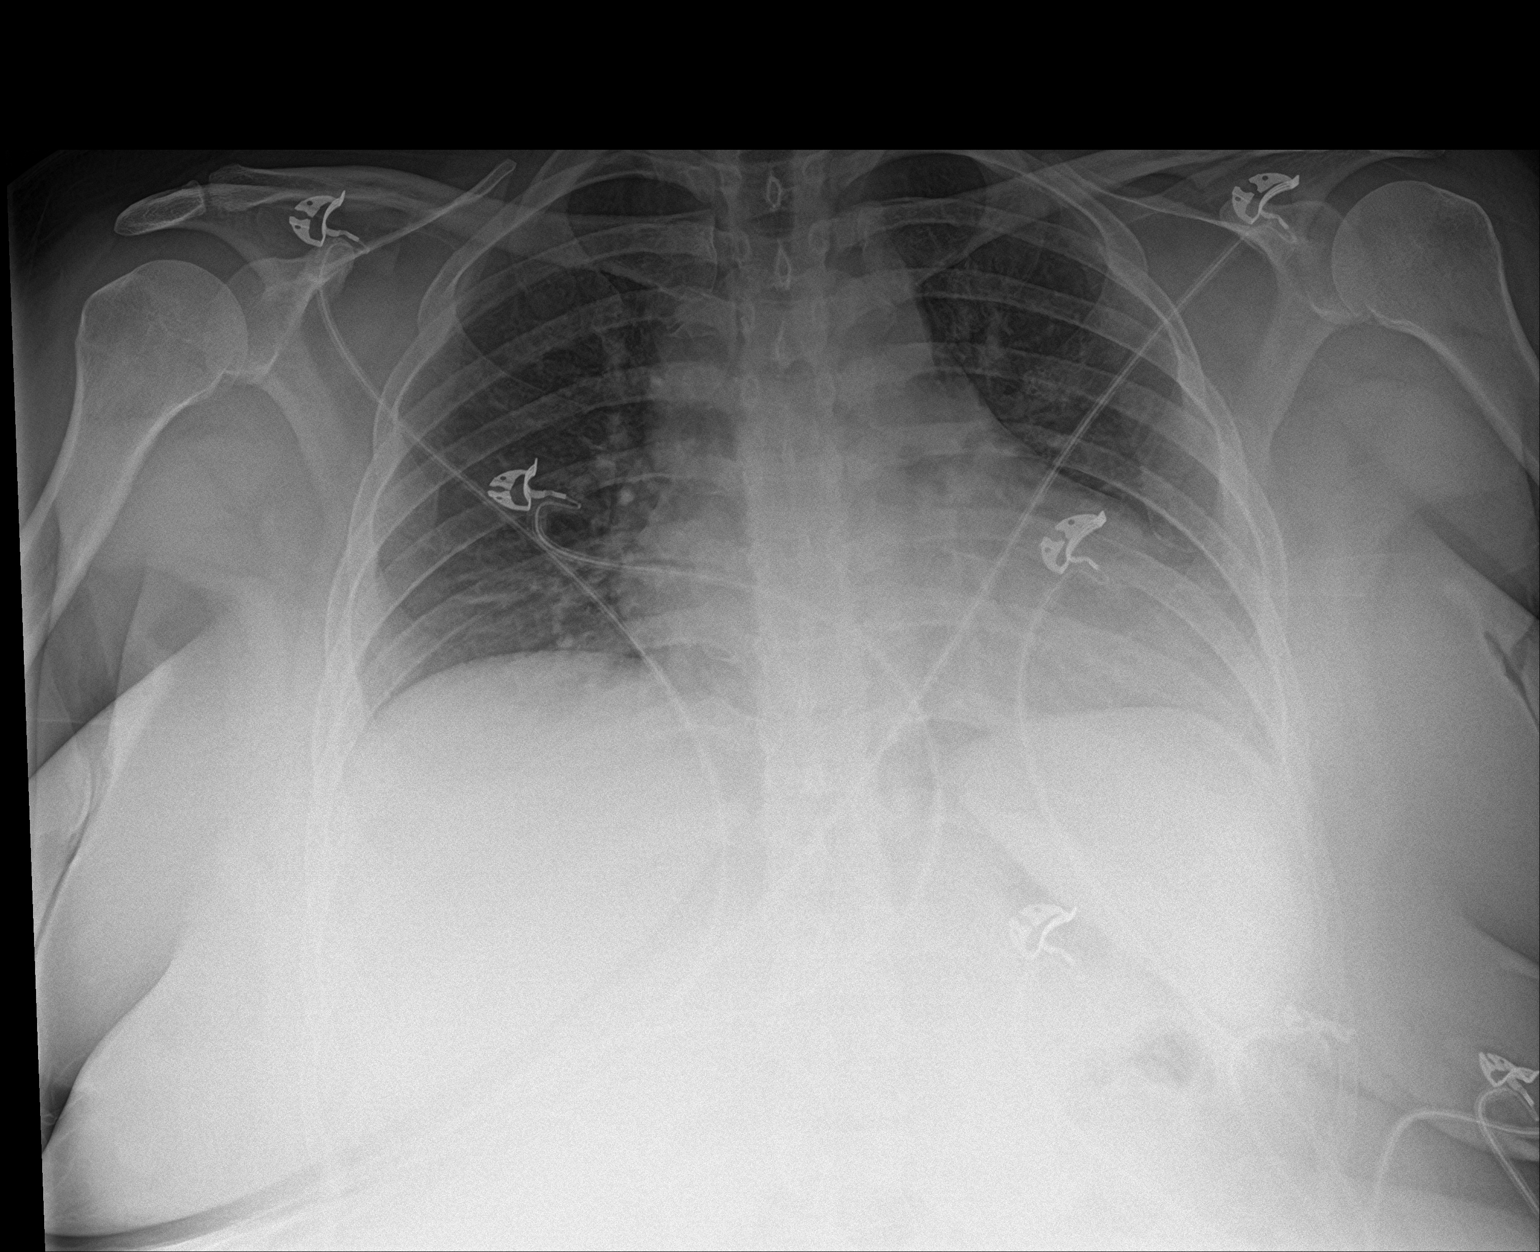

[1 of 1 positions shown; findings below may reference images not displayed]

FINDINGS: Prominent heart size accentuated by low volumes. There is no edema,
consolidation, effusion, or pneumothorax. Artifact from EKG leads
IMPRESSION: 1. Prominent heart size that is accentuated by low volumes.
2. Clear lungs.

## 2020-12-31 MED ORDER — SODIUM CHLORIDE 0.9 % WEIGHT BASED INFUSION
3.0000 mL/kg/h | INTRAVENOUS | Status: AC
  Administered 2021-01-01: 3 mL/kg/h via INTRAVENOUS

## 2020-12-31 MED ORDER — LORAZEPAM 2 MG/ML IJ SOLN
1.0000 mg | Freq: Once | INTRAMUSCULAR | Status: AC
  Administered 2020-12-31: 1 mg via INTRAVENOUS
  Filled 2020-12-31: qty 1

## 2020-12-31 MED ORDER — HEPARIN (PORCINE) 25000 UT/250ML-% IV SOLN
1250.0000 [IU]/h | INTRAVENOUS | Status: DC
  Administered 2020-12-31: 950 [IU]/h via INTRAVENOUS
  Filled 2020-12-31 (×2): qty 250

## 2020-12-31 MED ORDER — INSULIN ASPART 100 UNIT/ML ~~LOC~~ SOLN: 0.0000 [IU] | Freq: Three times a day (TID) | SUBCUTANEOUS | Status: DC

## 2020-12-31 MED ORDER — ONDANSETRON HCL 4 MG/2ML IJ SOLN
4.0000 mg | Freq: Once | INTRAMUSCULAR | Status: AC
  Administered 2020-12-31: 4 mg via INTRAVENOUS

## 2020-12-31 MED ORDER — MORPHINE SULFATE (PF) 4 MG/ML IV SOLN
INTRAVENOUS | Status: AC
  Administered 2020-12-31: 4 mg via INTRAVENOUS
  Filled 2020-12-31: qty 1

## 2020-12-31 MED ORDER — BUSPIRONE HCL 15 MG PO TABS
30.0000 mg | ORAL_TABLET | Freq: Two times a day (BID) | ORAL | Status: DC
  Administered 2020-12-31 – 2021-01-02 (×4): 30 mg via ORAL
  Filled 2020-12-31 (×5): qty 2

## 2020-12-31 MED ORDER — SODIUM CHLORIDE 0.9 % WEIGHT BASED INFUSION
1.0000 mL/kg/h | INTRAVENOUS | Status: DC
  Administered 2021-01-01: 1 mL/kg/h via INTRAVENOUS

## 2020-12-31 MED ORDER — ACETAMINOPHEN 325 MG PO TABS
650.0000 mg | ORAL_TABLET | Freq: Four times a day (QID) | ORAL | Status: DC | PRN
  Administered 2021-01-02: 650 mg via ORAL
  Filled 2020-12-31: qty 2

## 2020-12-31 MED ORDER — ASPIRIN 81 MG PO CHEW
324.0000 mg | CHEWABLE_TABLET | Freq: Once | ORAL | Status: AC
  Administered 2020-12-31: 324 mg via ORAL
  Filled 2020-12-31: qty 4

## 2020-12-31 MED ORDER — ASPIRIN 81 MG PO CHEW
81.0000 mg | CHEWABLE_TABLET | ORAL | Status: AC
  Administered 2021-01-01: 81 mg via ORAL
  Filled 2020-12-31: qty 1

## 2020-12-31 MED ORDER — ONDANSETRON HCL 4 MG/2ML IJ SOLN
INTRAMUSCULAR | Status: AC
  Administered 2020-12-31: 4 mg via INTRAVENOUS
  Filled 2020-12-31: qty 2

## 2020-12-31 MED ORDER — OXYBUTYNIN CHLORIDE ER 10 MG PO TB24
10.0000 mg | ORAL_TABLET | Freq: Every day | ORAL | Status: DC
  Administered 2021-01-01 – 2021-01-02 (×2): 10 mg via ORAL
  Filled 2020-12-31 (×2): qty 1

## 2020-12-31 MED ORDER — SODIUM CHLORIDE 0.9% FLUSH
3.0000 mL | Freq: Two times a day (BID) | INTRAVENOUS | Status: DC
  Administered 2020-12-31 – 2021-01-01 (×3): 3 mL via INTRAVENOUS

## 2020-12-31 MED ORDER — LORAZEPAM 2 MG/ML IJ SOLN: 0.5000 mg | Freq: Three times a day (TID) | INTRAMUSCULAR | Status: DC | PRN

## 2020-12-31 MED ORDER — SODIUM CHLORIDE 0.9 % IV SOLN: INTRAVENOUS | Status: DC

## 2020-12-31 MED ORDER — ARIPIPRAZOLE 2 MG PO TABS
2.0000 mg | ORAL_TABLET | Freq: Every day | ORAL | Status: DC
  Administered 2020-12-31 – 2021-01-01 (×2): 2 mg via ORAL
  Filled 2020-12-31 (×3): qty 1

## 2020-12-31 MED ORDER — ASPIRIN EC 81 MG PO TBEC
81.0000 mg | DELAYED_RELEASE_TABLET | Freq: Every day | ORAL | Status: DC
  Administered 2021-01-02: 81 mg via ORAL
  Filled 2020-12-31 (×2): qty 1

## 2020-12-31 MED ORDER — SODIUM CHLORIDE 0.9 % IV SOLN
2.0000 g | INTRAVENOUS | Status: DC
  Administered 2020-12-31 – 2021-01-02 (×2): 2 g via INTRAVENOUS
  Filled 2020-12-31 (×3): qty 20

## 2020-12-31 MED ORDER — PROPRANOLOL HCL 20 MG PO TABS
20.0000 mg | ORAL_TABLET | Freq: Two times a day (BID) | ORAL | Status: DC
  Administered 2020-12-31 – 2021-01-02 (×3): 20 mg via ORAL
  Filled 2020-12-31 (×5): qty 1

## 2020-12-31 MED ORDER — SODIUM CHLORIDE 0.9% FLUSH: 3.0000 mL | INTRAVENOUS | Status: DC | PRN

## 2020-12-31 MED ORDER — MORPHINE SULFATE (PF) 2 MG/ML IV SOLN
2.0000 mg | INTRAVENOUS | Status: DC | PRN
Start: 2020-12-31 — End: 2020-12-31
  Administered 2020-12-31: 2 mg via INTRAVENOUS
  Filled 2020-12-31: qty 1

## 2020-12-31 MED ORDER — FLUOXETINE HCL 20 MG PO CAPS
60.0000 mg | ORAL_CAPSULE | Freq: Every morning | ORAL | Status: DC
  Administered 2021-01-01 – 2021-01-02 (×2): 60 mg via ORAL
  Filled 2020-12-31 (×3): qty 3

## 2020-12-31 MED ORDER — HEPARIN BOLUS VIA INFUSION
2100.0000 [IU] | Freq: Once | INTRAVENOUS | Status: AC
  Administered 2020-12-31: 2100 [IU] via INTRAVENOUS
  Filled 2020-12-31: qty 2100

## 2020-12-31 MED ORDER — HEPARIN BOLUS VIA INFUSION
4000.0000 [IU] | Freq: Once | INTRAVENOUS | Status: AC
  Administered 2020-12-31: 4000 [IU] via INTRAVENOUS
  Filled 2020-12-31: qty 4000

## 2020-12-31 MED ORDER — ONDANSETRON HCL 4 MG/2ML IJ SOLN: 4.0000 mg | Freq: Three times a day (TID) | INTRAMUSCULAR | Status: DC | PRN

## 2020-12-31 MED ORDER — OXYCODONE-ACETAMINOPHEN 5-325 MG PO TABS
1.0000 | ORAL_TABLET | ORAL | Status: DC | PRN
Start: 2020-12-31 — End: 2021-01-02
  Administered 2020-12-31 – 2021-01-01 (×3): 1 via ORAL
  Filled 2020-12-31 (×4): qty 1

## 2020-12-31 MED ORDER — HYOSCYAMINE SULFATE 0.125 MG SL SUBL
0.1250 mg | SUBLINGUAL_TABLET | Freq: Three times a day (TID) | SUBLINGUAL | Status: DC | PRN
  Administered 2021-01-01: 0.125 mg via ORAL
  Filled 2020-12-31 (×2): qty 1

## 2020-12-31 MED ORDER — INSULIN ASPART 100 UNIT/ML ~~LOC~~ SOLN: 0.0000 [IU] | Freq: Every day | SUBCUTANEOUS | Status: DC

## 2020-12-31 MED ORDER — IOHEXOL 350 MG/ML SOLN
75.0000 mL | Freq: Once | INTRAVENOUS | Status: AC | PRN
  Administered 2020-12-31: 75 mL via INTRAVENOUS

## 2020-12-31 MED ORDER — MORPHINE SULFATE (PF) 4 MG/ML IV SOLN: 4.0000 mg | Freq: Once | INTRAVENOUS | Status: AC

## 2020-12-31 MED ORDER — ATORVASTATIN CALCIUM 20 MG PO TABS
40.0000 mg | ORAL_TABLET | Freq: Every day | ORAL | Status: DC
  Administered 2020-12-31 – 2021-01-02 (×3): 40 mg via ORAL
  Filled 2020-12-31 (×2): qty 2

## 2020-12-31 MED ORDER — HYDROMORPHONE HCL 1 MG/ML IJ SOLN
1.0000 mg | INTRAMUSCULAR | Status: DC | PRN
Start: 2020-12-31 — End: 2021-01-02

## 2020-12-31 MED ORDER — SODIUM CHLORIDE 0.9 % IV SOLN: 250.0000 mL | INTRAVENOUS | Status: DC | PRN

## 2020-12-31 MED ORDER — NITROGLYCERIN 0.4 MG SL SUBL
0.4000 mg | SUBLINGUAL_TABLET | SUBLINGUAL | Status: DC | PRN
  Administered 2020-12-31 (×3): 0.4 mg via SUBLINGUAL
  Filled 2020-12-31 (×2): qty 1

## 2020-12-31 MED ORDER — METHYLPHENIDATE HCL ER (OSM) 27 MG PO TBCR
27.0000 mg | EXTENDED_RELEASE_TABLET | Freq: Every day | ORAL | Status: DC
  Filled 2020-12-31: qty 1

## 2020-12-31 NOTE — Consult Note (Signed)
ANTICOAGULATION CONSULT NOTE - Follow Up Consult  Pharmacy Consult for Heparin Drip Indication: chest pain/ACS/STEMI  Allergies  Allergen Reactions  . Wound Dressing Adhesive Itching, Other (See Comments) and Rash    Other: "tears skin off" Other: "tears skin off" Other: "tears skin off"   . Latex Itching    Patient Measurements:   Heparin Dosing Weight: 71.1kg  Vital Signs: Temp: 98.3 F (36.8 C) (03/24 1521) Temp Source: Oral (03/24 1521) BP: 102/64 (03/24 1635) Pulse Rate: 70 (03/24 1635)  Labs: Recent Labs    12/31/20 0636 12/31/20 0834 12/31/20 0855 12/31/20 1200 12/31/20 1531  HGB 13.0  --   --   --   --   HCT 38.0  --   --   --   --   PLT 284  --   --   --   --   APTT  --   --  29  --   --   LABPROT  --   --  12.9  --   --   INR  --   --  1.0  --   --   HEPARINUNFRC  --   --   --   --  <0.10*  CREATININE 0.77  --   --   --   --   TROPONINIHS 511* 601*  --  472* 557*    Estimated Creatinine Clearance: 116.1 mL/min (by C-G formula based on SCr of 0.77 mg/dL).   Medications:  No PTA anticoagulation of record  Assessment: 24 yo obese female with recent treatment at [facility] for UTI without cardiac history presents with elevated troponin and chest pain.    Pharmacy has been consulted to initiate and monitor a heparin drip.  3/24 15:31 HL <0.10   Goal of Therapy:  Heparin level 0.3-0.7 units/ml Monitor platelets by anticoagulation protocol: Yes   Plan:  HL is subtherapeutic, will order Heparin 2100 units IV bolus and increase drip rate to 1250 units/hr. Recheck HL 6 hours after rate change. Daily CBC while on Heparin drip.  Clovia Cuff, PharmD, BCPS 12/31/2020 4:59 PM

## 2020-12-31 NOTE — Consult Note (Signed)
ANTICOAGULATION CONSULT NOTE - Follow Up Consult  Pharmacy Consult for Heparin Drip Indication: chest pain/ACS/STEMI  No Known Allergies  Patient Measurements:   Heparin Dosing Weight: 71.1kg  Vital Signs: Temp: 97.7 F (36.5 C) (03/24 0631) Temp Source: Oral (03/24 0631) BP: 104/55 (03/24 0823) Pulse Rate: 64 (03/24 0823)  Labs: Recent Labs    12/31/20 0636  HGB 13.0  HCT 38.0  PLT 284  CREATININE 0.77  TROPONINIHS 511*    Estimated Creatinine Clearance: 116.1 mL/min (by C-G formula based on SCr of 0.77 mg/dL).   Medications:  No PTA anticoagulation of record  Assessment: 25 yo obese female with recent treatment at [facility] for UTI without cardiac history presents with elevated troponin and chest pain.    Pharmacy has been consulted to initiate and monitor a heparin drip.   Goal of Therapy:  Heparin level 0.3-0.7 units/ml Monitor platelets by anticoagulation protocol: Yes   Plan:  Give 4000 units bolus x 1 Start heparin infusion at 950 units/hr Check anti-Xa level in 6 hours and daily while on heparin Continue to monitor H&H and platelets  Albina Billet, PharmD, BCPS Clinical Pharmacist 12/31/2020 8:50 AM

## 2020-12-31 NOTE — H&P (Addendum)
History and Physical    Sandra Gibbs EAV:[id number] DOB: [birth year] DOA: 12/31/2020  Referring MD/NP/PA:   PCP: Cathie Hoopswens, Leanne Whaley, PA   Patient coming from:  The patient is coming from home.  At baseline, pt is independent for most of ADL.        Chief Complaint: chest pain  HPI: Sandra Gibbs is a 25 y.o. female with medical history significant of DM, ADHD, kidney stone, IBS, pyelonephritis, who presents with chest pain.  Patient states that her chest pain started noon early morning at about 3 AM, which is located in substernal area, 8 out of 10 severity, sharp, burning-like pain, radiating to the left shoulder, associated with shortness of breath.  The pain is pleuritic, aggravated with deep breath.  No cough, fever or chills.  Patient states that she has nausea and vomited few times, currently no vomiting, but still has nausea.  No abdominal pain.  Patient states that she has chronic intermittent diarrhea due to IBS, which is normal to her.   Of note, pt was recently hospitalized due to kidney stone. She had stent placement. She had urology follow-up in 2 weeks and stent has been removed. Patient states that because of left flank pain, [facility]. She was diagnosed with pyelonephritis.  Urinalysis showed yellow appearance, small amount of leukocyte, rare bacteria and WBC 12.  She was given 1 dose of Rocephin IV, and discharged on oral antibiotics, but the patient has not picked up the prescription yet.  She states that that time she had fever and chills.  She did not have dysuria, burning on urination or urinary frequency.  She states that has chronic hematuria which has not changed.   She also had CT-abd/pelvis which showed no obstructing renal calculi, bilateral medullary nephrocalcinosis and nonobstructing renal calculi, similar to prior. CT scan also showed mildly prominent right lower quadrant mesenteric lymph nodes, increased since prior and possibly  representing mesenteric adenitis and no evidence of acute appendicitis.   ED Course: pt was found to have troponin level 511, pending COVID-19 PCR, WBC 8.4, electrolytes renal function okay, temperature normal, blood pressure 104/55, heart rate 64, RR 23, 11, oxygen saturation 96% on room air.  CT angiograms negative for PE.  Chest x-ray is negative for infiltration.  Patient is admitted to progressive bed as inpatient.  Dr. Juliann Paresallwood of cardiology is consulted   Review of Systems:   General: no fevers, chills, no body weight gain, has fatigue HEENT: no blurry vision, hearing changes or sore throat Respiratory: has dyspnea, no coughing, wheezing CV: has chest pain, no palpitations GI: has nausea, vomiting, no abdominal pain, has diarrhea, no constipation GU: no dysuria, burning on urination, increased urinary frequency, has hematuria. Has right flank pain Ext: no leg edema Neuro: no unilateral weakness, numbness, or tingling, no vision change or hearing loss Skin: no rash, no skin tear. MSK: No muscle spasm, no deformity, no limitation of range of movement in spin Heme: No easy bruising.  Travel history: No recent long distant travel.  Allergy:  Allergies  Allergen Reactions  . Wound Dressing Adhesive Itching, Other (See Comments) and Rash    Other: "tears skin off" Other: "tears skin off" Other: "tears skin off"   . Latex Itching    Past Medical History:  Diagnosis Date  . ADHD   . Diabetes mellitus without complication [facility](HCC)     Past Surgical History:  Procedure Laterality Date  . CYST REMOVAL TRUNK  2019  . GALLBLADDER SURGERY  2016  . KIDNEY SURGERY    . OVARIAN CYST SURGERY  2011  . TONSILLECTOMY      Social History:  reports that she has never smoked. She has never used smokeless tobacco. She reports that she does not drink alcohol and does not use drugs.  Family History:  Family History  Problem Relation Age of Onset  . Heart murmur Mother   . Mitral valve  prolapse Mother   . Heart attack Father   . Atrial fibrillation Maternal Grandfather   . Stroke Paternal Grandmother   . Mitral valve prolapse Paternal Grandmother      Prior to Admission medications   Medication Sig Start Date End Date Taking? Authorizing Provider  amitriptyline (ELAVIL) 50 MG tablet Take by mouth.    [provider]  ARIPiprazole (ABILIFY) 2 MG tablet TAKE 1 TABLET(2 MG) BY MOUTH EVERY DAY 05/25/20   [provider]  benzonatate (TESSALON) 200 MG capsule Take 1 capsule (200 mg total) by mouth 3 (three) times daily as needed for cough. 10/19/20   Tommie Sams, DO  busPIRone (BUSPAR) 30 MG tablet TAKE 1 TABLET(30 MG) BY MOUTH TWICE DAILY 10/18/19   [provider]  CONCERTA 27 MG CR tablet Take 27 mg by mouth at bedtime. 09/29/20   [provider]  etonogestrel-ethinyl estradiol (NUVARING) 0.12-0.015 MG/24HR vaginal ring Leave in place for 3 consecutive weeks, then remove for 1 week. 03/16/18   [provider]  FLUoxetine (PROZAC) 20 MG capsule Take 60 mg by mouth every morning. 12/08/20   [provider]  gabapentin (NEURONTIN) 300 MG capsule TAKE 1 CAPSULE(300 MG) BY MOUTH TWICE DAILY 08/01/20   [provider]  HYDROcodone-acetaminophen (NORCO/VICODIN) 5-325 MG tablet Take 1 tablet by mouth every 8 (eight) hours as needed. 09/25/20   [provider]  levonorgestrel (MIRENA) 20 MCG/24HR IUD 1 each by Intrauterine route once.    [provider]  metFORMIN (GLUCOPHAGE) 500 MG tablet Take by mouth. 06/11/20 06/11/21  [provider]  oxybutynin (DITROPAN-XL) 10 MG 24 hr tablet Take 10 mg by mouth daily. 11/24/20   [provider]  predniSONE (STERAPRED UNI-PAK 21 TAB) 10 MG (21) TBPK tablet Take as instructed on package (60, 50, 40, 30, 20, 10) 12/20/20   Bailey Mech, NP  promethazine-dextromethorphan (PROMETHAZINE-DM) 6.25-15 MG/5ML syrup Take 5 mLs by mouth 4 (four) times daily as needed  for cough. 10/19/20   Tommie Sams, DO  propranolol (INDERAL) 20 MG tablet TAKE 1 TABLET(20 MG) BY MOUTH TWICE DAILY 05/19/20   [provider]  dicyclomine (BENTYL) 10 MG capsule  04/19/18 10/19/20  [provider]    Physical Exam: Vitals:   12/31/20 0631 12/31/20 0723 12/31/20 0730 12/31/20 0823  BP:  123/68 118/78 (!) 104/55  Pulse:  85 78 64  Resp:  (!) 23 (!) 22 11  Temp: 97.7 F (36.5 C)     TempSrc: Oral     SpO2:  97% 96% 97%   General: Not in acute distress HEENT:       Eyes: PERRL, EOMI, no scleral icterus.       ENT: No discharge from the ears and nose, no pharynx injection, no tonsillar enlargement.        Neck: No JVD, no bruit, no mass felt. Heme: No neck lymph node enlargement. Cardiac: S1/S2, RRR, No murmurs, No gallops or rubs. Respiratory: No rales, wheezing, rhonchi or rubs. GI: Soft, nondistended, nontender, no  rebound pain, no organomegaly, BS present. GU: has left CVA tenderness and hematuria Ext: No pitting leg edema bilaterally. 1+DP/PT pulse bilaterally. Musculoskeletal: No joint deformities, No joint redness or warmth, no limitation of ROM in spin. Skin: No rashes.  Neuro: Alert, oriented X3, cranial nerves II-XII grossly intact, moves all extremities normally. Psych: Patient is not psychotic, no suicidal or hemocidal ideation.  Labs on Admission: I have personally reviewed following labs and imaging studies  CBC: Recent Labs  Lab 12/31/20 0636  WBC 8.4  NEUTROABS 4.2  HGB 13.0  HCT 38.0  MCV 85.6  PLT 284   Basic Metabolic Panel: Recent Labs  Lab 12/31/20 0636  NA 137  K 3.9  CL 106  CO2 23  GLUCOSE 119*  BUN 8  CREATININE 0.77  CALCIUM 8.4*   GFR: Estimated Creatinine Clearance: 116.1 mL/min (by C-G formula based on SCr of 0.77 mg/dL). Liver Function Tests: Recent Labs  Lab 12/31/20 0636  AST 20  ALT 25  ALKPHOS 71  BILITOT 0.7  PROT 6.6  ALBUMIN 3.3*   Recent Labs  Lab 12/31/20 0636  LIPASE 33    No results for input(s): AMMONIA in the last 168 hours. Coagulation Profile: Recent Labs  Lab 12/31/20 0855  INR 1.0   Cardiac Enzymes: No results for input(s): CKTOTAL, CKMB, CKMBINDEX, TROPONINI in the last 168 hours. BNP (last 3 results) No results for input(s): PROBNP in the last 8760 hours. HbA1C: No results for input(s): HGBA1C in the last 72 hours. CBG: No results for input(s): GLUCAP in the last 168 hours. Lipid Profile: No results for input(s): CHOL, HDL, LDLCALC, TRIG, CHOLHDL, LDLDIRECT in the last 72 hours. Thyroid Function Tests: No results for input(s): TSH, T4TOTAL, FREET4, T3FREE, THYROIDAB in the last 72 hours. Anemia Panel: No results for input(s): VITAMINB12, FOLATE, FERRITIN, TIBC, IRON, RETICCTPCT in the last 72 hours. Urine analysis:    Component Value Date/Time   COLORURINE YELLOW (A) 06/24/2016 1900   APPEARANCEUR CLEAR (A) 06/24/2016 1900   APPEARANCEUR Hazy 05/22/2014 2245   LABSPEC 1.019 06/24/2016 1900   LABSPEC 1.009 05/22/2014 2245   PHURINE 5.0 06/24/2016 1900   GLUCOSEU NEGATIVE 06/24/2016 1900   GLUCOSEU Negative 05/22/2014 2245   HGBUR 3+ (A) 06/24/2016 1900   BILIRUBINUR NEGATIVE 06/24/2016 1900   BILIRUBINUR Negative 05/22/2014 2245   KETONESUR NEGATIVE 06/24/2016 1900   PROTEINUR NEGATIVE 06/24/2016 1900   NITRITE NEGATIVE 06/24/2016 1900   LEUKOCYTESUR NEGATIVE 06/24/2016 1900   LEUKOCYTESUR Negative 05/22/2014 2245   Sepsis Labs: @LABRCNTIP (procalcitonin:4,lacticidven:4) )No results found for this or any previous visit (from the past 240 hour(s)).   Radiological Exams on Admission: CT Angio Chest PE W and/or Wo Contrast  Result Date: 12/31/2020 CLINICAL DATA:  Chest pain EXAM: CT ANGIOGRAPHY CHEST WITH CONTRAST TECHNIQUE: Multidetector CT imaging of the chest was performed using the standard protocol during bolus administration of intravenous contrast. Multiplanar CT image reconstructions and MIPs were obtained to evaluate the  vascular anatomy. CONTRAST:  6mL OMNIPAQUE IOHEXOL 350 MG/ML SOLN COMPARISON:  Chest x-ray from earlier in the same day. FINDINGS: Cardiovascular: Thoracic aorta shows no aneurysmal dilatation or dissection. Heart is mildly enlarged in size. No pericardial effusion is seen. No significant coronary calcifications are noted. Pulmonary artery shows a normal branching pattern bilaterally. No intraluminal filling defect is identified to suggest pulmonary embolism. Mediastinum/Nodes: Thoracic inlet is within normal limits. No sizable hilar or mediastinal adenopathy is noted esophagus as visualized is within normal limits. Lungs/Pleura: Lungs are well aerated bilaterally.  No focal infiltrate or sizable effusion is seen. No bony abnormality is noted. Upper Abdomen: Visualized upper abdomen is unremarkable. Musculoskeletal: No chest wall abnormality. No acute or significant osseous findings. Review of the MIP images confirms the above findings. IMPRESSION: No evidence of pulmonary emboli. No acute abnormality noted. Electronically Signed   By: Alcide Clever M.D.   On: 12/31/2020 08:07   DG Chest Portable 1 View  Result Date: 12/31/2020 CLINICAL DATA:  Chest pain EXAM: PORTABLE CHEST 1 VIEW COMPARISON:  06/24/2016 abdominal CT FINDINGS: Prominent heart size accentuated by low volumes. There is no edema, consolidation, effusion, or pneumothorax. Artifact from EKG leads IMPRESSION: 1. Prominent heart size that is accentuated by low volumes. 2. Clear lungs. Electronically Signed   By: Marnee Spring M.D.   On: 12/31/2020 07:40     EKG: I have personally reviewed.  Sinus rhythm, QTC 424, early R wave progression  Assessment/Plan Principal Problem:   Chest pain Active Problems:   Pyelonephritis   Diabetes mellitus without complication (HCC)   ADHD   Elevated troponin   Depression with anxiety   Chest pain and elevated troponin: trop 511.  CT angiogram is negative for PE.  Differential diagnosis includes  non-STEMI versus perimyocarditis. Dr. Juliann Pares of card is consulted  - admit to progressive unit as inpatient - IV heparin is started in ED -->will continue  -Trend Trop - Repeat EKG in the am  - prn Nitroglycerin, dilaudid, and aspirin - start lipitor 40 mg daily - Risk factor stratification: will check FLP and A1C  - check UDS - Pt is on propranolol which is for anxiety per patient  Pyelonephritis: -start IV Rocephin -Follow-up blood culture and urine culture  Diabetes mellitus without complication (HCC): No A1c available.  Blood sugar 119.  Patient is taking Metformin -Sliding scale insulin  ADHD and depression with anxiety: -Continue home Abilify, BuSpar, Prozac, Concerta -pt is also on propranolol for anxiety per patient   DVT ppx: on IV Heparin    Code Status: Full code Family Communication:   Yes, patient's partner at bed side Disposition Plan:  Anticipate discharge back to previous environment Consults called: Dr. Juliann Pares of cardiology Admission status and Level of care: Progressive Cardiac:  as inpt     Status is: Inpatient  Remains inpatient appropriate because:Inpatient level of care appropriate due to severity of illness   Dispo: The patient is from: Home              Anticipated d/c is to: Home              Patient currently is not medically stable to d/c.   Difficult to place patient No            Date of Service 12/31/2020    Lorretta Harp [facility]   If 7PM-7AM, please contact night-coverage [email or web address] 12/31/2020, 9:54 AM

## 2020-12-31 NOTE — Progress Notes (Signed)
*  PRELIMINARY RESULTS* Echocardiogram 2D Echocardiogram has been performed.  Cristela Blue 12/31/2020, 1:05 PM

## 2020-12-31 NOTE — Consult Note (Signed)
CARDIOLOGY CONSULT NOTE               Patient ID: Sandra Gibbs MRN: [id number] DOB/AGE: 1996-05-05 24 y.o.  Admit date: 12/31/2020 Referring Physician: Lorretta Harp, MD Primary Physician: Cathie Hoops, PA Primary Cardiologist: none Reason for Consultation: chest pain; elevated troponin  HPI: [name] is a 25 year old Caucasian female with PMH significant for nephrolithiasis s/p stent placement with recent stent removal, pyelonephritis, IBS and obesity who presents to [facility] with complaints of chest pain.  The patient reports that on last night she started having burning chest pain that is located in the epigastric and center area of her chest.  She reports that the pain sometimes radiates to both the right and left shoulders.  The patient denies having any associated symptoms; however, she reports that the pain is further aggravated by " taking a deep breath."  The patient denies having any history of cardiac disease or diabetes and she denies having any tobacco use or illicit drug use.  She reports having a family history of ASCVD (paternal). The patient reports that the pain has eased up significantly since her admission; however, the pain is still present.   ED course: ECG revealed normal sinus rhythm without ischemic changes, initial high-sensitivity troponin was elevated at 511 with subsequent 601, CT angio of the chest was negative for pulmonary embolism or any acute abnormalities and CXR revealed a Prominent heart size that is accentuated by low volumes.  Patient's albumin level was decreased at 3.3.  All other laboratory blood work was unremarkable.  COVID-19 PCR is pending.  Patient's vital signs were stable.  The patient was placed on a heparin drip and cardiology was consulted.   Review of systems complete and found to be negative unless listed above     Past Medical History:  Diagnosis Date  . ADHD   . Diabetes mellitus without complication [facility])     Past  Surgical History:  Procedure Laterality Date  . CYST REMOVAL TRUNK  2019  . GALLBLADDER SURGERY  2016  . KIDNEY SURGERY    . OVARIAN CYST SURGERY  2011  . TONSILLECTOMY      (Not in a hospital admission)  Social History   Socioeconomic History  . Marital status: Single    Spouse name: Not on file  . Number of children: Not on file  . Years of education: Not on file  . Highest education level: Not on file  Occupational History  . Not on file  Tobacco Use  . Smoking status: Never Smoker  . Smokeless tobacco: Never Used  Vaping Use  . Vaping Use: Never used  Substance and Sexual Activity  . Alcohol use: No  . Drug use: No  . Sexual activity: Not on file  Other Topics Concern  . Not on file  Social History Narrative  . Not on file   Social Determinants of Health   Financial Resource Strain: Not on file  Food Insecurity: Not on file  Transportation Needs: Not on file  Physical Activity: Not on file  Stress: Not on file  Social Connections: Not on file  Intimate Partner Violence: Not on file    Family History  Problem Relation Age of Onset  . Heart murmur Mother   . Mitral valve prolapse Mother   . Heart attack Father   . Atrial fibrillation Maternal Grandfather   . Stroke Paternal Grandmother   . Mitral valve prolapse Paternal Grandmother  Review of systems complete and found to be negative unless listed above      PHYSICAL EXAM  General: Well developed, well nourished, in no acute distress HEENT:  Normocephalic and atraumatic Neck:  No JVD.  Supple.  Carotid pulses 2+ Lungs: Clear bilaterally to auscultation.  Normal effort of breathing.  No wheezes rales or rhonchi. Heart: HRRR . Normal S1 and S2 without gallops or murmurs.  Abdomen: Bowel sounds are positive, abdomen soft and non-tender  Msk:  Back normal. Normal strength and tone for age. Extremities: No clubbing, cyanosis or edema.   Neuro: Alert and oriented X 3. Psych:  Good affect,  responds appropriately  Labs:   Lab Results  Component Value Date   WBC 8.4 12/31/2020   HGB 13.0 12/31/2020   HCT 38.0 12/31/2020   MCV 85.6 12/31/2020   PLT 284 12/31/2020    Recent Labs  Lab 12/31/20 0636  NA 137  K 3.9  CL 106  CO2 23  BUN 8  CREATININE 0.77  CALCIUM 8.4*  PROT 6.6  BILITOT 0.7  ALKPHOS 71  ALT 25  AST 20  GLUCOSE 119*   No results found for: CKTOTAL, CKMB, CKMBINDEX, TROPONINI No results found for: CHOL No results found for: HDL No results found for: LDLCALC No results found for: TRIG No results found for: CHOLHDL No results found for: LDLDIRECT    Radiology: CT Angio Chest PE W and/or Wo Contrast  Result Date: 12/31/2020 CLINICAL DATA:  Chest pain EXAM: CT ANGIOGRAPHY CHEST WITH CONTRAST TECHNIQUE: Multidetector CT imaging of the chest was performed using the standard protocol during bolus administration of intravenous contrast. Multiplanar CT image reconstructions and MIPs were obtained to evaluate the vascular anatomy. CONTRAST:  53mL OMNIPAQUE IOHEXOL 350 MG/ML SOLN COMPARISON:  Chest x-ray from earlier in the same day. FINDINGS: Cardiovascular: Thoracic aorta shows no aneurysmal dilatation or dissection. Heart is mildly enlarged in size. No pericardial effusion is seen. No significant coronary calcifications are noted. Pulmonary artery shows a normal branching pattern bilaterally. No intraluminal filling defect is identified to suggest pulmonary embolism. Mediastinum/Nodes: Thoracic inlet is within normal limits. No sizable hilar or mediastinal adenopathy is noted esophagus as visualized is within normal limits. Lungs/Pleura: Lungs are well aerated bilaterally. No focal infiltrate or sizable effusion is seen. No bony abnormality is noted. Upper Abdomen: Visualized upper abdomen is unremarkable. Musculoskeletal: No chest wall abnormality. No acute or significant osseous findings. Review of the MIP images confirms the above findings. IMPRESSION: No  evidence of pulmonary emboli. No acute abnormality noted. Electronically Signed   By: Alcide Clever M.D.   On: 12/31/2020 08:07   DG Chest Portable 1 View  Result Date: 12/31/2020 CLINICAL DATA:  Chest pain EXAM: PORTABLE CHEST 1 VIEW COMPARISON:  06/24/2016 abdominal CT FINDINGS: Prominent heart size accentuated by low volumes. There is no edema, consolidation, effusion, or pneumothorax. Artifact from EKG leads IMPRESSION: 1. Prominent heart size that is accentuated by low volumes. 2. Clear lungs. Electronically Signed   By: Marnee Spring M.D.   On: 12/31/2020 07:40    EKG: Normal sinus rhythm without any evidence of ischemic changes  ASSESSMENT AND PLAN:  [name] is a 25 year old Caucasian female with PMH significant for nephrolithiasis s/p stent placement with recent stent removal, pyelonephritis, IBS and obesity who presents to [facility] with complaints of chest pain.  The patient was noted to have an increasingly elevated troponin at 511>>601 with an unclear etiology.  The patient does not have any significant  ASCVD risk factors or enhancing factors. However, further evaluation is warranted at this time. Echocardiogram is recommended, continuing to trend high-sensitivity troponins x2 and possible cardiac catheterization for further evaluation.   1.  Chest pain with elevated troponin 511>>601  -ECG reveals normal sinus rhythm without any ischemic ST changes.  -Recommend trending high-sensitivity troponin x2.  -Obtain echocardiogram.  -We will place the patient on n.p.o. at midnight for possible cardiac catheterization.  -Agree with continuing heparin drip, admission to cardiac unit and continuous telemetry monitoring.  -Recommend heart healthy diet.  -Agree with aspirin and statin therapy, in addition to nitroglycerin as needed.  Consider starting beta-blocker therapy.  -Recommend GI cocktail for possible GERD management.   2.  Pyelonephritis  -Agree with current antibiotic  therapy.  -Blood culture and urine culture pending.   3.  DVT prophylaxis  -Recommend continuing heparin therapy.     Signed: Garnet Chatmon ACNPC-AG 12/31/2020, 1:06 PM

## 2020-12-31 NOTE — ED Triage Notes (Signed)
25 y/o female arrived to the [facility] by EMS with a CC of chest pain. PT states it started tonight a t 3am. PT notes she went to [facility] for flank pain the other day. Pt notes pain radiating to her left shoulder. Pt denies any significant cardiac history. Pt notes nausea and vomiting.. Pt is AOx4 at  This time

## 2020-12-31 NOTE — Progress Notes (Signed)
Patient arrived to unit from ED complaining of on-going mid-sternal chest pain 6/0, radiating to her shoulders. Pt was tearful and reported anxiety. Nitro x3 was administered without any report of change to patient's pain. Current blood pressure 93/58, with pulse of 78. MDs notified and awaiting response.

## 2020-12-31 NOTE — ED Provider Notes (Signed)
[facility] Emergency Department Provider Note  ____________________________________________   Event Date/Time   First MD Initiated Contact with Patient 12/31/20 [phone]     (approximate)  I have reviewed the triage vital signs and the nursing notes.   HISTORY  Chief Complaint Chest Pain    HPI Sandra Gibbs is a 25 y.o. female with medical history as listed below who presents by EMS for chest pain.  The patient reports that she is having sharp pain in the upper middle part of her chest.  She also feels somewhat short of breath.  She started feeling the symptoms earlier last night and they did not go away and were preventing her from sleeping and became severe.  Nothing particular makes it feel better or worse.  She feels anxious as well and has had a history of panic attacks and anxiety in the past.  Denies nausea and vomiting.  Denies rash.  Patient has no history of blood clots in the legs of the lungs.  She is not on birth control but has an IUD.  She was seen at [facility] a little over 24 hours ago and had a fever and some lower abdominal pain.  She was diagnosed with a urinary tract infection that may have gone to her kidneys as well as mesenteric adenitis based on the CT scan.  She was treated with ceftriaxone 1 g IV in the ED and discharged on antibiotics which she has been taking.         Past Medical History:  Diagnosis Date  . ADHD   . Diabetes mellitus without complication [facility])     Patient Active Problem List   Diagnosis Date Noted  . Kidney stones     Past Surgical History:  Procedure Laterality Date  . CYST REMOVAL TRUNK  2019  . GALLBLADDER SURGERY  2016  . KIDNEY SURGERY    . OVARIAN CYST SURGERY  2011  . TONSILLECTOMY      Prior to Admission medications   Medication Sig Start Date End Date Taking? Authorizing Provider  amitriptyline (ELAVIL) 50 MG tablet Take by mouth.    [provider]  ARIPiprazole (ABILIFY) 2 MG  tablet TAKE 1 TABLET(2 MG) BY MOUTH EVERY DAY 05/25/20   [provider]  benzonatate (TESSALON) 200 MG capsule Take 1 capsule (200 mg total) by mouth 3 (three) times daily as needed for cough. 10/19/20   Tommie Sams, DO  busPIRone (BUSPAR) 30 MG tablet TAKE 1 TABLET(30 MG) BY MOUTH TWICE DAILY 10/18/19   [provider]  CONCERTA 27 MG CR tablet Take 27 mg by mouth at bedtime. 09/29/20   [provider]  etonogestrel-ethinyl estradiol (NUVARING) 0.12-0.015 MG/24HR vaginal ring Leave in place for 3 consecutive weeks, then remove for 1 week. 03/16/18   [provider]  FLUoxetine (PROZAC) 20 MG capsule Take 60 mg by mouth every morning. 12/08/20   [provider]  gabapentin (NEURONTIN) 300 MG capsule TAKE 1 CAPSULE(300 MG) BY MOUTH TWICE DAILY 08/01/20   [provider]  HYDROcodone-acetaminophen (NORCO/VICODIN) 5-325 MG tablet Take 1 tablet by mouth every 8 (eight) hours as needed. 09/25/20   [provider]  levonorgestrel (MIRENA) 20 MCG/24HR IUD 1 each by Intrauterine route once.    [provider]  metFORMIN (GLUCOPHAGE) 500 MG tablet Take by mouth. 06/11/20 06/11/21  [provider]  oxybutynin (DITROPAN-XL) 10 MG 24 hr tablet Take 10 mg by mouth daily. 11/24/20   [provider]  predniSONE (STERAPRED UNI-PAK 21 TAB) 10 MG (21) TBPK tablet Take as instructed on package (60, 50, 40, 30, 20, 10) 12/20/20   Bailey Mech, NP  promethazine-dextromethorphan (PROMETHAZINE-DM) 6.25-15 MG/5ML syrup Take 5 mLs by mouth 4 (four) times daily as needed for cough. 10/19/20   Tommie Sams, DO  propranolol (INDERAL) 20 MG tablet TAKE 1 TABLET(20 MG) BY MOUTH TWICE DAILY 05/19/20   [provider]  dicyclomine (BENTYL) 10 MG capsule  04/19/18 10/19/20  [provider]    Allergies Patient has no known allergies.  Family History  Problem Relation Age of Onset  . Heart murmur Mother   . Mitral valve prolapse  Mother   . Heart attack Father   . Atrial fibrillation Maternal Grandfather   . Stroke Paternal Grandmother   . Mitral valve prolapse Paternal Grandmother     Social History Social History   Tobacco Use  . Smoking status: Never Smoker  . Smokeless tobacco: Never Used  Vaping Use  . Vaping Use: Never used  Substance Use Topics  . Alcohol use: No  . Drug use: No    Review of Systems Constitutional: Fever several days ago, now resolved. Eyes: No visual changes. ENT: No sore throat. Cardiovascular: Positive for chest pain. Respiratory: Positive for shortness of breath. Gastrointestinal: Lower abdominal pain several days ago, not currently.  No nausea, no vomiting.  No diarrhea.  No constipation. Genitourinary: Negative for dysuria. Musculoskeletal: Negative for neck pain.  Negative for back pain. Integumentary: Negative for rash. Neurological: Negative for headaches, focal weakness or numbness.   ____________________________________________   PHYSICAL EXAM:  VITAL SIGNS: ED Triage Vitals  Enc Vitals Group     BP 12/31/20 0629 (!) 121/94     Pulse Rate 12/31/20 0629 83     Resp 12/31/20 0629 20     Temp 12/31/20 0631 97.7 F (36.5 C)     Temp Source 12/31/20 0631 Oral     SpO2 12/31/20 0629 96 %     Weight --      Height --      Head Circumference --      Peak Flow --      Pain Score 12/31/20 0628 7     Pain Loc --      Pain Edu? --      Excl. in GC? --     Constitutional: Alert and oriented.  Anxious. Eyes: Conjunctivae are normal.  Head: Atraumatic. Nose: No congestion/rhinnorhea. Mouth/Throat: Patient is wearing a mask. Neck: No stridor.  No meningeal signs.   Cardiovascular: Normal rate, regular rhythm. Good peripheral circulation. Respiratory: Tachypnea but without intercostal retractions or accessory muscle usage.  No wheezing or abnormal breath sounds.  Speaking in full sentences. Gastrointestinal: Obese.  Soft and nontender. No distention.   Musculoskeletal: No lower extremity tenderness nor edema. No gross deformities of extremities. Neurologic:  Normal speech and language. No gross focal neurologic deficits are appreciated.  Skin:  Skin is warm, dry and intact. Psychiatric: Mood and affect are anxious and worried.  No warning signs or symptoms concerning for a psychiatric emergency, but presentation is consistent with anxiety or panic attack.  ____________________________________________   LABS (all labs ordered are listed, but only abnormal results are displayed)  Labs Reviewed  CBC WITH DIFFERENTIAL/PLATELET - Abnormal; Notable for the following components:      Result Value   Monocytes Absolute 1.2 (*)    All other components within normal limits   CMP, lipase, troponin  pending at time of signout. ____________________________________________  EKG  ED ECG REPORT I, Loleta Rose, the attending physician, personally viewed and interpreted this ECG.  Date: 12/31/2020 EKG Time: 6:31 AM Rate: 72 Rhythm: normal sinus rhythm QRS Axis: normal Intervals: normal ST/T Wave abnormalities: normal Narrative Interpretation: no evidence of acute ischemia  ____________________________________________  RADIOLOGY CXR pending at time of signout ____________________________________________   PROCEDURES   Procedure(s) performed (including Critical Care):  Procedures   ____________________________________________   INITIAL IMPRESSION / MDM / ASSESSMENT AND PLAN / ED COURSE  As part of my medical decision making, I reviewed the following data within the electronic MEDICAL RECORD NUMBER History obtained from family, Nursing notes reviewed and incorporated, Labs reviewed , Old chart reviewed, Patient signed out to Dr. Scotty Court and reviewed Notes from prior ED visits   Differential diagnosis includes, but is not limited to, anxiety/panic attack, musculoskeletal chest wall pain, pneumonia, PE, ACS, biliary colic radiating to  the chest, pancreatitis radiating to the chest, medication or drug side effect.  The patient is on the cardiac monitor to evaluate for evidence of arrhythmia and/or significant heart rate changes.  Vital signs are stable and within normal limits including no tachycardia and no hypoxemia.  Patient is obviously anxious and concerned about her symptoms but she is low risk for ACS.  Wells score for PE is 0 and she is PERC negative unless the IUD counts as "exogenous estrogen".  She currently has no infectious symptoms even though she is on antibiotics and was recently treated for UTI/pyonephritis and mesenteric adenitis.  We will check basic labs and a chest x-ray and provide Ativan 1 mg IV.  Anticipate discharge if the work-up is reassuring and the patient feels better after the Ativan..  Transferring ED care to Dr. Scotty Court at 7:00 AM to follow-up and reassess the patient.  CBC is back and reassuring with no leukocytosis, the rest of the labs and chest x-ray are pending.          ____________________________________________  FINAL CLINICAL IMPRESSION(S) / ED DIAGNOSES  Final diagnoses:  Chest pain, unspecified type     MEDICATIONS GIVEN DURING THIS VISIT:  Medications  LORazepam (ATIVAN) injection 1 mg (1 mg Intravenous Given 12/31/20 0640)  morphine 4 MG/ML injection 4 mg (4 mg Intravenous Given 12/31/20 0732)     ED Discharge Orders    None      *Please note:  Sandra Gibbs was evaluated in Emergency Department on 12/31/2020 for the symptoms described in the history of present illness. She was evaluated in the context of the global COVID-19 pandemic, which necessitated consideration that the patient might be at risk for infection with the SARS-CoV-2 virus that causes COVID-19. Institutional protocols and algorithms that pertain to the evaluation of patients at risk for COVID-19 are in a state of rapid change based on information released by regulatory bodies including the CDC and  federal and state organizations. These policies and algorithms were followed during the patient's care in the ED.  Some ED evaluations and interventions may be delayed as a result of limited staffing during and after the pandemic.*  Note:  This document was prepared using Dragon voice recognition software and may include unintentional dictation errors.   Loleta Rose, MD 12/31/20 [phone]

## 2020-12-31 NOTE — ED Provider Notes (Signed)
Procedures     ----------------------------------------- 8:53 AM on 12/31/2020 ----------------------------------------- Troponin result is 500.  Patient reports worsening central chest pain that is sharp and worse with breathing.  CT angiogram obtained which is negative for PE or other acute issue.  Suspect myocarditis versus non-STEMI.  Heparin ordered, case discussed with hospitalist for further management.     Sharman Cheek, MD 12/31/20 [phone]

## 2021-01-01 ENCOUNTER — Encounter: Payer: Self-pay | Admitting: Internal Medicine

## 2021-01-01 ENCOUNTER — Other Ambulatory Visit: Payer: Self-pay

## 2021-01-01 ENCOUNTER — Encounter
Admission: EM | Disposition: A | Discharge status: Home / Self Care | Payer: Self-pay | Source: Home / Self Care | Attending: Internal Medicine

## 2021-01-01 DIAGNOSIS — R7989 Other specified abnormal findings of blood chemistry: Secondary | ICD-10-CM | POA: Diagnosis not present

## 2021-01-01 DIAGNOSIS — I5181 Takotsubo syndrome: Secondary | ICD-10-CM | POA: Diagnosis not present

## 2021-01-01 DIAGNOSIS — I5021 Acute systolic (congestive) heart failure: Secondary | ICD-10-CM | POA: Diagnosis not present

## 2021-01-01 DIAGNOSIS — N12 Tubulo-interstitial nephritis, not specified as acute or chronic: Secondary | ICD-10-CM

## 2021-01-01 DIAGNOSIS — R079 Chest pain, unspecified: Secondary | ICD-10-CM | POA: Diagnosis not present

## 2021-01-01 DIAGNOSIS — I208 Other forms of angina pectoris: Secondary | ICD-10-CM | POA: Diagnosis not present

## 2021-01-01 DIAGNOSIS — I514 Myocarditis, unspecified: Secondary | ICD-10-CM | POA: Diagnosis not present

## 2021-01-01 HISTORY — PX: LEFT HEART CATH AND CORONARY ANGIOGRAPHY: CATH118249

## 2021-01-01 LAB — CBC
HCT: 38.8 % (ref 36.0–46.0)
Hemoglobin: 13 g/dL (ref 12.0–15.0)
MCH: 29.3 pg (ref 26.0–34.0)
MCHC: 33.5 g/dL (ref 30.0–36.0)
MCV: 87.6 fL (ref 80.0–100.0)
Platelets: 293 10*3/uL (ref 150–400)
RBC: 4.43 MIL/uL (ref 3.87–5.11)
RDW: 13 % (ref 11.5–15.5)
WBC: 6.6 10*3/uL (ref 4.0–10.5)
nRBC: 0 % (ref 0.0–0.2)

## 2021-01-01 LAB — BASIC METABOLIC PANEL
Anion gap: 7 (ref 5–15)
BUN: 10 mg/dL (ref 6–20)
CO2: 26 mmol/L (ref 22–32)
Calcium: 8.8 mg/dL — ABNORMAL LOW (ref 8.9–10.3)
Chloride: 109 mmol/L (ref 98–111)
Creatinine, Ser: 0.71 mg/dL (ref 0.44–1.00)
GFR, Estimated: >60 mL/min (ref >60)
Glucose, Bld: 97 mg/dL (ref 70–99)
Potassium: 4.1 mmol/L (ref 3.5–5.1)
Sodium: 142 mmol/L (ref 135–145)

## 2021-01-01 LAB — LIPID PANEL
Cholesterol: 187 mg/dL (ref 0–200)
HDL: 35 mg/dL — ABNORMAL LOW (ref >40)
LDL Cholesterol: 108 mg/dL — ABNORMAL HIGH (ref 0–99)
Total CHOL/HDL Ratio: 5.3 RATIO
Triglycerides: 219 mg/dL — ABNORMAL HIGH (ref <150)
VLDL: 44 mg/dL — ABNORMAL HIGH (ref 0–40)

## 2021-01-01 LAB — GLUCOSE, CAPILLARY
Glucose-Capillary: 102 mg/dL — ABNORMAL HIGH (ref 70–99)
Glucose-Capillary: 99 mg/dL (ref 70–99)
Glucose-Capillary: 120 mg/dL — ABNORMAL HIGH (ref 70–99)

## 2021-01-01 LAB — HIV ANTIBODY (ROUTINE TESTING W REFLEX): HIV Screen 4th Generation wRfx: NONREACTIVE

## 2021-01-01 SURGERY — LEFT HEART CATH AND CORONARY ANGIOGRAPHY
Anesthesia: Moderate Sedation

## 2021-01-01 MED ORDER — FENTANYL CITRATE (PF) 100 MCG/2ML IJ SOLN
INTRAMUSCULAR | Status: DC | PRN
  Administered 2021-01-01 (×3): 25 ug via INTRAVENOUS

## 2021-01-01 MED ORDER — ACETAMINOPHEN 325 MG PO TABS
650.0000 mg | ORAL_TABLET | ORAL | Status: DC | PRN
  Administered 2021-01-01: 650 mg via ORAL
  Filled 2021-01-01: qty 2

## 2021-01-01 MED ORDER — HEPARIN (PORCINE) IN NACL 1000-0.9 UT/500ML-% IV SOLN
INTRAVENOUS | Status: AC
  Filled 2021-01-01: qty 1000

## 2021-01-01 MED ORDER — HEPARIN (PORCINE) 25000 UT/250ML-% IV SOLN
1400.0000 [IU]/h | INTRAVENOUS | Status: DC
  Administered 2021-01-01: 1400 [IU]/h via INTRAVENOUS
  Filled 2021-01-01: qty 250

## 2021-01-01 MED ORDER — SODIUM CHLORIDE 0.9 % IV SOLN
250.0000 mL | INTRAVENOUS | Status: DC | PRN
Start: 2021-01-01 — End: 2021-01-02

## 2021-01-01 MED ORDER — HEPARIN BOLUS VIA INFUSION
1100.0000 [IU] | Freq: Once | INTRAVENOUS | Status: AC
  Administered 2021-01-01: 1100 [IU] via INTRAVENOUS
  Filled 2021-01-01: qty 1100

## 2021-01-01 MED ORDER — HYDRALAZINE HCL 20 MG/ML IJ SOLN: 10.0000 mg | INTRAMUSCULAR | Status: AC | PRN

## 2021-01-01 MED ORDER — SODIUM CHLORIDE 0.9% FLUSH
3.0000 mL | Freq: Two times a day (BID) | INTRAVENOUS | Status: DC
  Administered 2021-01-01 – 2021-01-02 (×2): 3 mL via INTRAVENOUS

## 2021-01-01 MED ORDER — MIDAZOLAM HCL 2 MG/2ML IJ SOLN
INTRAMUSCULAR | Status: AC
  Filled 2021-01-01: qty 2

## 2021-01-01 MED ORDER — FENTANYL CITRATE (PF) 100 MCG/2ML IJ SOLN
INTRAMUSCULAR | Status: AC
  Filled 2021-01-01: qty 2

## 2021-01-01 MED ORDER — IOHEXOL 300 MG/ML  SOLN
INTRAMUSCULAR | Status: DC | PRN
  Administered 2021-01-01: 71 mL

## 2021-01-01 MED ORDER — MIDAZOLAM HCL 2 MG/2ML IJ SOLN
INTRAMUSCULAR | Status: DC | PRN
  Administered 2021-01-01 (×2): 0.5 mg via INTRAVENOUS

## 2021-01-01 MED ORDER — SODIUM CHLORIDE 0.9% FLUSH: 3.0000 mL | INTRAVENOUS | Status: DC | PRN

## 2021-01-01 MED ORDER — ONDANSETRON HCL 4 MG/2ML IJ SOLN: 4.0000 mg | Freq: Four times a day (QID) | INTRAMUSCULAR | Status: DC | PRN

## 2021-01-01 MED ORDER — LABETALOL HCL 5 MG/ML IV SOLN: 10.0000 mg | INTRAVENOUS | Status: AC | PRN

## 2021-01-01 MED ORDER — SODIUM CHLORIDE 0.9 % WEIGHT BASED INFUSION: 1.0000 mL/kg/h | INTRAVENOUS | Status: AC

## 2021-01-01 MED ORDER — HEPARIN (PORCINE) IN NACL 2000-0.9 UNIT/L-% IV SOLN
INTRAVENOUS | Status: DC | PRN
  Administered 2021-01-01: 1000 mL

## 2021-01-01 SURGICAL SUPPLY — 15 items
CATH INFINITI 5FR JL4 (CATHETERS) ×2 IMPLANT
CATH INFINITI JR4 5F (CATHETERS) ×2 IMPLANT
DEVICE CLOSURE MYNXGRIP 5F (Vascular Products) ×2 IMPLANT
DRAPE BRACHIAL (DRAPES) ×2 IMPLANT
GLIDESHEATH SLEND SS 6F .021 (SHEATH) IMPLANT
GUIDEWIRE INQWIRE 1.5J.035X260 (WIRE) IMPLANT
INQWIRE 1.5J .035X260CM (WIRE)
KIT MANI 3VAL PERCEP (MISCELLANEOUS) IMPLANT
KIT SYRINGE INJ CVI SPIKEX1 (MISCELLANEOUS) ×2 IMPLANT
NEEDLE PERC 18GX7CM (NEEDLE) ×2 IMPLANT
PACK CARDIAC CATH (CUSTOM PROCEDURE TRAY) ×2 IMPLANT
SET ATX SIMPLICITY (MISCELLANEOUS) ×2 IMPLANT
SHEATH AVANTI 5FR X 11CM (SHEATH) ×2 IMPLANT
SHEATH AVANTI 6FR X 11CM (SHEATH) IMPLANT
WIRE GUIDERIGHT .035X150 (WIRE) ×2 IMPLANT

## 2021-01-01 NOTE — Progress Notes (Signed)
[facility] Cardiology  Patient Description:[name] is a 25 year old Caucasian female with PMH significant for nephrolithiasis s/p stent placement with recent stent removal, pyelonephritis, IBS and obesity who was admitted due to chest pain and elevated troponin.   SUBJECTIVE:The patient states that she is feeling "okay" today and reports that the chest pain has improved some. She denies having any dyspnea at this time.   OBJECTIVE: The patient is resting comfortably in bed with her family at the bedside and she is s/p cardiac catheterization which revealed normal coronary anatomy.  The patient's vital signs are stable, she appears euvolemic and is Gibbs apparent distress at this time.   Vitals:   01/01/21 0901 01/01/21 0930 01/01/21 0945 01/01/21 1000  BP: 98/66 103/61 104/65 (!) 103/52  Pulse: (!) 57   (!) 105  Resp: 18   14  Temp:      TempSrc:      SpO2: 96% 98%  (!) 80%  Weight:      Height:         Intake/Output Summary (Last 24 hours) at 01/01/2021 1032 Last data filed at 12/31/2020 2117 Gross per 24 hour  Intake --  Output 250 ml  Net -250 ml      PHYSICAL EXAM  General: Well developed, well nourished, in Gibbs acute distress HEENT:  Normocephalic and atraumatic Neck:   Gibbs JVD.  Supple.  Carotid pulses 2+ Lungs: Clear bilaterally to auscultation.  Normal effort of breathing.  Gibbs wheezes rales or rhonchi. Heart: HRRR . Normal S1 and S2 without gallops or murmurs.  Abdomen: Bowel sounds are positive, abdomen soft and non-tender  Msk:  Back normal. Normal strength and tone for age. Extremities: Gibbs clubbing, cyanosis or edema.   Neuro: Alert and oriented X 3. Psych:  Good affect, responds appropriately  LABS: Basic Metabolic Panel: Recent Labs    12/31/20 0636 01/01/21 0423  NA 137 142  K 3.9 4.1  CL 106 109  CO2 23 26  GLUCOSE 119* 97  BUN 8 10  CREATININE 0.77 0.71  CALCIUM 8.4* 8.8*   Liver Function Tests: Recent Labs    12/31/20 0636  AST 20  ALT 25   ALKPHOS 71  BILITOT 0.7  PROT 6.6  ALBUMIN 3.3*   Recent Labs    12/31/20 0636  LIPASE 33   CBC: Recent Labs    12/31/20 0636 01/01/21 0423  WBC 8.4 6.6  NEUTROABS 4.2  --   HGB 13.0 13.0  HCT 38.0 38.8  MCV 85.6 87.6  PLT 284 293   Cardiac Enzymes: Gibbs results for input(s): CKTOTAL, CKMB, CKMBINDEX, TROPONINI in the last 72 hours. BNP: Invalid input(s): POCBNP D-Dimer: Gibbs results for input(s): DDIMER in the last 72 hours. Hemoglobin A1C: Recent Labs    12/31/20 0911  HGBA1C 5.4   Fasting Lipid Panel: Recent Labs    01/01/21 0423  CHOL 187  HDL 35*  LDLCALC 108*  TRIG 219*  CHOLHDL 5.3   Thyroid Function Tests: Gibbs results for input(s): TSH, T4TOTAL, T3FREE, THYROIDAB in the last 72 hours.  Invalid input(s): FREET3 Anemia Panel: Gibbs results for input(s): VITAMINB12, FOLATE, FERRITIN, TIBC, IRON, RETICCTPCT in the last 72 hours.  CT Angio Chest PE W and/or Wo Contrast  Result Date: 12/31/2020 CLINICAL DATA:  Chest pain EXAM: CT ANGIOGRAPHY CHEST WITH CONTRAST TECHNIQUE: Multidetector CT imaging of the chest was performed using the standard protocol during bolus administration of intravenous contrast. Multiplanar CT image reconstructions and MIPs were obtained to evaluate the  vascular anatomy. CONTRAST:  21mL OMNIPAQUE IOHEXOL 350 MG/ML SOLN COMPARISON:  Chest x-ray from earlier in the same day. 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 Heart is mildly enlarged in size. Gibbs pericardial effusion is seen. Gibbs significant coronary calcifications are noted. Pulmonary artery shows a normal branching pattern bilaterally. Gibbs intraluminal filling defect is identified to suggest pulmonary embolism. Mediastinum/Nodes: Thoracic inlet is within normal limits. Gibbs sizable hilar or mediastinal adenopathy is noted esophagus as visualized is within normal limits. Lungs/Pleura: Lungs are well aerated bilaterally. Gibbs focal infiltrate or  sizable effusion is seen. Gibbs bony abnormality is noted. Upper Abdomen: Visualized upper abdomen is unremarkable. Musculoskeletal: Gibbs chest wall abnormality. Gibbs acute or significant osseous findings. Review of the MIP images confirms the above findings. IMPRESSION: Gibbs evidence of pulmonary emboli. Gibbs acute abnormality noted. Electronically Signed   By: Alcide Clever M.D.   On: 12/31/2020 08:07   CARDIAC CATHETERIZATION  Result Date: 01/01/2021  Normal coronaries  Low normal left ventricular function 50 to 55%  Conclusion Normal coronaries Preserved left ventricular function between 50 and 55% Elevated troponins are probably related to possible myocarditis Recommend medical therapy   DG Chest Portable 1 View  Result Date: 12/31/2020 CLINICAL DATA:  Chest pain EXAM: PORTABLE CHEST 1 VIEW COMPARISON:  06/24/2016 abdominal CT FINDINGS: Prominent heart size accentuated by low volumes. There is Gibbs edema, consolidation, effusion, or pneumothorax. Artifact from EKG leads IMPRESSION: 1. Prominent heart size that is accentuated by low volumes. 2. Clear lungs. Electronically Signed   By: Marnee Spring M.D.   On: 12/31/2020 07:40   ECHOCARDIOGRAM COMPLETE  Result Date: 12/31/2020    ECHOCARDIOGRAM REPORT   Patient Name:   Sandra Gibbs Date of Exam: 12/31/2020 Medical Rec #:  [id number]        Height:       60.0 in Accession #:    [id number]       Weight:       230.0 lb Date of Birth:  12/05/1995        BSA:          1.981 m Patient Age:    24 years         BP:           110/73 mmHg Patient Gender: F                HR:           65 bpm. Exam Location:  [facility] Procedure: 2D Echo, Cardiac Doppler and Color Doppler Indications:     Elevated troponin  History:         Patient has Gibbs prior history of Echocardiogram examinations.                  Risk Factors:Diabetes. ADHD.  Sonographer:     Cristela Blue RDCS (AE) Referring Phys:  [id number] [facility] Floride Hutmacher Diagnosing Phys: Alwyn Pea MD  Sonographer Comments:  Suboptimal apical window and Gibbs subcostal window. IMPRESSIONS  1. Inferior Hypo.  2. Left ventricular ejection fraction, by estimation, is 45 to 50%. The left ventricle has mildly decreased function. The left ventricle demonstrates regional wall motion abnormalities (see scoring diagram/findings for description). The left ventricular  internal cavity size was mildly dilated. Left ventricular diastolic parameters were normal.  3. Right ventricular systolic function is low normal. The right ventricular size is normal.  4. The mitral valve is normal in structure. Gibbs evidence of mitral valve regurgitation.  5. The aortic valve is grossly normal. Aortic valve regurgitation is not visualized. FINDINGS  Left Ventricle: Left ventricular ejection fraction, by estimation, is 45 to 50%. The left ventricle has mildly decreased function. The left ventricle demonstrates regional wall motion abnormalities. The left ventricular internal cavity size was mildly dilated. There is Gibbs left ventricular hypertrophy. Left ventricular diastolic parameters were normal. Right Ventricle: The right ventricular size is normal. Gibbs increase in right ventricular wall thickness. Right ventricular systolic function is low normal. Left Atrium: Left atrial size was normal in size. Right Atrium: Right atrial size was normal in size. Pericardium: The pericardium was not well visualized. Mitral Valve: The mitral valve is normal in structure. Gibbs evidence of mitral valve regurgitation. Tricuspid Valve: The tricuspid valve is grossly normal. Tricuspid valve regurgitation is trivial. Aortic Valve: The aortic valve is grossly normal. Aortic valve regurgitation is not visualized. Aortic valve mean gradient measures 3.0 mmHg. Aortic valve peak gradient measures 5.4 mmHg. Aortic valve area, by VTI measures 2.18 cm. Pulmonic Valve: The pulmonic valve was normal in structure. Pulmonic valve regurgitation is not visualized. Aorta: The aortic arch was not well  visualized. IAS/Shunts: Gibbs atrial level shunt detected by color flow Doppler. Additional Comments: Inferior Hypo.  LEFT VENTRICLE PLAX 2D LVIDd:         4.59 cm  Diastology LVIDs:         3.06 cm  LV e' medial:    12.60 cm/s LV PW:         1.16 cm  LV E/e' medial:  7.4 LV IVS:        0.91 cm  LV e' lateral:   13.80 cm/s LVOT diam:     2.00 cm  LV E/e' lateral: 6.8 LV SV:         58 LV SV Index:   29 LVOT Area:     3.14 cm  RIGHT VENTRICLE RV Basal diam:  2.71 cm RV S prime:     9.90 cm/s TAPSE (M-mode): 3.6 cm LEFT ATRIUM             Index       RIGHT ATRIUM           Index LA diam:        3.20 cm 1.62 cm/m  RA Area:     20.10 cm LA Vol (A2C):   53.6 ml 27.06 ml/m RA Volume:   58.40 ml  29.48 ml/m LA Vol (A4C):   39.3 ml 19.84 ml/m LA Biplane Vol: 45.9 ml 23.17 ml/m  AORTIC VALVE                   PULMONIC VALVE AV Area (Vmax):    1.84 cm    PV Vmax:        0.57 m/s AV Area (Vmean):   2.14 cm    PV Peak grad:   1.3 mmHg AV Area (VTI):     2.18 cm    RVOT Peak grad: 1 mmHg AV Vmax:           116.00 cm/s AV Vmean:          78.800 cm/s AV VTI:            0.268 m AV Peak Grad:      5.4 mmHg AV Mean Grad:      3.0 mmHg LVOT Vmax:         67.90 cm/s LVOT Vmean:        53.600  cm/s LVOT VTI:          0.186 m LVOT/AV VTI ratio: 0.69  AORTA Ao Root diam: 2.60 cm MITRAL VALVE               TRICUSPID VALVE MV Area (PHT): 3.56 cm    TR Peak grad:   10.1 mmHg MV Decel Time: 213 msec    TR Vmax:        159.00 cm/s MV E velocity: 93.80 cm/s MV A velocity: 41.10 cm/s  SHUNTS MV E/A ratio:  2.28        Systemic VTI:  0.19 m                            Systemic Diam: 2.00 cm Alwyn Pea MD Electronically signed by Alwyn Pea MD Signature Date/Time: 12/31/2020/1:18:46 PM    Final      Echo: IMPRESSIONS  1. Inferior Hypo.  2. Left ventricular ejection fraction, by estimation, is 45 to 50%. The  left ventricle has mildly decreased function. The left ventricle  demonstrates regional wall motion  abnormalities (see scoring  diagram/findings for description). The left ventricular  internal cavity size was mildly dilated. Left ventricular diastolic  parameters were normal.  3. Right ventricular systolic function is low normal. The right  ventricular size is normal.  4. The mitral valve is normal in structure. Gibbs evidence of mitral valve  regurgitation.  5. The aortic valve is grossly normal. Aortic valve regurgitation is not  visualized.   TELEMETRY: NSR   ASSESSMENT AND PLAN: [name] is a 25 year old Caucasian female with PMH significant for nephrolithiasis s/p stent placement with recent stent removal, pyelonephritis, IBS and obesity who was admitted due to chest pain. The patient was noted to have an increasingly elevated troponin at 511 initially and a peak of 601 with an unclear etiology.  The patient underwent cardiac catheterization this morning which revealed normal coronary anatomy and a mildly reduced LV function.  The echocardiogram revealed an estimated EF of 45-50%.  Differential diagnosis at this time includes myocarditis and a form of Takotsubo.  There are Gibbs further recommendations of medication changes at this time.    Principal Problem:   Chest pain Active Problems:   Pyelonephritis   Diabetes mellitus without complication (HCC)   ADHD   Elevated troponin   Depression with anxiety    1. Chest pain with elevated troponin with possible differential diagnoses of myocarditis versus a form of Takotsubo  -Troponin 511>> 601>> 472>> 557>> 512, not due to ischemia.   -discontinue heparin gtt.              -Cardiogram reveals inferior hypokinesis with an mildly decreased LV function with estimated EF of 45-50% in addition to a mildly dilated LV.             -Cardiac catheterization revealed normal coronary anatomy.             -Continuous telemetry monitoring until discharge.             -Recommend heart healthy diet  -Avoid NSAID use, alcohol intake and  excercise at this time.   -We will place the patient on low dose beta blocker, ACEi and diuretic for LV dysfunction management.   -Recommend healthy stress management techniques including relaxation techniques, aromatherapy, deep breathing, etc.   -Follow up with Cardiology in 1 week post discharged from the hospital.   2.  Pyelonephritis             -  Agree with current antibiotic therapy.             -Blood culture and urine culture pending.   3.  DVT prophylaxis             -Recommend subQ heparin.   Matricia Begnaud, ACNPC-AG  01/01/2021 10:32 AM

## 2021-01-01 NOTE — Consult Note (Signed)
ANTICOAGULATION CONSULT NOTE - Follow Up Consult  Pharmacy Consult for Heparin Drip Indication: chest pain/ACS/STEMI  Allergies  Allergen Reactions  . Wound Dressing Adhesive Itching, Other (See Comments) and Rash    Other: "tears skin off" Other: "tears skin off" Other: "tears skin off"   . Latex Itching    Patient Measurements:   Heparin Dosing Weight: 71.1kg  Vital Signs: Temp: 98.2 F (36.8 C) (03/24 1948) Temp Source: Oral (03/24 1948) BP: 93/67 (03/24 1948) Pulse Rate: 77 (03/24 1948)  Labs: Recent Labs    12/31/20 0636 12/31/20 0834 12/31/20 0855 12/31/20 1200 12/31/20 1531 12/31/20 1731 12/31/20 2250  HGB 13.0  --   --   --   --   --   --   HCT 38.0  --   --   --   --   --   --   PLT 284  --   --   --   --   --   --   APTT  --   --  29  --   --   --   --   LABPROT  --   --  12.9  --   --   --   --   INR  --   --  1.0  --   --   --   --   HEPARINUNFRC  --   --   --   --  <0.10*  --  0.17*  CREATININE 0.77  --   --   --   --   --   --   TROPONINIHS 511*   < >  --  472* 557* 512*  --    < > = values in this interval not displayed.    Estimated Creatinine Clearance: 116.1 mL/min (by C-G formula based on SCr of 0.77 mg/dL).   Medications:  No PTA anticoagulation of record  Assessment: 25 yo obese female with recent treatment at [facility] for UTI without cardiac history presents with elevated troponin and chest pain.    Pharmacy has been consulted to initiate and monitor a heparin drip.  3/24 15:31 HL <0.10 3/24 2250 HL = 0.17   Goal of Therapy:  Heparin level 0.3-0.7 units/ml Monitor platelets by anticoagulation protocol: Yes   Plan:  HL is subtherapeutic, will order Heparin 1100 units IV bolus and increase drip rate to 1400 units/hr. Recheck HL 6 hours after rate change. Daily CBC while on Heparin drip.  Otelia Sergeant, PharmD, [facility] 01/01/2021 2:29 AM

## 2021-01-01 NOTE — Progress Notes (Signed)
PROGRESS NOTE    Sandra Gibbs  ZOX:[id number] DOB: [birth year] DOA: 12/31/2020 PCP: Cathie Hoopswens, Leanne Whaley, PA   Chief complaint.  Chest pain Brief Narrative:  Sandra Gibbs is a 25 y.o. female with medical history significant of DM, ADHD, kidney stone, IBS, pyelonephritis, who presents with chest pain.  Her peak troponin was 511. She is seen by cardiology, she heart heart cath performed today by Dr. Juliann Paresallwood, patient does not have any coronary artery disease.   Assessment & Plan:   Principal Problem:   Chest pain Active Problems:   Pyelonephritis   Diabetes mellitus without complication (HCC)   ADHD   Elevated troponin   Depression with anxiety  #1.  Non-ST elevation myocardial infarction secondary to Takotsubo. Takotsubo cardiomyopathy Acute systolic congestive heart failure secondary to Takotsubo cardiomyopathy. Patient is status post heart cath, no obstructive coronary disease was identified.  Patient is and a lot of stress recently, condition is more consistent with Takotsubo cardiomyopathy. Continue beta-blocker.  #2.  Acute pyelonephritis. Patient was recently diagnosed with acute pyelonephritis, was giving 1 dose of Rocephin.  Continue treatment.  No urine culture is pending.  3.  Type 2 diabetes. Continue current regimen.    DVT prophylaxis: Low risk for DVT Code Status: Full Family Communication:  Disposition Plan:  .   Status is: Inpatient  Remains inpatient appropriate because:Inpatient level of care appropriate due to severity of illness   Dispo: The patient is from: Home              Anticipated d/c is to: Home              Patient currently is not medically stable to d/c.   Difficult to place patient No        I/O last 3 completed shifts: In: -  Out: 250 [Urine:250] Total I/O In: 120 [P.O.:120] Out: -      Consultants:   Cardiology  Procedures: Heart cath  Antimicrobials: Rocephin  Subjective: Patient doing better today,  she just had a heart cath.  Currently she does not have any chest pain or shortness of breath.  She had some loose stools yesterday, no diarrhea today.  No nausea vomiting abdominal pain. Flank pain is better, no dysuria hematuria. No fever or chills. Objective: Vitals:   01/01/21 1000 01/01/21 1059 01/01/21 1148 01/01/21 1149  BP: (!) 103/52 99/60 (!) 99/53 (!) 100/58  Pulse: (!) 105 (!) 58 (!) 51   Resp: 14 18 18    Temp:  98.3 F (36.8 C) 98.3 F (36.8 C)   TempSrc:  Oral Oral   SpO2: (!) 80% 97% 99%   Weight:      Height:        Intake/Output Summary (Last 24 hours) at 01/01/2021 1246 Last data filed at 01/01/2021 1149 Gross per 24 hour  Intake 120 ml  Output 250 ml  Net -130 ml   Filed Weights   01/01/21 0714  Weight: 104.3 kg    Examination:  General exam: Appears calm and comfortable  Respiratory system: Clear to auscultation. Respiratory effort normal. Cardiovascular system: S1 & S2 heard, RRR. No JVD, murmurs, rubs, gallops or clicks. No pedal edema. Gastrointestinal system: Abdomen is nondistended, soft and nontender. No organomegaly or masses felt. Normal bowel sounds heard. Central nervous system: Alert and oriented. No focal neurological deficits. Extremities: Symmetric 5 x 5 power. Skin: No rashes, lesions or ulcers Psychiatry: Judgement and insight appear normal. Mood & affect appropriate.     Data  Reviewed: I have personally reviewed following labs and imaging studies  CBC: Recent Labs  Lab 12/31/20 0636 01/01/21 0423  WBC 8.4 6.6  NEUTROABS 4.2  --   HGB 13.0 13.0  HCT 38.0 38.8  MCV 85.6 87.6  PLT 284 293   Basic Metabolic Panel: Recent Labs  Lab 12/31/20 0636 01/01/21 0423  NA 137 142  K 3.9 4.1  CL 106 109  CO2 23 26  GLUCOSE 119* 97  BUN 8 10  CREATININE 0.77 0.71  CALCIUM 8.4* 8.8*   GFR: Estimated Creatinine Clearance: 116.1 mL/min (by C-G formula based on SCr of 0.71 mg/dL). Liver Function Tests: Recent Labs  Lab  12/31/20 0636  AST 20  ALT 25  ALKPHOS 71  BILITOT 0.7  PROT 6.6  ALBUMIN 3.3*   Recent Labs  Lab 12/31/20 0636  LIPASE 33   No results for input(s): AMMONIA in the last 168 hours. Coagulation Profile: Recent Labs  Lab 12/31/20 0855  INR 1.0   Cardiac Enzymes: No results for input(s): CKTOTAL, CKMB, CKMBINDEX, TROPONINI in the last 168 hours. BNP (last 3 results) No results for input(s): PROBNP in the last 8760 hours. HbA1C: Recent Labs    12/31/20 0911  HGBA1C 5.4   CBG: Recent Labs  Lab 12/31/20 1121 12/31/20 1640 12/31/20 2056 01/01/21 1148  GLUCAP 99 128* 126* 102*   Lipid Profile: Recent Labs    01/01/21 0423  CHOL 187  HDL 35*  LDLCALC 108*  TRIG 219*  CHOLHDL 5.3   Thyroid Function Tests: No results for input(s): TSH, T4TOTAL, FREET4, T3FREE, THYROIDAB in the last 72 hours. Anemia Panel: No results for input(s): VITAMINB12, FOLATE, FERRITIN, TIBC, IRON, RETICCTPCT in the last 72 hours. Sepsis Labs: No results for input(s): PROCALCITON, LATICACIDVEN in the last 168 hours.  Recent Results (from the past 240 hour(s))  SARS CORONAVIRUS 2 (TAT 6-24 HRS) Nasopharyngeal Nasopharyngeal Swab     Status: None   Collection Time: 12/31/20  8:55 AM   Specimen: Nasopharyngeal Swab  Result Value Ref Range Status   SARS Coronavirus 2 NEGATIVE NEGATIVE Final    Comment: (NOTE) SARS-CoV-2 target nucleic acids are NOT DETECTED.  The SARS-CoV-2 RNA is generally detectable in upper and lower respiratory specimens during the acute phase of infection. Negative results do not preclude SARS-CoV-2 infection, do not rule out co-infections with other pathogens, and should not be used as the sole basis for treatment or other patient management decisions. Negative results must be combined with clinical observations, patient history, and epidemiological information. The expected result is Negative.  Fact Sheet for  Patients: HairSlick.no  Fact Sheet for Healthcare Providers: quierodirigir.com  This test is not yet approved or cleared by the [place] FDA and  has been authorized for detection and/or diagnosis of SARS-CoV-2 by FDA under an Emergency Use Authorization (EUA). This EUA will remain  in effect (meaning this test can be used) for the duration of the COVID-19 declaration under Se ction 564(b)(1) of the Act, 21 U.S.C. section 360bbb-3(b)(1), unless the authorization is terminated or revoked sooner.  Performed at [facility], [address], [place], [place] [address]   Culture, blood (Routine X 2) w Reflex to ID Panel     Status: None (Preliminary result)   Collection Time: 12/31/20  9:40 AM   Specimen: BLOOD  Result Value Ref Range Status   Specimen Description BLOOD RIGHT [facility]  Final   Special Requests   Final    BOTTLES DRAWN AEROBIC AND  ANAEROBIC Blood Culture results may not be optimal due to an inadequate volume of blood received in culture bottles   Culture   Final    NO GROWTH < 24 HOURS Performed at [facility], [address] [place], [place] [address]    Report Status PENDING  Incomplete  Culture, blood (Routine X 2) w Reflex to ID Panel     Status: None (Preliminary result)   Collection Time: 12/31/20  9:40 AM   Specimen: BLOOD  Result Value Ref Range Status   Specimen Description BLOOD LEFT AC  Final   Special Requests   Final    BOTTLES DRAWN AEROBIC AND ANAEROBIC Blood Culture results may not be optimal due to an inadequate volume of blood received in culture bottles   Culture   Final    NO GROWTH < 24 HOURS Performed at [facility], [address], [place], [place] [address]    Report Status PENDING  Incomplete         Radiology Studies: CT Angio Chest PE W and/or Wo Contrast  Result Date: 12/31/2020 CLINICAL DATA:  Chest pain EXAM: CT ANGIOGRAPHY CHEST WITH CONTRAST  TECHNIQUE: Multidetector CT imaging of the chest was performed using the standard protocol during bolus administration of intravenous contrast. Multiplanar CT image reconstructions and MIPs were obtained to evaluate the vascular anatomy. CONTRAST:  69mL OMNIPAQUE IOHEXOL 350 MG/ML SOLN COMPARISON:  Chest x-ray from earlier in the same day. FINDINGS: Cardiovascular: Thoracic aorta shows no aneurysmal dilatation or dissection. Heart is mildly enlarged in size. No pericardial effusion is seen. No significant coronary calcifications are noted. Pulmonary artery shows a normal branching pattern bilaterally. No intraluminal filling defect is identified to suggest pulmonary embolism. Mediastinum/Nodes: Thoracic inlet is within normal limits. No sizable hilar or mediastinal adenopathy is noted esophagus as visualized is within normal limits. Lungs/Pleura: Lungs are well aerated bilaterally. No focal infiltrate or sizable effusion is seen. No bony abnormality is noted. Upper Abdomen: Visualized upper abdomen is unremarkable. Musculoskeletal: No chest wall abnormality. No acute or significant osseous findings. Review of the MIP images confirms the above findings. IMPRESSION: No evidence of pulmonary emboli. No acute abnormality noted. Electronically Signed   By: Alcide Clever M.D.   On: 12/31/2020 08:07   CARDIAC CATHETERIZATION  Result Date: 01/01/2021  Normal coronaries  Low normal left ventricular function 50 to 55%  Conclusion Normal coronaries Preserved left ventricular function between 50 and 55% Elevated troponins are probably related to possible myocarditis Recommend medical therapy   DG Chest Portable 1 View  Result Date: 12/31/2020 CLINICAL DATA:  Chest pain EXAM: PORTABLE CHEST 1 VIEW COMPARISON:  06/24/2016 abdominal CT FINDINGS: Prominent heart size accentuated by low volumes. There is no edema, consolidation, effusion, or pneumothorax. Artifact from EKG leads IMPRESSION: 1. Prominent heart size that is  accentuated by low volumes. 2. Clear lungs. Electronically Signed   By: Marnee Spring M.D.   On: 12/31/2020 07:40   ECHOCARDIOGRAM COMPLETE  Result Date: 12/31/2020    ECHOCARDIOGRAM REPORT   Patient Name:   Sandra Gibbs Date of Exam: 12/31/2020 Medical Rec #:  [id number]        Height:       60.0 in Accession #:    [id number]       Weight:       230.0 lb Date of Birth:  Feb 13, 1996        BSA:          1.981 m Patient Age:  24 years         BP:           110/73 mmHg Patient Gender: F                HR:           65 bpm. Exam Location:  [facility] Procedure: 2D Echo, Cardiac Doppler and Color Doppler Indications:     Elevated troponin  History:         Patient has no prior history of Echocardiogram examinations.                  Risk Factors:Diabetes. ADHD.  Sonographer:     Cristela Blue RDCS (AE) Referring Phys:  [id number] [facility] WH[facility] Diagnosing Phys: Alwyn Pea MD  Sonographer Comments: Suboptimal apical window and no subcostal window. IMPRESSIONS  1. Inferior Hypo.  2. Left ventricular ejection fraction, by estimation, is 45 to 50%. The left ventricle has mildly decreased function. The left ventricle demonstrates regional wall motion abnormalities (see scoring diagram/findings for description). The left ventricular  internal cavity size was mildly dilated. Left ventricular diastolic parameters were normal.  3. Right ventricular systolic function is low normal. The right ventricular size is normal.  4. The mitral valve is normal in structure. No evidence of mitral valve regurgitation.  5. The aortic valve is grossly normal. Aortic valve regurgitation is not visualized. FINDINGS  Left Ventricle: Left ventricular ejection fraction, by estimation, is 45 to 50%. The left ventricle has mildly decreased function. The left ventricle demonstrates regional wall motion abnormalities. The left ventricular internal cavity size was mildly dilated. There is no left ventricular hypertrophy. Left ventricular diastolic  parameters were normal. Right Ventricle: The right ventricular size is normal. No increase in right ventricular wall thickness. Right ventricular systolic function is low normal. Left Atrium: Left atrial size was normal in size. Right Atrium: Right atrial size was normal in size. Pericardium: The pericardium was not well visualized. Mitral Valve: The mitral valve is normal in structure. No evidence of mitral valve regurgitation. Tricuspid Valve: The tricuspid valve is grossly normal. Tricuspid valve regurgitation is trivial. Aortic Valve: The aortic valve is grossly normal. Aortic valve regurgitation is not visualized. Aortic valve mean gradient measures 3.0 mmHg. Aortic valve peak gradient measures 5.4 mmHg. Aortic valve area, by VTI measures 2.18 cm. Pulmonic Valve: The pulmonic valve was normal in structure. Pulmonic valve regurgitation is not visualized. Aorta: The aortic arch was not well visualized. IAS/Shunts: No atrial level shunt detected by color flow Doppler. Additional Comments: Inferior Hypo.  LEFT VENTRICLE PLAX 2D LVIDd:         4.59 cm  Diastology LVIDs:         3.06 cm  LV e' medial:    12.60 cm/s LV PW:         1.16 cm  LV E/e' medial:  7.4 LV IVS:        0.91 cm  LV e' lateral:   13.80 cm/s LVOT diam:     2.00 cm  LV E/e' lateral: 6.8 LV SV:         58 LV SV Index:   29 LVOT Area:     3.14 cm  RIGHT VENTRICLE RV Basal diam:  2.71 cm RV S prime:     9.90 cm/s TAPSE (M-mode): 3.6 cm LEFT ATRIUM             Index       RIGHT ATRIUM  Index LA diam:        3.20 cm 1.62 cm/m  RA Area:     20.10 cm LA Vol (A2C):   53.6 ml 27.06 ml/m RA Volume:   58.40 ml  29.48 ml/m LA Vol (A4C):   39.3 ml 19.84 ml/m LA Biplane Vol: 45.9 ml 23.17 ml/m  AORTIC VALVE                   PULMONIC VALVE AV Area (Vmax):    1.84 cm    PV Vmax:        0.57 m/s AV Area (Vmean):   2.14 cm    PV Peak grad:   1.3 mmHg AV Area (VTI):     2.18 cm    RVOT Peak grad: 1 mmHg AV Vmax:           116.00 cm/s AV Vmean:           78.800 cm/s AV VTI:            0.268 m AV Peak Grad:      5.4 mmHg AV Mean Grad:      3.0 mmHg LVOT Vmax:         67.90 cm/s LVOT Vmean:        53.600 cm/s LVOT VTI:          0.186 m LVOT/AV VTI ratio: 0.69  AORTA Ao Root diam: 2.60 cm MITRAL VALVE               TRICUSPID VALVE MV Area (PHT): 3.56 cm    TR Peak grad:   10.1 mmHg MV Decel Time: 213 msec    TR Vmax:        159.00 cm/s MV E velocity: 93.80 cm/s MV A velocity: 41.10 cm/s  SHUNTS MV E/A ratio:  2.28        Systemic VTI:  0.19 m                            Systemic Diam: 2.00 cm Dwayne Salome Arnt MD Electronically signed by Alwyn Pea MD Signature Date/Time: 12/31/2020/1:18:46 PM    Final         Scheduled Meds: . ARIPiprazole  2 mg Oral QHS  . aspirin EC  81 mg Oral Daily  . atorvastatin  40 mg Oral Daily  . busPIRone  30 mg Oral BID  . FLUoxetine  60 mg Oral q morning  . insulin aspart  0-5 Units Subcutaneous QHS  . insulin aspart  0-9 Units Subcutaneous TID WC  . methylphenidate  27 mg Oral QHS  . oxybutynin  10 mg Oral Daily  . propranolol  20 mg Oral BID  . sodium chloride flush  3 mL Intravenous Q12H  . sodium chloride flush  3 mL Intravenous Q12H   Continuous Infusions: . sodium chloride    . sodium chloride    . cefTRIAXone (ROCEPHIN)  IV Stopped (12/31/20 1054)     LOS: 1 day    Time spent: 28 minutes    Marrion Coy, MD [facility]   To contact the attending provider between 7A-7P or the covering provider during after hours 7P-7A, please log into the web site [email or web address] and access using universal  password for that web site. If you do not have the password, please call the hospital operator.  01/01/2021, 12:46 PM

## 2021-01-02 DIAGNOSIS — R079 Chest pain, unspecified: Secondary | ICD-10-CM

## 2021-01-02 DIAGNOSIS — I5181 Takotsubo syndrome: Secondary | ICD-10-CM | POA: Diagnosis not present

## 2021-01-02 DIAGNOSIS — I5021 Acute systolic (congestive) heart failure: Secondary | ICD-10-CM | POA: Diagnosis not present

## 2021-01-02 DIAGNOSIS — N12 Tubulo-interstitial nephritis, not specified as acute or chronic: Secondary | ICD-10-CM | POA: Diagnosis not present

## 2021-01-02 LAB — BASIC METABOLIC PANEL
Anion gap: 7 (ref 5–15)
BUN: 12 mg/dL (ref 6–20)
CO2: 28 mmol/L (ref 22–32)
Calcium: 8.9 mg/dL (ref 8.9–10.3)
Chloride: 105 mmol/L (ref 98–111)
Creatinine, Ser: 0.74 mg/dL (ref 0.44–1.00)
GFR, Estimated: >60 mL/min (ref >60)
Glucose, Bld: 112 mg/dL — ABNORMAL HIGH (ref 70–99)
Potassium: 3.8 mmol/L (ref 3.5–5.1)
Sodium: 140 mmol/L (ref 135–145)

## 2021-01-02 LAB — GLUCOSE, CAPILLARY: Glucose-Capillary: 100 mg/dL — ABNORMAL HIGH (ref 70–99)

## 2021-01-02 LAB — CBC WITH DIFFERENTIAL/PLATELET
Abs Immature Granulocytes: 0.04 10*3/uL (ref 0.00–0.07)
Basophils Absolute: 0 10*3/uL (ref 0.0–0.1)
Basophils Relative: 0 %
Eosinophils Absolute: 0.1 10*3/uL (ref 0.0–0.5)
Eosinophils Relative: 1 %
HCT: 38 % (ref 36.0–46.0)
Hemoglobin: 12.4 g/dL (ref 12.0–15.0)
Immature Granulocytes: 1 %
Lymphocytes Relative: 40 %
Lymphs Abs: 3.1 10*3/uL (ref 0.7–4.0)
MCH: 28.7 pg (ref 26.0–34.0)
MCHC: 32.6 g/dL (ref 30.0–36.0)
MCV: 88 fL (ref 80.0–100.0)
Monocytes Absolute: 0.8 10*3/uL (ref 0.1–1.0)
Monocytes Relative: 10 %
Neutro Abs: 3.7 10*3/uL (ref 1.7–7.7)
Neutrophils Relative %: 48 %
Platelets: 329 10*3/uL (ref 150–400)
RBC: 4.32 MIL/uL (ref 3.87–5.11)
RDW: 12.6 % (ref 11.5–15.5)
WBC: 7.7 10*3/uL (ref 4.0–10.5)
nRBC: 0 % (ref 0.0–0.2)

## 2021-01-02 LAB — MAGNESIUM: Magnesium: 2 mg/dL (ref 1.7–2.4)

## 2021-01-02 MED ORDER — ASPIRIN 81 MG PO TBEC: 81.0000 mg | DELAYED_RELEASE_TABLET | Freq: Every day | ORAL | 0 refills | Status: DC

## 2021-01-02 MED ORDER — ATORVASTATIN CALCIUM 40 MG PO TABS: 40.0000 mg | ORAL_TABLET | Freq: Every day | ORAL | 0 refills | Status: DC

## 2021-01-02 MED ORDER — CEPHALEXIN 500 MG PO CAPS: 500.0000 mg | ORAL_CAPSULE | Freq: Four times a day (QID) | ORAL | 0 refills | Status: AC

## 2021-01-02 NOTE — Discharge Summary (Signed)
Physician Discharge Summary  Patient ID: Sandra Gibbs MRN: [id number] DOB/AGE: 11/09/1995 24 y.o.  Admit date: 12/31/2020 Discharge date: 01/02/2021  Admission Diagnoses:  Discharge Diagnoses:  Principal Problem:   Chest pain Active Problems:   Pyelonephritis   Diabetes mellitus without complication (HCC)   ADHD   Elevated troponin   Depression with anxiety   Takotsubo cardiomyopathy   Acute systolic CHF (congestive heart failure) [facility])   Discharged Condition: good  Hospital Course:  Sandra Gutzmer Hinkleis a 25 y.o.femalewith medical history significant ofDM, ADHD, kidney stone, IBS,pyelonephritis,who presents with chest pain.  Her peak troponin was 511. She is seen by cardiology, she heart heart cath performed today by Dr. Juliann Pares, patient does not have any coronary artery disease.  #1.  Non-ST elevation myocardial infarction secondary to Takotsubo. Takotsubo cardiomyopathy Acute systolic congestive heart failure secondary to Takotsubo cardiomyopathy. Patient is status post heart cath, no obstructive coronary disease was identified.  Patient is and a lot of stress recently, condition is more consistent with Takotsubo cardiomyopathy. Continue beta-blocker. Followed by PCP, potentially refer to psychiatry for severe stress in life.  #2.  Acute pyelonephritis. Patient was recently diagnosed with acute pyelonephritis, was giving 1 dose of Rocephin as outpatient.  She was continued on Rocephin. No urine culture was sent out, will continue Keflex for 5 days.  Symptomatically improved.  3.  Type 2 diabetes. Stable.    Consults: pulmonary/intensive care  Significant Diagnostic Studies:  Heart cath:  Normal coronaries  Low normal left ventricular function 50 to 55%   Conclusion Normal coronaries Preserved left ventricular function between 50 and 55% Elevated troponins are probably related to possible myocarditis Recommend medical therapy  Echo: 1. Inferior  Hypo. 2. Left ventricular ejection fraction, by estimation, is 45 to 50%. The left ventricle has mildly decreased function. The left ventricle demonstrates regional wall motion abnormalities (see scoring diagram/findings for description). The left ventricular internal cavity size was mildly dilated. Left ventricular diastolic parameters were normal. 3. Right ventricular systolic function is low normal. The right ventricular size is normal. 4. The mitral valve is normal in structure. No evidence of mitral valve regurgitation. 5. The aortic valve is grossly normal. Aortic valve regurgitation is not visualized.   Treatments:  Heart cath  Discharge Exam: Blood pressure 106/70, pulse (!) 55, temperature 97.7 F (36.5 C), temperature source Oral, resp. rate 18, height 5' (1.524 m), weight 104.3 kg, SpO2 97 %. General appearance: alert and cooperative Resp: clear to auscultation bilaterally Cardio: regular rate and rhythm, S1, S2 normal, no murmur, click, rub or gallop GI: soft, non-tender; bowel sounds normal; no masses,  no organomegaly Extremities: extremities normal, atraumatic, no cyanosis or edema  Disposition: Discharge disposition: 01-Home or Self Care       Discharge Instructions    Diet - low sodium heart healthy   Complete by: As directed    Increase activity slowly   Complete by: As directed      Allergies as of 01/02/2021      Reactions   Wound Dressing Adhesive Itching, Other (See Comments), Rash   Other: "tears skin off" Other: "tears skin off" Other: "tears skin off"   Latex Itching      Medication List    TAKE these medications   ARIPiprazole 2 MG tablet Commonly known as: ABILIFY Take 2 mg by mouth at bedtime.   aspirin 81 MG EC tablet Take 1 tablet (81 mg total) by mouth daily. Swallow whole.   atorvastatin 40 MG tablet Commonly  known as: LIPITOR Take 1 tablet (40 mg total) by mouth daily.   busPIRone 30 MG tablet Commonly known as: BUSPAR Take  30 mg by mouth 2 (two) times daily.   cephALEXin 500 MG capsule Commonly known as: KEFLEX Take 1 capsule (500 mg total) by mouth 4 (four) times daily for 5 days.   Concerta 27 MG CR tablet Generic drug: methylphenidate Take 27 mg by mouth at bedtime.   FLUoxetine 20 MG capsule Commonly known as: PROZAC Take 60 mg by mouth every morning.   hyoscyamine 0.125 MG SL tablet Commonly known as: LEVSIN SL Take 0.125 mg by mouth every 8 (eight) hours as needed.   levonorgestrel 20 MCG/24HR IUD Commonly known as: MIRENA 1 each by Intrauterine route once.   ondansetron 4 MG disintegrating tablet Commonly known as: ZOFRAN-ODT Take 4 mg by mouth every 8 (eight) hours as needed for nausea.   oxybutynin 10 MG 24 hr tablet Commonly known as: DITROPAN-XL Take 10 mg by mouth daily.   propranolol 20 MG tablet Commonly known as: INDERAL TAKE 1 TABLET(20 MG) BY MOUTH TWICE DAILY       Follow-up Information    Cathie Hoops, PA Follow up in 1 week(s).   Specialty: Family Medicine Contact information: [address] [place] [address] [phone]        Alwyn Pea, MD Follow up in 2 week(s).   Specialties: Cardiology, Internal Medicine Contact information: [address] [place] [address] [phone]               Signed: Marrion Coy 01/02/2021, 9:13 AM

## 2021-01-05 LAB — CULTURE, BLOOD (ROUTINE X 2)
Culture: NO GROWTH
Culture: NO GROWTH

## 2021-01-13 DIAGNOSIS — Z111 Encounter for screening for respiratory tuberculosis: Secondary | ICD-10-CM | POA: Diagnosis not present

## 2021-01-13 DIAGNOSIS — Z23 Encounter for immunization: Secondary | ICD-10-CM | POA: Insufficient documentation

## 2021-01-14 ENCOUNTER — Other Ambulatory Visit
Admission: RE | Admit: 2021-01-14 | Discharge: 2021-01-14 | Disposition: A | Discharge status: Home / Self Care | Payer: Medicaid Other | Source: Ambulatory Visit | Attending: Internal Medicine | Admitting: Internal Medicine

## 2021-01-14 ENCOUNTER — Telehealth: Payer: Self-pay | Admitting: Internal Medicine

## 2021-01-14 ENCOUNTER — Other Ambulatory Visit: Payer: Self-pay

## 2021-01-14 ENCOUNTER — Encounter: Payer: Self-pay | Admitting: Internal Medicine

## 2021-01-14 ENCOUNTER — Ambulatory Visit (INDEPENDENT_AMBULATORY_CARE_PROVIDER_SITE_OTHER): Payer: Medicaid Other | Admitting: Internal Medicine

## 2021-01-14 VITALS — BP 102/76 | HR 88 | Ht 60.0 in | Wt 231.0 lb

## 2021-01-14 DIAGNOSIS — I5181 Takotsubo syndrome: Secondary | ICD-10-CM | POA: Diagnosis not present

## 2021-01-14 DIAGNOSIS — I214 Non-ST elevation (NSTEMI) myocardial infarction: Secondary | ICD-10-CM | POA: Diagnosis not present

## 2021-01-14 LAB — TROPONIN I (HIGH SENSITIVITY): Troponin I (High Sensitivity): 2 ng/L (ref <18)

## 2021-01-14 MED ORDER — DIAZEPAM 5 MG PO TABS: ORAL_TABLET | ORAL | 0 refills | Status: DC

## 2021-01-14 NOTE — Telephone Encounter (Signed)
  Patient Consent for Virtual Visit         Sandra Gibbs has provided verbal consent on 01/14/2021 for a virtual visit (video or telephone).   CONSENT FOR VIRTUAL VISIT FOR:  Sandra Gibbs  By participating in this virtual visit I agree to the following:  I hereby voluntarily request, consent and authorize [facility] and its employed or contracted physicians, physician assistants, nurse practitioners or other licensed health care professionals (the Practitioner), to provide me with telemedicine health care services (the "Services") as deemed necessary by the treating Practitioner. I acknowledge and consent to receive the Services by the Practitioner via telemedicine. I understand that the telemedicine visit will involve communicating with the Practitioner through live audiovisual communication technology and the disclosure of certain medical information by electronic transmission. I acknowledge that I have been given the opportunity to request an in-person assessment or other available alternative prior to the telemedicine visit and am voluntarily participating in the telemedicine visit.  I understand that I have the right to withhold or withdraw my consent to the use of telemedicine in the course of my care at any time, without affecting my right to future care or treatment, and that the Practitioner or I may terminate the telemedicine visit at any time. I understand that I have the right to inspect all information obtained and/or recorded in the course of the telemedicine visit and may receive copies of available information for a reasonable fee.  I understand that some of the potential risks of receiving the Services via telemedicine include:  Marland Kitchen Delay or interruption in medical evaluation due to technological equipment failure or disruption; . Information transmitted may not be sufficient (e.g. poor resolution of images) to allow for appropriate medical decision making by the  Practitioner; and/or  . In rare instances, security protocols could fail, causing a breach of personal health information.  Furthermore, I acknowledge that it is my responsibility to provide information about my medical history, conditions and care that is complete and accurate to the best of my ability. I acknowledge that Practitioner's advice, recommendations, and/or decision may be based on factors not within their control, such as incomplete or inaccurate data provided by me or distortions of diagnostic images or specimens that may result from electronic transmissions. I understand that the practice of medicine is not an exact science and that Practitioner makes no warranties or guarantees regarding treatment outcomes. I acknowledge that a copy of this consent can be made available to me via my patient portal [facility] MyChart), or I can request a printed copy by calling the office of [facility].    I understand that my insurance will be billed for this visit.   I have read or had this consent read to me. . I understand the contents of this consent, which adequately explains the benefits and risks of the Services being provided via telemedicine.  . I have been provided ample opportunity to ask questions regarding this consent and the Services and have had my questions answered to my satisfaction. . I give my informed consent for the services to be provided through the use of telemedicine in my medical care

## 2021-01-14 NOTE — Patient Instructions (Addendum)
Medication Instructions:  - Your physician recommends that you continue on your current medications as directed. Please refer to the Current Medication list given to you today.   *If you need a refill on your cardiac medications before your next appointment, please call your pharmacy*   Lab Work: - Your physician recommends that you have lab work today: Troponin  Sales executive at [facility] 1st desk on the right to check in, past the screening table Lab hours: Monday- Friday (7:30 am- 5:30 pm)  If you have labs (blood work) drawn today and your tests are completely normal, you will receive your results only by: Marland Kitchen MyChart Message (if you have MyChart) OR . A paper copy in the mail If you have any lab test that is abnormal or we need to change your treatment, we will call you to review the results.   Testing/Procedures:  1) Cardiac MRI:  - You are scheduled for Cardiac MRI on ________________. - Please arrive at the [facility] main entrance of [facility] at __________________ (30-45 minutes prior to test start time). ?  - Take Valium 5 mg x 1 dose on arrival for the MRI scan  - someone should drive you home due to the medication   [facility]  [address]  [place], [place] [address]  [phone]  Proceed to the [facility] Radiology Department (First Floor).  ?  Magnetic resonance imaging (MRI) is a painless test that produces images of the inside of the body without using X-rays. During an MRI, strong magnets and radio waves work together in a Data processing manager to form detailed images. MRI images may provide more details about a medical condition than X-rays, CT scans, and ultrasounds can provide.  - You may be given earphones to listen for instructions.  - You may eat a light breakfast and take medications as ordered  - If a contrast material will be used, an IV will be inserted into one of your veins. Contrast material will be injected into your IV.   - You will be asked to remove all metal, including: Watch, jewelry, and other metal objects including hearing aids, hair pieces and dentures. (Braces and fillings normally are not a problem.)  - If contrast material was used:  It will leave your body through your urine within a day. You may be told to drink plenty of fluids to help flush the contrast material out of your system.  TEST WILL TAKE APPROXIMATELY 1 HOUR  PLEASE NOTIFY SCHEDULING AT LEAST 24 HOURS IN ADVANCE IF YOU ARE UNABLE TO KEEP YOUR APPOINTMENT.      Follow-Up: At [facility], you and your health needs are our priority.  As part of our continuing mission to provide you with exceptional heart care, we have created designated Provider Care Teams.  These Care Teams include your primary Cardiologist (physician) and Advanced Practice Providers (APPs -  Physician Assistants and Nurse Practitioners) who all work together to provide you with the care you need, when you need it.  We recommend signing up for the patient portal called "MyChart".  Sign up information is provided on this After Visit Summary.  MyChart is used to connect with patients for Virtual Visits (Telemedicine).  Patients are able to view lab/test results, encounter notes, upcoming appointments, etc.  Non-urgent messages can be sent to your provider as well.   To learn more about what you can do with MyChart, go to [email or web address].    Your next  appointment:   1 month(s)  The format for your next appointment:   Virtual Visit   Provider:   Sherryl Manges, MD   Other Instructions  n/a

## 2021-01-14 NOTE — Progress Notes (Signed)
Patient Care Team: Cathie Hoops, PA as PCP - General (Family Medicine)   HPI  DAILY DOE is a 25 y.o. female seen in follow-up for a recent hospitalization for troponin positive mild left ventricular dysfunction associated with chest pain setting of significant stress thought to be possibly Tako-Tsubo; the issue of myocarditis was also raised.  Echocardiogram demonstrated mild LV dysfunction catheterization demonstrated near normalization.  She was discharged on low-dose beta-blockers (from previous) no further afterload.  She was seen in consultation at the hospital by Dr. Cheryln Manly; no comments were made by him on the cosigned notes.  DATE TEST EF   3/22 Echo   45-50 % WMA -specifics not outlined  3/22 LHC   50-55 % Low normal No comment on WMA        Continues to live with significant psychosocial stress.  She see psychiatry but no cognitive therapy    Records and Results Reviewed   Past Medical History:  Diagnosis Date  . ADHD   . Diabetes mellitus without complication [facility]     Past Surgical History:  Procedure Laterality Date  . CYST REMOVAL TRUNK  2019  . GALLBLADDER SURGERY  2016  . KIDNEY SURGERY    . LEFT HEART CATH AND CORONARY ANGIOGRAPHY N/A 01/01/2021   Procedure: LEFT HEART CATH AND CORONARY ANGIOGRAPHY;  Surgeon: Alwyn Pea, MD;  Location: [facility] INVASIVE CV LAB;  Service: Cardiovascular;  Laterality: N/A;  . OVARIAN CYST SURGERY  2011  . TONSILLECTOMY      Current Meds  Medication Sig  . ARIPiprazole (ABILIFY) 2 MG tablet Take 2 mg by mouth at bedtime.  Marland Kitchen aspirin EC 81 MG EC tablet Take 1 tablet (81 mg total) by mouth daily. Swallow whole.  Marland Kitchen atorvastatin (LIPITOR) 40 MG tablet Take 1 tablet (40 mg total) by mouth daily.  . busPIRone (BUSPAR) 30 MG tablet Take 30 mg by mouth 2 (two) times daily.  . CONCERTA 27 MG CR tablet Take 27 mg by mouth at bedtime.  Marland Kitchen FLUoxetine (PROZAC) 20 MG capsule Take 60 mg by mouth every morning.  .  hyoscyamine (LEVSIN SL) 0.125 MG SL tablet Take 0.125 mg by mouth every 8 (eight) hours as needed.  Marland Kitchen levonorgestrel (MIRENA) 20 MCG/24HR IUD 1 each by Intrauterine route once.  Marland Kitchen oxybutynin (DITROPAN-XL) 10 MG 24 hr tablet Take 10 mg by mouth daily.  . propranolol (INDERAL) 20 MG tablet TAKE 1 TABLET(20 MG) BY MOUTH TWICE DAILY    Allergies  Allergen Reactions  . Wound Dressing Adhesive Itching, Other (See Comments) and Rash    Other: "tears skin off" Other: "tears skin off" Other: "tears skin off"   . Latex Itching      Review of Systems negative except from HPI and PMH  Physical Exam BP 102/76   Pulse 88   Ht 5' (1.524 m)   Wt 231 lb (104.8 kg)   BMI 45.11 kg/m  Well developed and Morbidly obese in no acute distress HENT normal E scleral and icterus clear Neck Supple JVP flat; carotids brisk and full Clear to ausculation  Regular rate and rhythm, no murmurs gallops or rub Soft with active bowel sounds No clubbing cyanosis  Edema Alert and oriented, grossly normal motor and sensory function Skin Warm and Dry  ECG sinus at 88.  Normal ECG Estimated Creatinine Clearance: 116.4 mL/min (by C-G formula based on SCr of 0.74 mg/dL).   Assessment and  Plan  Palpitations question mechanism  Obesity  Chest pain with positive troponin?  Myocarditis?  Tako-Tsubo  Depression psychosocial stress    The patient was recently hospitalized.  It is hard to know at this juncture what to do about medications now 2 weeks out if this were Tako-Tsubo; on up-to-date, recommendations for afterload are typically limited to the duration of LV dysfunction.  It was not significant.  So we will hold particularly given her low blood pressure and problems with recurrent dizziness although it is not positional.  We will obtain a cMRI to look at LV function and to see if there is any evidence in support of one diagnosis for the other.  Unfortunately imaging studies do not report wall  motion abnormalities in such a way as to inform my limited understanding as to Tako-Tsubo i.e. basilar sparing etc.    Current medicines are reviewed at length with the patient today .  The patient does not  have concerns regarding medicines.

## 2021-01-14 NOTE — Telephone Encounter (Signed)
Left message for patient to cal to discuss preferred scheduling weekdays and times for the Cardiac MRI ordered by Dr. Graciela Husbands.

## 2021-01-15 DIAGNOSIS — R109 Unspecified abdominal pain: Secondary | ICD-10-CM | POA: Diagnosis not present

## 2021-01-15 DIAGNOSIS — N200 Calculus of kidney: Secondary | ICD-10-CM | POA: Diagnosis not present

## 2021-01-18 NOTE — Telephone Encounter (Signed)
Charlyne Mom message for patient to call and discuss scheduling the Cardiac MRI ordered by Dr. Graciela Husbands

## 2021-01-20 NOTE — Telephone Encounter (Signed)
Left message for patient to call and discuss scheduling the Cardiac MRI ordered by Dr. Klein 

## 2021-01-21 ENCOUNTER — Encounter: Payer: Self-pay | Admitting: Internal Medicine

## 2021-01-21 NOTE — Telephone Encounter (Signed)
Left message for patient regarding scheduled 02/24/21 9:00 am Cardiac MRI appointment at [facility].  Arrival time is 8:30am--1st floor admissions office for check in.  Will mail information  to patient and it is available on My Chart.  Requested patient call with questions or concerns.

## 2021-01-29 IMAGING — CR DG ABDOMEN 1V
2 series · 2 of 2 positions shown · non-contrast
Comparison: CT [DATE]

CLINICAL DATA: Bilateral flank pain.  Rule out stone.

EXAM:
ABDOMEN - 1 VIEW

[abdomen kub (1 of 2)]
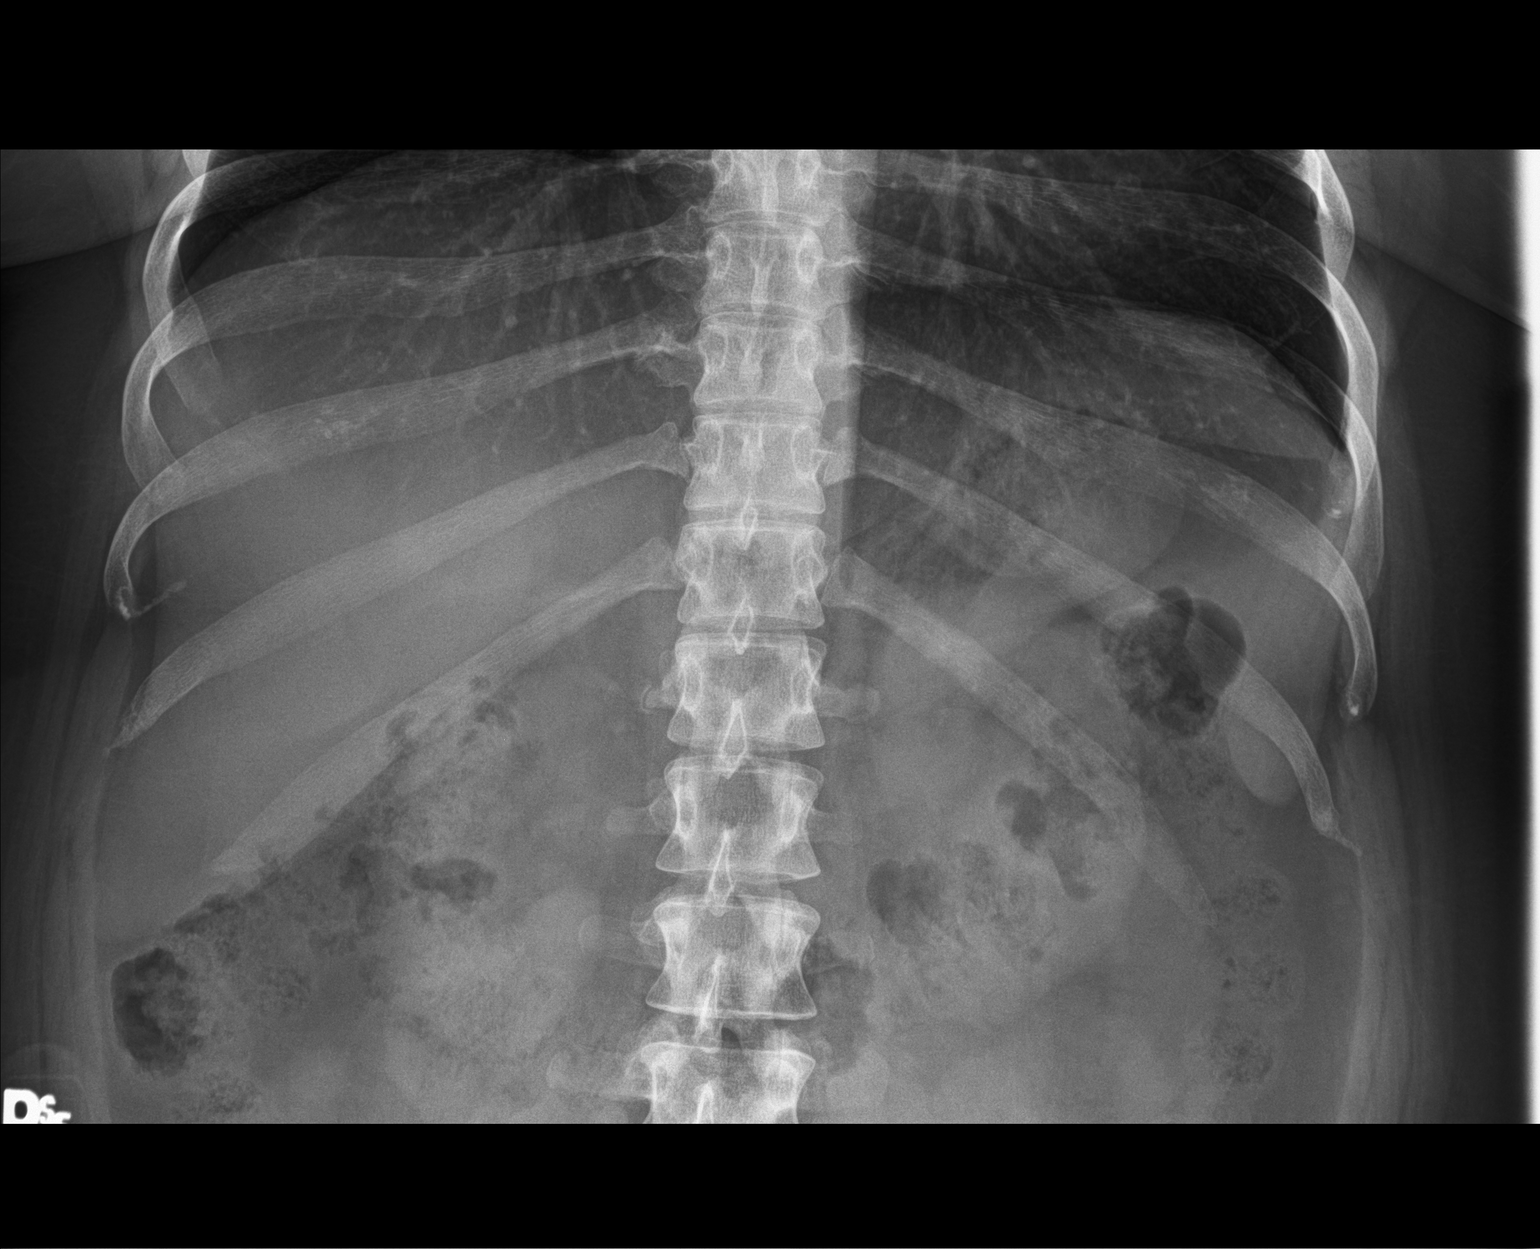

[abdomen kub (2 of 2)]
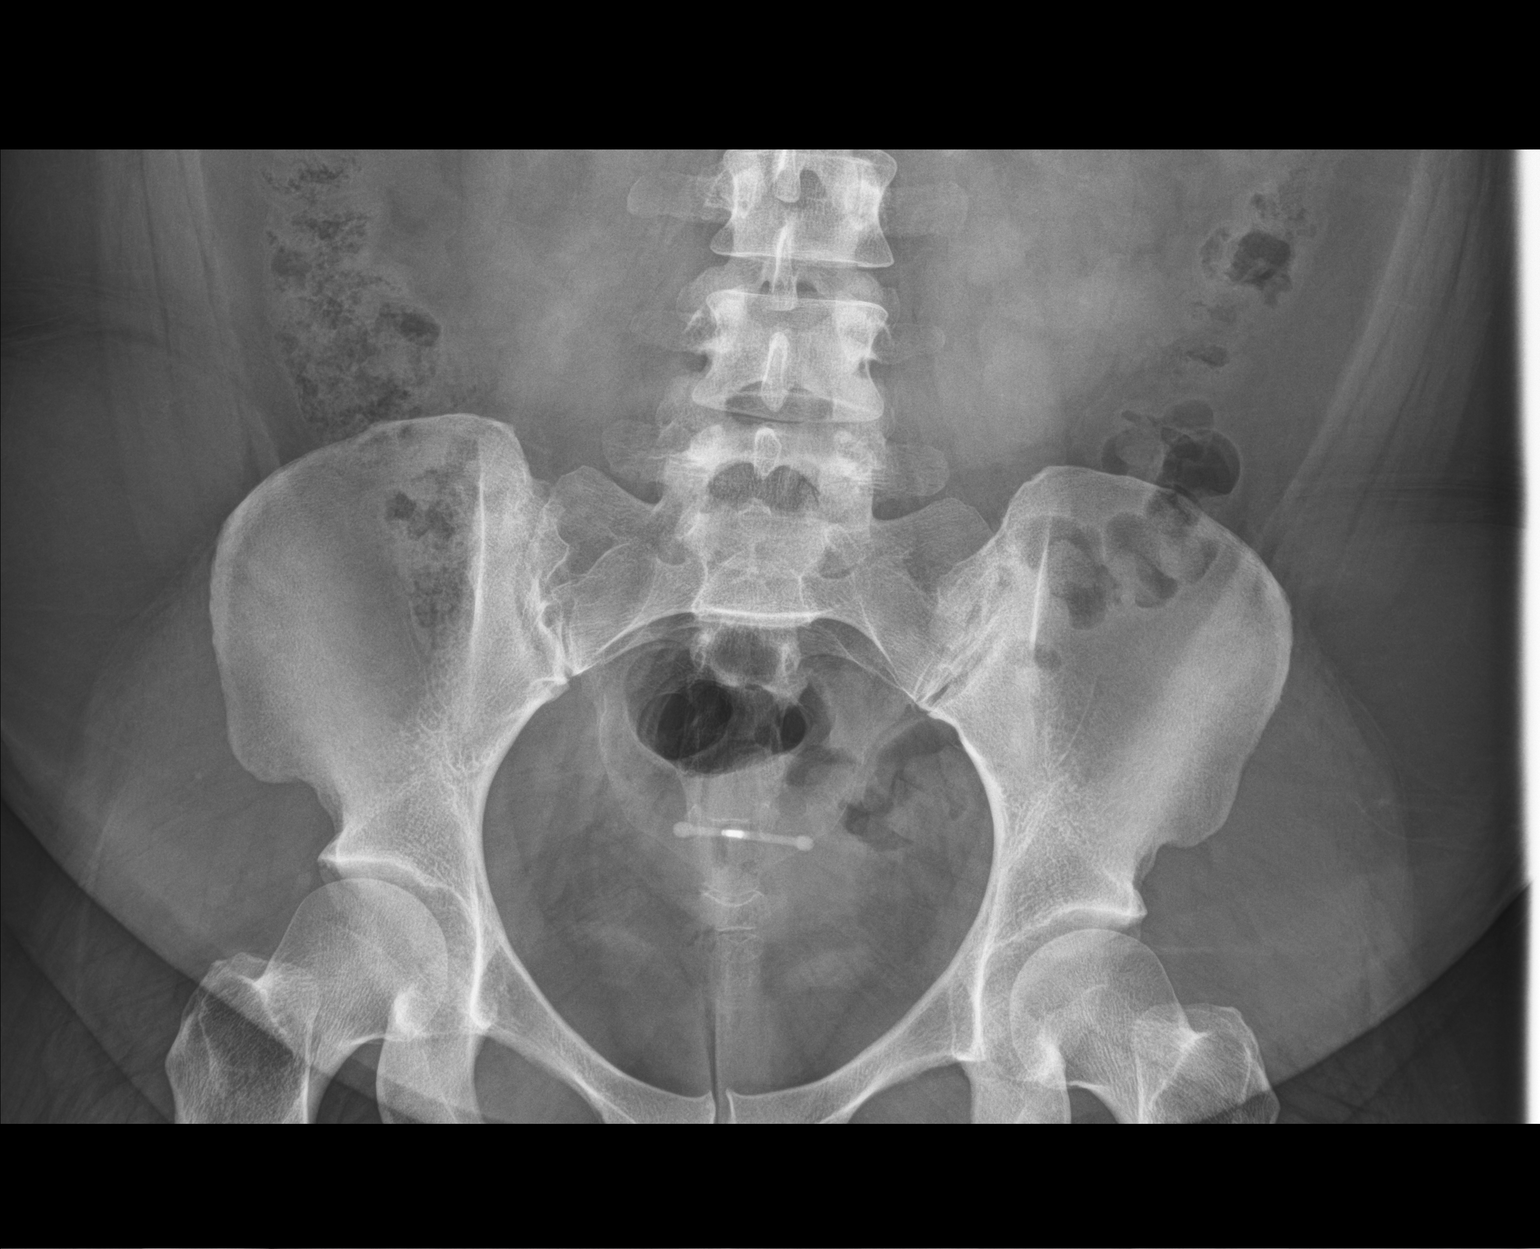

[2 of 2 positions shown; findings below may reference images not displayed]

FINDINGS: Nonobstructive bowel gas pattern with moderate amount of stool in
the colon. There is an IUD in the pelvis. Bowel gas and stool is
overlying the kidneys but no large renal stones. Lung bases are
clear.
IMPRESSION: 1. No evidence for urinary calculi but limited evaluation of the
kidneys due to overlying bowel gas and stool.
2. Moderate stool burden.

## 2021-02-05 ENCOUNTER — Emergency Department
Admission: EM | Admit: 2021-02-05 | Discharge: 2021-02-05 | Disposition: A | Discharge status: Home / Self Care | Payer: Medicaid Other | Attending: Student in an Organized Health Care Education/Training Program | Admitting: Student in an Organized Health Care Education/Training Program

## 2021-02-05 ENCOUNTER — Other Ambulatory Visit: Payer: Self-pay

## 2021-02-05 ENCOUNTER — Encounter: Payer: Self-pay | Admitting: Emergency Medicine

## 2021-02-05 ENCOUNTER — Emergency Department: Payer: Medicaid Other

## 2021-02-05 DIAGNOSIS — R079 Chest pain, unspecified: Secondary | ICD-10-CM | POA: Diagnosis not present

## 2021-02-05 DIAGNOSIS — Z91040 Latex allergy status: Secondary | ICD-10-CM | POA: Insufficient documentation

## 2021-02-05 DIAGNOSIS — R0789 Other chest pain: Secondary | ICD-10-CM

## 2021-02-05 DIAGNOSIS — I5021 Acute systolic (congestive) heart failure: Secondary | ICD-10-CM | POA: Insufficient documentation

## 2021-02-05 DIAGNOSIS — Z7982 Long term (current) use of aspirin: Secondary | ICD-10-CM | POA: Diagnosis not present

## 2021-02-05 DIAGNOSIS — E119 Type 2 diabetes mellitus without complications: Secondary | ICD-10-CM | POA: Insufficient documentation

## 2021-02-05 LAB — CBC
HCT: 40.4 % (ref 36.0–46.0)
Hemoglobin: 13.8 g/dL (ref 12.0–15.0)
MCH: 29.2 pg (ref 26.0–34.0)
MCHC: 34.2 g/dL (ref 30.0–36.0)
MCV: 85.4 fL (ref 80.0–100.0)
Platelets: 355 10*3/uL (ref 150–400)
RBC: 4.73 MIL/uL (ref 3.87–5.11)
RDW: 12.3 % (ref 11.5–15.5)
WBC: 8.3 10*3/uL (ref 4.0–10.5)
nRBC: 0 % (ref 0.0–0.2)

## 2021-02-05 LAB — BASIC METABOLIC PANEL
Anion gap: 9 (ref 5–15)
BUN: 12 mg/dL (ref 6–20)
CO2: 26 mmol/L (ref 22–32)
Calcium: 9.3 mg/dL (ref 8.9–10.3)
Chloride: 102 mmol/L (ref 98–111)
Creatinine, Ser: 0.66 mg/dL (ref 0.44–1.00)
GFR, Estimated: >60 mL/min (ref >60)
Glucose, Bld: 104 mg/dL — ABNORMAL HIGH (ref 70–99)
Potassium: 3.8 mmol/L (ref 3.5–5.1)
Sodium: 137 mmol/L (ref 135–145)

## 2021-02-05 LAB — TROPONIN I (HIGH SENSITIVITY)
Troponin I (High Sensitivity): <2 ng/L (ref <18)
Troponin I (High Sensitivity): <2 ng/L (ref <18)

## 2021-02-05 LAB — HCG, QUANTITATIVE, PREGNANCY: hCG, Beta Chain, Quant, S: <1 m[IU]/mL (ref <5)

## 2021-02-05 IMAGING — CR DG CHEST 2V
2 series · 2 of 2 positions shown · non-contrast
Comparison: [DATE]

CLINICAL DATA: Chest pain

EXAM:
CHEST - 2 VIEW

[chest pa]
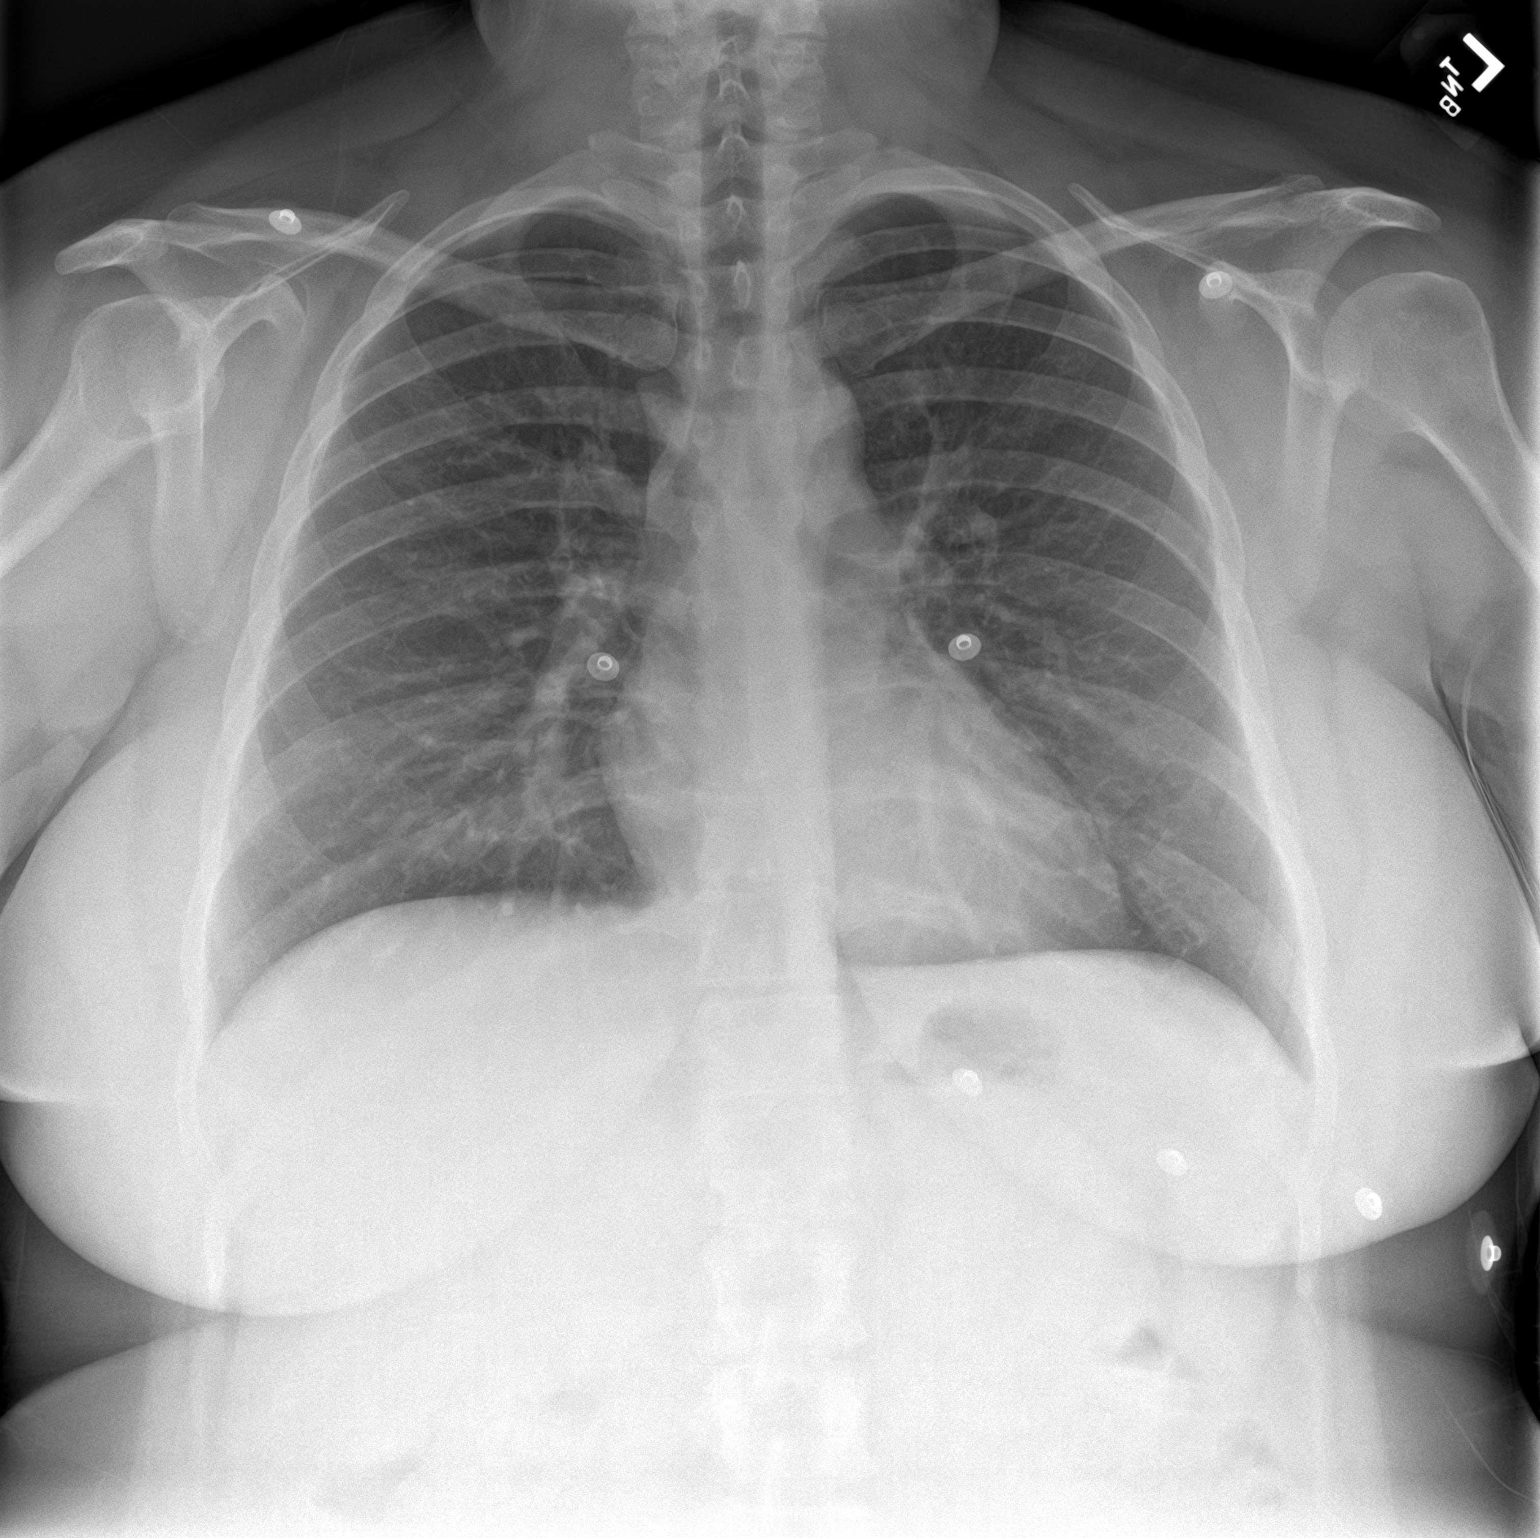

[chest lat]
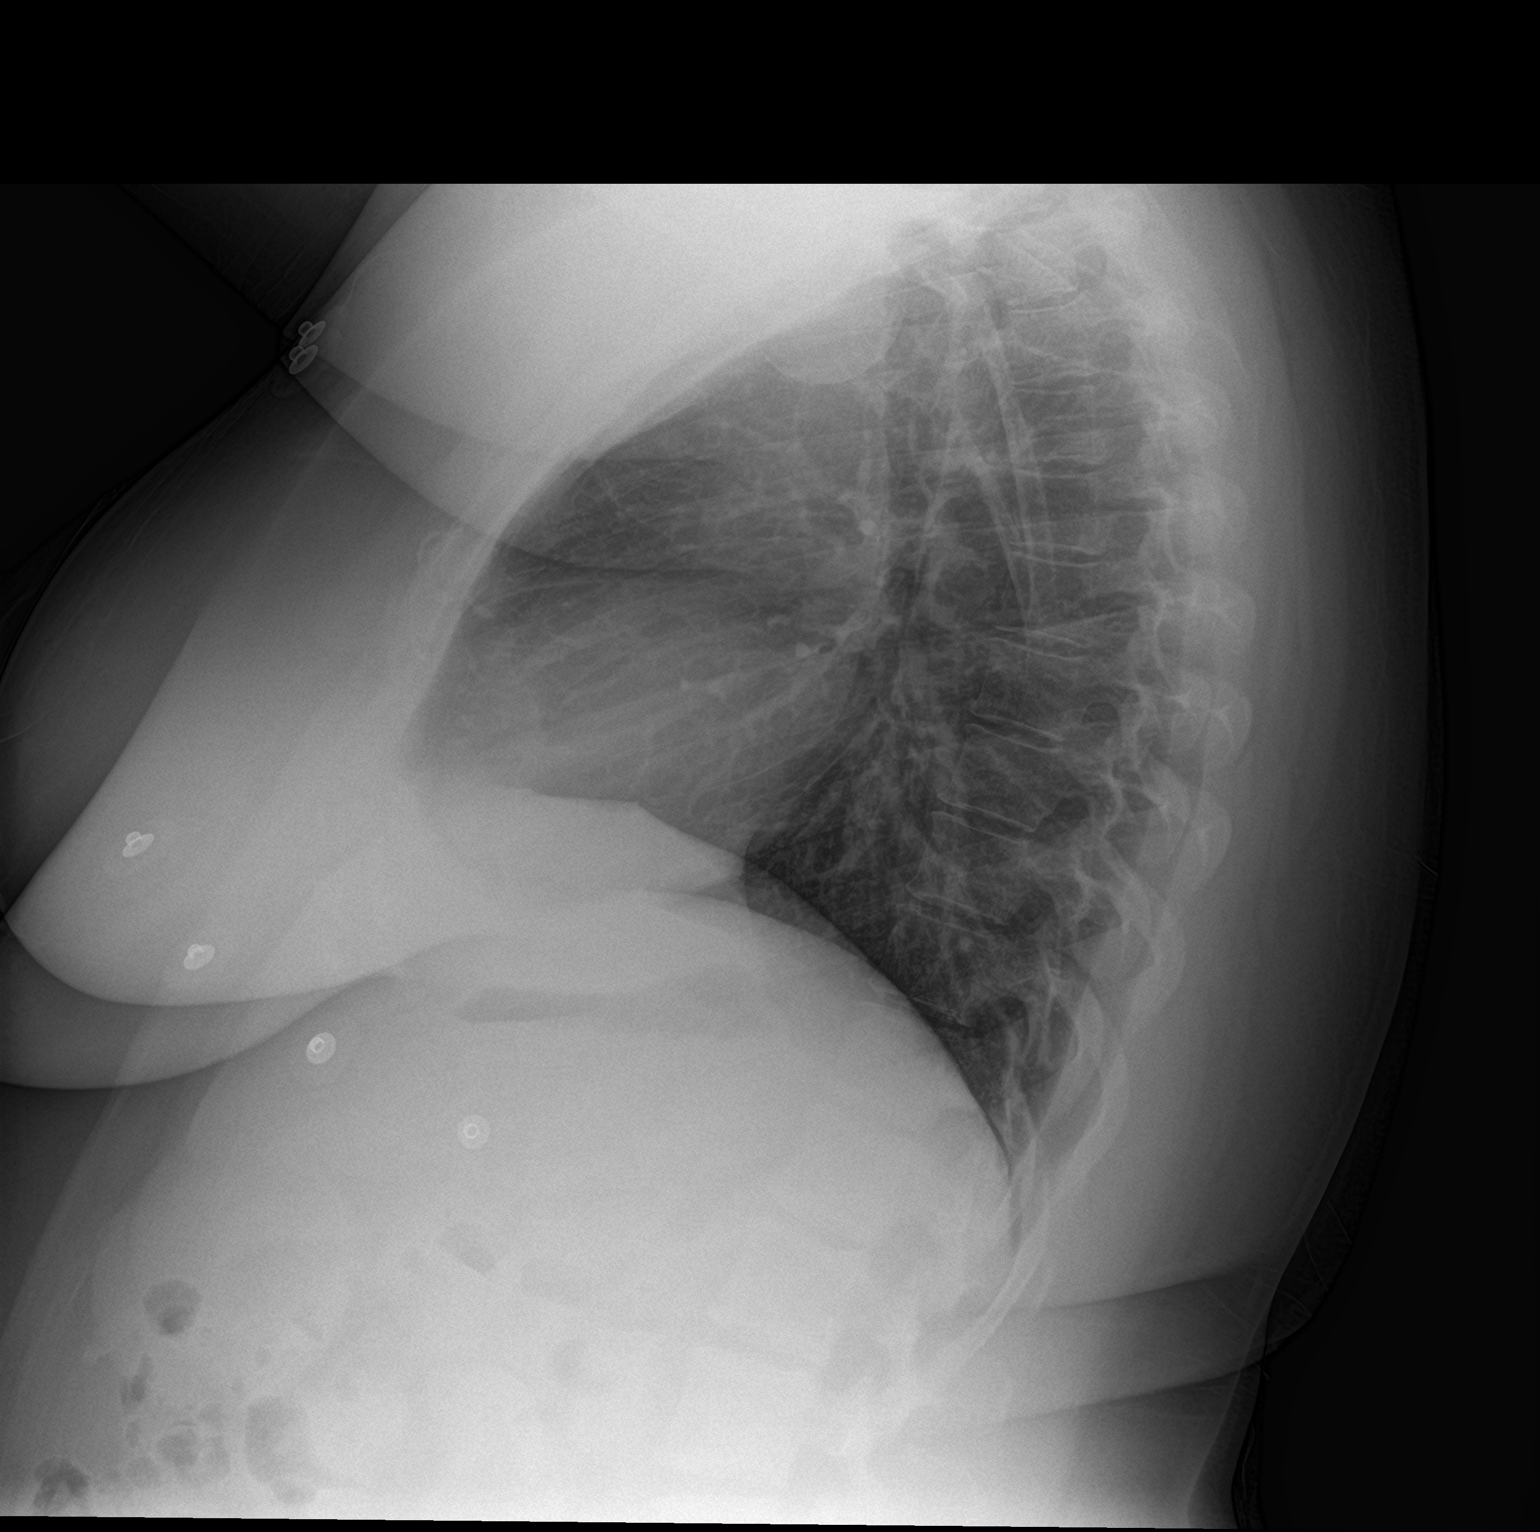

[2 of 2 positions shown; findings below may reference images not displayed]

FINDINGS: Lungs are clear. Heart size and pulmonary vascularity are normal. No
adenopathy. No pneumothorax. No bone lesions.
IMPRESSION: Lungs clear.  Cardiac silhouette normal.

## 2021-02-05 MED ORDER — AMIODARONE HCL IN DEXTROSE 360-4.14 MG/200ML-% IV SOLN: 30.0000 mg/h | INTRAVENOUS | Status: DC

## 2021-02-05 MED ORDER — OXYCODONE-ACETAMINOPHEN 5-325 MG PO TABS
1.0000 | ORAL_TABLET | Freq: Once | ORAL | Status: AC
  Administered 2021-02-05: 1 via ORAL
  Filled 2021-02-05: qty 1

## 2021-02-05 MED ORDER — AMIODARONE HCL IN DEXTROSE 360-4.14 MG/200ML-% IV SOLN: 60.0000 mg/h | INTRAVENOUS | Status: DC

## 2021-02-05 MED ORDER — DIAZEPAM 5 MG PO TABS
5.0000 mg | ORAL_TABLET | Freq: Once | ORAL | Status: AC
  Administered 2021-02-05: 5 mg via ORAL
  Filled 2021-02-05: qty 1

## 2021-02-05 NOTE — ED Notes (Signed)
Pt reports pain remains unrelieved by medications administered, MD made aware. Pt medicated per MAR. Lights dimmed for pt comfort, pt watching tv in room. Breathing regular and unlabored. AO x4

## 2021-02-05 NOTE — ED Provider Notes (Signed)
[facility] Emergency Department Provider Note    Event Date/Time   First MD Initiated Contact with Patient 02/05/21 1025     (approximate)  I have reviewed the triage vital signs and the nursing notes.   HISTORY  Chief Complaint Chest Pain    HPI NIKESHIA KEETCH is a 25 y.o. female with recent diagnosis of Takotsubo cardiomyopathy presents to the ER for recurrent left-sided chest pain pressure tightness.  Feels similar to when she was admitted and evaluated for recent diagnosis.  Denies any fevers.  No pain with deep inspiration.  Not on OCP.  Denies any leg swelling.  Denies any cough or congestion.  States that she does have history anxiety has been taking her medications.  Does not smoke.    Past Medical History:  Diagnosis Date  . ADHD    Family History  Problem Relation Age of Onset  . Heart murmur Mother   . Mitral valve prolapse Mother   . Heart attack Father   . Atrial fibrillation Maternal Grandfather   . Stroke Paternal Grandmother   . Mitral valve prolapse Paternal Grandmother    Past Surgical History:  Procedure Laterality Date  . CYST REMOVAL TRUNK  2019  . GALLBLADDER SURGERY  2016  . KIDNEY SURGERY    . LEFT HEART CATH AND CORONARY ANGIOGRAPHY N/A 01/01/2021   Procedure: LEFT HEART CATH AND CORONARY ANGIOGRAPHY;  Surgeon: Alwyn Pea, MD;  Location: [facility] INVASIVE CV LAB;  Service: Cardiovascular;  Laterality: N/A;  . OVARIAN CYST SURGERY  2011  . TONSILLECTOMY     Patient Active Problem List   Diagnosis Date Noted  . Takotsubo cardiomyopathy 01/01/2021  . Acute systolic CHF (congestive heart failure) (HCC) 01/01/2021  . Chest pain 12/31/2020  . Pyelonephritis 12/31/2020  . Diabetes mellitus without complication (HCC)   . ADHD   . Elevated troponin   . Depression with anxiety   . Kidney stones       Prior to Admission medications   Medication Sig Start Date End Date Taking? Authorizing Provider  ARIPiprazole  (ABILIFY) 2 MG tablet Take 2 mg by mouth at bedtime. 05/25/20   [provider]  aspirin EC 81 MG EC tablet Take 1 tablet (81 mg total) by mouth daily. Swallow whole. 01/02/21   Marrion Coy, MD  atorvastatin (LIPITOR) 40 MG tablet Take 1 tablet (40 mg total) by mouth daily. 01/02/21   Marrion Coy, MD  busPIRone (BUSPAR) 30 MG tablet Take 30 mg by mouth 2 (two) times daily. 10/18/19   [provider]  CONCERTA 27 MG CR tablet Take 27 mg by mouth at bedtime. 09/29/20   [provider]  diazepam (VALIUM) 5 MG tablet Take 1 tablet (5 mg) by mouth on arrival for MRI 01/14/21   Duke Salvia, MD  FLUoxetine (PROZAC) 20 MG capsule Take 60 mg by mouth every morning. 12/08/20   [provider]  hyoscyamine (LEVSIN SL) 0.125 MG SL tablet Take 0.125 mg by mouth every 8 (eight) hours as needed. 10/27/20   [provider]  levonorgestrel (MIRENA) 20 MCG/24HR IUD 1 each by Intrauterine route once.    [provider]  oxybutynin (DITROPAN-XL) 10 MG 24 hr tablet Take 10 mg by mouth daily. 11/24/20   [provider]  propranolol (INDERAL) 20 MG tablet TAKE 1 TABLET(20 MG) BY MOUTH TWICE DAILY 05/19/20   [provider]  dicyclomine (BENTYL) 10 MG capsule  04/19/18 10/19/20  [provider]  Allergies Wound dressing adhesive and Latex    Social History Social History   Tobacco Use  . Smoking status: Never Smoker  . Smokeless tobacco: Never Used  Vaping Use  . Vaping Use: Never used  Substance Use Topics  . Alcohol use: No  . Drug use: No    Review of Systems Patient denies headaches, rhinorrhea, blurry vision, numbness, shortness of breath, chest pain, edema, cough, abdominal pain, nausea, vomiting, diarrhea, dysuria, fevers, rashes or hallucinations unless otherwise stated above in HPI. ____________________________________________   PHYSICAL EXAM:  VITAL SIGNS: Vitals:   02/05/21 1100 02/05/21 1200  BP: (!) 96/56 (!)  98/59  Pulse: 87 60  Resp: 16 19  Temp:    SpO2: 99% 97%    Constitutional: Alert and oriented.  Eyes: Conjunctivae are normal.  Head: Atraumatic. Nose: No congestion/rhinnorhea. Mouth/Throat: Mucous membranes are moist.   Neck: No stridor. Painless ROM.  Cardiovascular: Normal rate, regular rhythm. Grossly normal heart sounds.  Good peripheral circulation. Respiratory: Normal respiratory effort.  No retractions. Lungs CTAB. Gastrointestinal: Soft and nontender. No distention. No abdominal bruits. No CVA tenderness. Genitourinary:  Musculoskeletal: No lower extremity tenderness nor edema.  No joint effusions. Neurologic:  Normal speech and language. No gross focal neurologic deficits are appreciated. No facial droop Skin:  Skin is warm, dry and intact. No rash noted. Psychiatric: Mood and affect are normal. Speech and behavior are normal.  ____________________________________________   LABS (all labs ordered are listed, but only abnormal results are displayed)  Results for orders placed or performed during the hospital encounter of 02/05/21 (from the past 24 hour(s))  Basic metabolic panel     Status: Abnormal   Collection Time: 02/05/21  9:47 AM  Result Value Ref Range   Sodium 137 135 - 145 mmol/L   Potassium 3.8 3.5 - 5.1 mmol/L   Chloride 102 98 - 111 mmol/L   CO2 26 22 - 32 mmol/L   Glucose, Bld 104 (H) 70 - 99 mg/dL   BUN 12 6 - 20 mg/dL   Creatinine, Ser 4.00 0.44 - 1.00 mg/dL   Calcium 9.3 8.9 - 86.7 mg/dL   GFR, Estimated >61 >95 mL/min   Anion gap 9 5 - 15  CBC     Status: None   Collection Time: 02/05/21  9:47 AM  Result Value Ref Range   WBC 8.3 4.0 - 10.5 K/uL   RBC 4.73 3.87 - 5.11 MIL/uL   Hemoglobin 13.8 12.0 - 15.0 g/dL   HCT 09.3 26.7 - 12.4 %   MCV 85.4 80.0 - 100.0 fL   MCH 29.2 26.0 - 34.0 pg   MCHC 34.2 30.0 - 36.0 g/dL   RDW 58.0 99.8 - 33.8 %   Platelets 355 150 - 400 K/uL   nRBC 0.0 0.0 - 0.2 %  Troponin I (High Sensitivity)     Status:  None   Collection Time: 02/05/21  9:47 AM  Result Value Ref Range   Troponin I (High Sensitivity) <2 <18 ng/L  Troponin I (High Sensitivity)     Status: None   Collection Time: 02/05/21 11:23 AM  Result Value Ref Range   Troponin I (High Sensitivity) <2 <18 ng/L   ____________________________________________  EKG My review and personal interpretation at Time: 9:17   Indication: chest pain  Rate: 80  Rhythm: sinus Axis: normal Other: normal intervals, no stemi ____________________________________________  RADIOLOGY  I personally reviewed all radiographic images ordered to evaluate for the above acute complaints and reviewed radiology  reports and findings.  These findings were personally discussed with the patient.  Please see medical record for radiology report. ____________________________________________   PROCEDURES  Procedure(s) performed:  Procedures    Critical Care performed: no ____________________________________________   INITIAL IMPRESSION / ASSESSMENT AND PLAN / ED COURSE  Pertinent labs & imaging results that were available during my care of the patient were reviewed by me and considered in my medical decision making (see chart for details).   DDX: ACS, pericarditis, esophagitis, boerhaaves, pe, dissection, pna, bronchitis, costochondritis   JAKLYN ALEN is a 25 y.o. who presents to the ED with presentation as described above.  Patient in no acute distress.  Afebrile hemodynamically stable.  EKG nonischemic.  Initial troponin negative.  Recent cardiac cath with clean coronaries.  She is low risk by Wells criteria is PERC negative.  Will repeat troponin will trial dose of anxiolysis.  Her abdominal exam is soft benign.  Clinical Course as of 02/05/21 1302  Fri Feb 05, 2021  1300 Patient reassessed.  Feels well at this point.  Serial enzymes negative.  Hemodynamically stable.  Denies any shortness of breath.  No wheezing on exam.  Do believe she stable  appropriate for outpatient follow-up she has upcoming appointment with cardiologist.  Have discussed with the patient and available family all diagnostics and treatments performed thus far and all questions were answered to the best of my ability. The patient demonstrates understanding and agreement with plan.  [PR]    Clinical Course User Index [PR] Willy Eddy, MD    The patient was evaluated in Emergency Department today for the symptoms described in the history of present illness. He/she was evaluated in the context of the global COVID-19 pandemic, which necessitated consideration that the patient might be at risk for infection with the SARS-CoV-2 virus that causes COVID-19. Institutional protocols and algorithms that pertain to the evaluation of patients at risk for COVID-19 are in a state of rapid change based on information released by regulatory bodies including the CDC and federal and state organizations. These policies and algorithms were followed during the patient's care in the ED.  As part of my medical decision making, I reviewed the following data within the electronic MEDICAL RECORD NUMBER Nursing notes reviewed and incorporated, Labs reviewed, notes from prior ED visits and [place] Controlled Substance Database   ____________________________________________   FINAL CLINICAL IMPRESSION(S) / ED DIAGNOSES  Final diagnoses:  Atypical chest pain      NEW MEDICATIONS STARTED DURING THIS VISIT:  New Prescriptions   No medications on file     Note:  This document was prepared using Dragon voice recognition software and may include unintentional dictation errors.    Willy Eddy, MD 02/05/21 1302

## 2021-02-05 NOTE — ED Triage Notes (Signed)
Pt to ED via POV c/o chest pain since around 0700, pt states that she was here a few weeks ago with chest pain and had to have a cath, cath was negative, pt was diagnosed with Takotsubo cardiomyopathy and NSTEMI. Pt states that pain is in the center of her chest. Pt states that the pain radiates into her left arm. Pt states that she woke up with the pain this morning, Pt states that she thought it was heartburn but the pain has slowly gotten worse. Pt states that she is also having nausea. Pt is in NAD.

## 2021-02-18 ENCOUNTER — Telehealth: Payer: Medicaid Other | Admitting: Internal Medicine

## 2021-02-22 ENCOUNTER — Telehealth (HOSPITAL_COMMUNITY): Payer: Self-pay | Admitting: Emergency Medicine

## 2021-02-22 NOTE — Telephone Encounter (Signed)
Attempted to call patient regarding upcoming cardiac MR appointment. Left message on voicemail with name and callback number Suleika Donavan RN Navigator Cardiac Imaging [facility] [phone] Office [phone] Cell  

## 2021-02-24 ENCOUNTER — Ambulatory Visit (HOSPITAL_COMMUNITY)
Admission: RE | Admit: 2021-02-24 | Discharge: 2021-02-24 | Disposition: A | Discharge status: Home / Self Care | Payer: Medicaid Other | Source: Ambulatory Visit | Attending: Internal Medicine | Admitting: Internal Medicine

## 2021-02-24 ENCOUNTER — Other Ambulatory Visit: Payer: Self-pay

## 2021-02-24 DIAGNOSIS — I5181 Takotsubo syndrome: Secondary | ICD-10-CM | POA: Diagnosis not present

## 2021-02-24 IMAGING — MR MR CARD MORPHOLOGY WO/W CM
45 of 48 series · 45 of 48 positions shown · IV contrast (Contrast agent)
Comparison: none

CLINICAL DATA: Evaluate for myocarditis or stress induced
cardiomyopathy

EXAM:
CARDIAC MRI
TECHNIQUE: The patient was scanned on a 1.5 Tesla GE magnet. A dedicated
cardiac coil was used. Functional imaging was done using Fiesta
sequences. [DATE], and 4 chamber views were done to assess for RWMA's.
Modified NYA rule using a short axis stack was used to
calculate an ejection fraction on a dedicated work station using
Circle software. The patient received 12 cc of Gadavist. After 10
minutes inversion recovery sequences were used to assess for
infiltration and scar tissue.
CONTRAST:  12 cc  of Gadavist

[Series 4: [id number]_bh · axial · 8.0mm · 1.41mm/px · 1 of 16 slices shown]
[im 1/16]
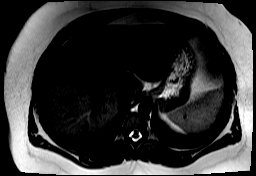

[Series 8: bSSFP · oblique · 8.0mm · 1.61mm/px · 1 of 25 slices shown (1 of 18)]
[im 1/25]
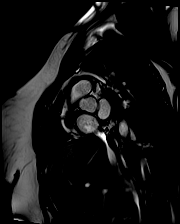

[Series 9: bSSFP · oblique · 8.0mm · 1.61mm/px · 1 of 25 slices shown (2 of 18)]
[im 1/25]
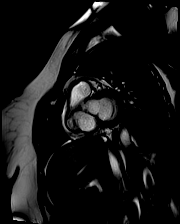

[Series 10: bSSFP · oblique · 8.0mm · 1.61mm/px · 1 of 25 slices shown (3 of 18)]
[im 1/25]
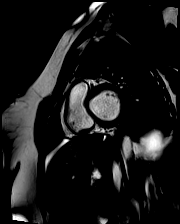

[Series 11: bSSFP · oblique · 8.0mm · 1.61mm/px · 1 of 25 slices shown (4 of 18)]
[im 1/25]
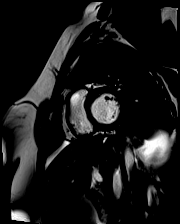

[Series 12: bSSFP · oblique · 8.0mm · 1.61mm/px · 1 of 25 slices shown (5 of 18)]
[im 1/25]
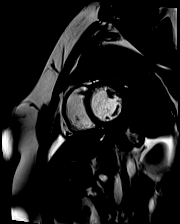

[Series 13: bSSFP · oblique · 8.0mm · 1.61mm/px · 1 of 25 slices shown (6 of 18)]
[im 1/25]
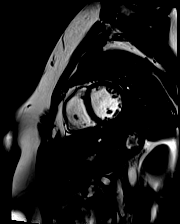

[Series 14: bSSFP · oblique · 8.0mm · 1.61mm/px · 1 of 25 slices shown (7 of 18)]
[im 1/25]
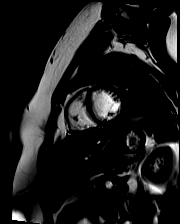

[Series 15: bSSFP · oblique · 8.0mm · 1.61mm/px · 1 of 25 slices shown (8 of 18)]
[im 1/25]
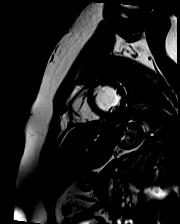

[Series 16: bSSFP · oblique · 8.0mm · 1.61mm/px · 1 of 25 slices shown (9 of 18)]
[im 1/25]
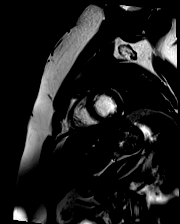

[Series 17: bSSFP · oblique · 8.0mm · 1.61mm/px · 1 of 25 slices shown (10 of 18)]
[im 1/25]
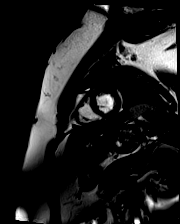

[Series 18: bSSFP · oblique · 8.0mm · 1.61mm/px · 1 of 25 slices shown (11 of 18)]
[im 1/25]
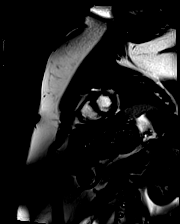

[Series 19: bSSFP · oblique · 8.0mm · 1.61mm/px · 1 of 25 slices shown (12 of 18)]
[im 1/25]
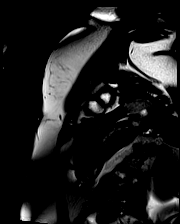

[Series 20: bSSFP · oblique · 8.0mm · 1.61mm/px · 1 of 25 slices shown (13 of 18)]
[im 1/25]
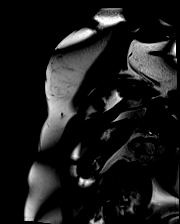

[Series 21: bSSFP · oblique · 8.0mm · 1.61mm/px · 1 of 25 slices shown (14 of 18)]
[im 1/25]
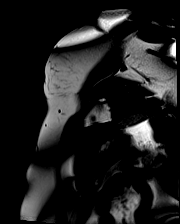

[Series 22: bSSFP · oblique · 8.0mm · 1.61mm/px · 1 of 25 slices shown (15 of 18)]
[im 1/25]
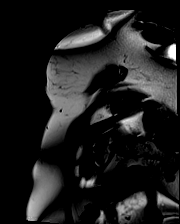

[Series 23: STIR · oblique · 8.0mm · 1.92mm/px · 1 of 17 slices shown]
[im 1/17]
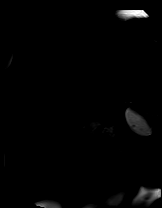

[Series 24: 4ch stack · axial · 6.0mm · 1.61mm/px · 1 of 25 slices shown (1 of 12)]
[im 1/25]
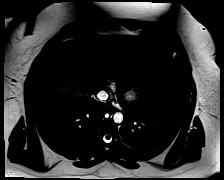

[Series 24: 4ch stack · axial · 6.0mm · 1.61mm/px · 1 of 25 slices shown (2 of 12)]
[im 1/25]
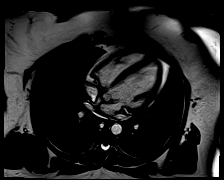

[Series 24: 4ch stack · axial · 6.0mm · 1.61mm/px · 1 of 25 slices shown (3 of 12)]
[im 1/25]
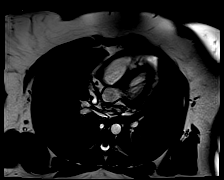

[Series 24: 4ch stack · axial · 6.0mm · 1.61mm/px · 1 of 25 slices shown (4 of 12)]
[im 1/25]
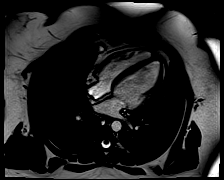

[Series 24: 4ch stack · axial · 6.0mm · 1.61mm/px · 1 of 25 slices shown (5 of 12)]
[im 1/25]
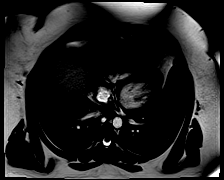

[Series 24: 4ch stack · axial · 6.0mm · 1.61mm/px · 1 of 25 slices shown (6 of 12)]
[im 1/25]
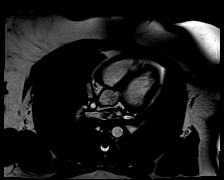

[Series 24: 4ch stack · axial · 6.0mm · 1.61mm/px · 1 of 25 slices shown (7 of 12)]
[im 1/25]
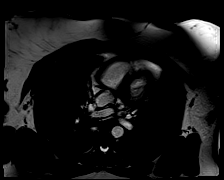

[Series 24: 4ch stack · axial · 6.0mm · 1.61mm/px · 1 of 25 slices shown (8 of 12)]
[im 1/25]
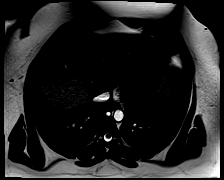

[Series 24: 4ch stack · axial · 6.0mm · 1.61mm/px · 1 of 25 slices shown (9 of 12)]
[im 1/25]
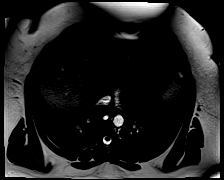

[Series 24: 4ch stack · axial · 6.0mm · 1.61mm/px · 1 of 25 slices shown (10 of 12)]
[im 1/25]
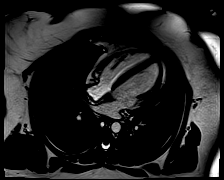

[Series 24: 4ch stack · axial · 6.0mm · 1.61mm/px · 1 of 25 slices shown (11 of 12)]
[im 1/25]
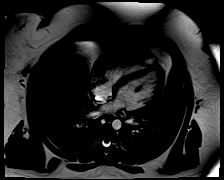

[Series 24: 4ch stack · axial · 6.0mm · 1.61mm/px · 1 of 25 slices shown (12 of 12)]
[im 1/25]
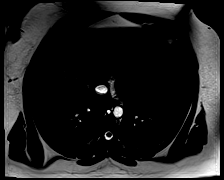

[Series 26: bSSFP · coronal · 6.0mm · 1.41mm/px · 1 of 25 slices shown (16 of 18)]
[im 1/25]
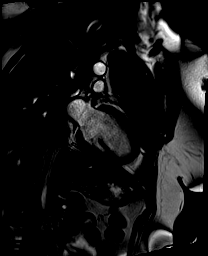

[Series 27: bSSFP · oblique · 6.0mm · 1.41mm/px · 1 of 25 slices shown (17 of 18)]
[im 1/25]
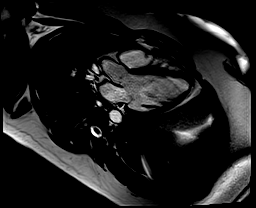

[Series 28: bSSFP · oblique · 6.0mm · 1.41mm/px · 1 of 25 slices shown (18 of 18)]
[im 1/25]
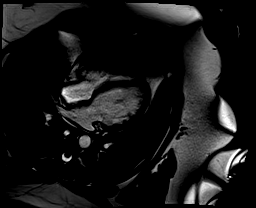

[Series 30: (id)_long_t1_moco · oblique · 8.0mm · 1.56mm/px · 1 of 40 slices shown (1 of 2)]
[im 1/40]
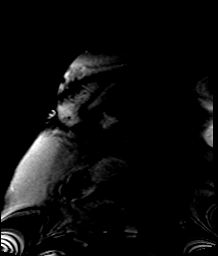

[Series 31: (id)_long_t1_moco_t1 · 1 of 4 slices shown (1 of 3)]
[im 1/4]
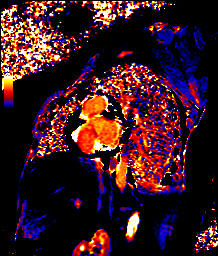

[Series 31: (id)_long_t1_moco_t1 · oblique · 8.0mm · 1.56mm/px · 1 of 5 slices shown (2 of 3)]
[im 1/5]
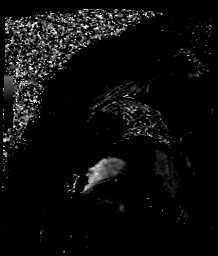

[Series 33: (id)_trufi · oblique · 8.0mm · 2.08mm/px · 1 of 9 slices shown]
[im 1/9]
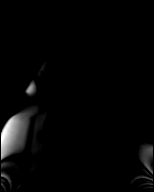

[Series 34: (id)_trufi_moco · oblique · 8.0mm · 2.08mm/px · 1 of 9 slices shown]
[im 1/9]
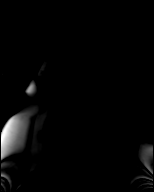

[Series 35: (id)_trufi_moco_t2 · oblique · 8.0mm · 2.08mm/px · 1 of 3 slices shown]
[im 1/3]
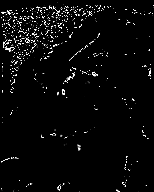

[Series 37: (id)_long_t1 · oblique · 8.0mm · 1.56mm/px · 1 of 24 slices shown]
[im 1/24]
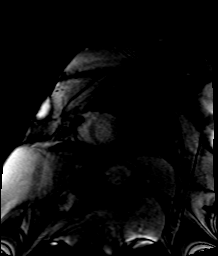

[Series 38: (id)_long_t1_moco · oblique · 8.0mm · 1.56mm/px · 1 of 24 slices shown (2 of 2)]
[im 1/24]
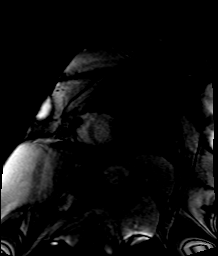

[Series 39: (id)_long_t1_moco_t1 · oblique · 8.0mm · 1.56mm/px · 1 of 3 slices shown (3 of 3)]
[im 1/3]
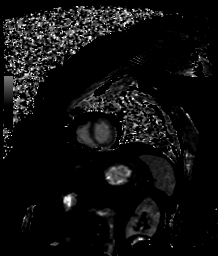

[Series 41: pre short axis · oblique · non-contrast · 8.0mm · 2.25mm/px · 1 of 10 slices shown (1 of 4)]
[im 1/10]
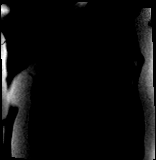

[Series 42: pre short axis · oblique · non-contrast · 8.0mm · 2.25mm/px · 1 of 10 slices shown (2 of 4)]
[im 1/10]
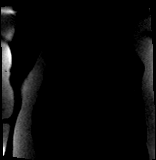

[Series 43: pre short axis · oblique · non-contrast · 8.0mm · 2.25mm/px · 1 of 10 slices shown (3 of 4)]
[im 1/10]
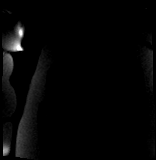

[Series 44: pre short axis · oblique · non-contrast · 8.0mm · 2.25mm/px · 1 of 10 slices shown (4 of 4)]
[im 1/10]
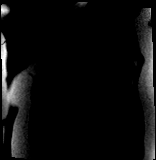

[45 of 48 positions shown; findings below may reference images not displayed]

FINDINGS: 1. Normal left ventricular size, thickness and systolic function
(LVEF = 59%). There are no regional wall motion abnormalities.

There is no late gadolinium enhancement in the left ventricular
myocardium.

LVEDV: 134 ml

LVESV: 54 ml

SV: 80 ml

CO: 5.7 L/min

Myocardial mass: 83g

2. Normal right ventricular size, thickness and systolic function
(RVEF = 52%). There are no regional wall motion abnormalities.

3.  Normal left and right atrial size.

4. Normal size of the aortic root, ascending aorta and pulmonary
artery.

5.  No significant valvular abnormalities.

6.  Normal pericardium.  No pericardial effusion.
IMPRESSION: 1.  Normal LV and RV size and function, LVEF 59%.

2.  No wall motion abnormalities noted.

3.  No evidence for late gadolinium enhancement (LGE) or scar.

4.  No evidence for infiltrative disease.

5. Normal Cardiac MRI with no evidence for myocarditis or Takotsubo
cardiomyopathy.

NYA

## 2021-02-24 MED ORDER — GADOBUTROL 1 MMOL/ML IV SOLN
12.0000 mL | Freq: Once | INTRAVENOUS | Status: AC | PRN
  Administered 2021-02-24: 12 mL via INTRAVENOUS

## 2021-02-26 DIAGNOSIS — K760 Fatty (change of) liver, not elsewhere classified: Secondary | ICD-10-CM | POA: Diagnosis not present

## 2021-02-26 DIAGNOSIS — R110 Nausea: Secondary | ICD-10-CM | POA: Diagnosis not present

## 2021-02-26 DIAGNOSIS — Z20822 Contact with and (suspected) exposure to covid-19: Secondary | ICD-10-CM | POA: Diagnosis not present

## 2021-02-26 DIAGNOSIS — R109 Unspecified abdominal pain: Secondary | ICD-10-CM | POA: Diagnosis not present

## 2021-02-26 DIAGNOSIS — N200 Calculus of kidney: Secondary | ICD-10-CM | POA: Diagnosis not present

## 2021-03-02 DIAGNOSIS — K760 Fatty (change of) liver, not elsewhere classified: Secondary | ICD-10-CM | POA: Diagnosis not present

## 2021-03-02 DIAGNOSIS — N200 Calculus of kidney: Secondary | ICD-10-CM | POA: Diagnosis not present

## 2021-03-02 DIAGNOSIS — M5459 Other low back pain: Secondary | ICD-10-CM | POA: Diagnosis not present

## 2021-03-02 DIAGNOSIS — N29 Other disorders of kidney and ureter in diseases classified elsewhere: Secondary | ICD-10-CM | POA: Diagnosis not present

## 2021-03-04 ENCOUNTER — Other Ambulatory Visit: Payer: Self-pay

## 2021-03-04 ENCOUNTER — Telehealth: Payer: Medicaid Other | Admitting: Internal Medicine

## 2021-03-04 NOTE — Progress Notes (Unsigned)
Electrophysiology TeleHealth Note   Due to national recommendations of social distancing due to COVID 19, an audio/video telehealth visit is felt to be most appropriate for this patient at this time.  See MyChart message from today for the patient's consent to telehealth for [facility].   Date:  03/04/2021   ID:  Sandra Gibbs, DOB Jul 29, 1996, MRN [id number]  Location: patient's home  Provider location: [address], [place]  Evaluation Performed: Follow-up visit  PCP:  Cathie Hoops, PA  Cardiologist:   *** Electrophysiologist:  SK   Chief Complaint:  ***  History of Present Illness:    Sandra Gibbs is a 25 y.o. female who presents via audio/video conferencing for a telehealth visit today.  Since last being seen in our clinic for palpitations in setting of prior Tn+ LV dysfunction with evaluation as below and interval cMRI, therapy not initiated 2/2 low BP   the patient reports ***   DATE TEST EF   3/22 Echo   45-50 % WMA -specifics not outlined  3/22 LHC   50-55 % Low normal No comment on WMA  5/22 cMRI  59% LGE neg     The patient denies symptoms of fevers, chills, cough, or new SOB worrisome for COVID 19. ***  Past Medical History:  Diagnosis Date  . ADHD     Past Surgical History:  Procedure Laterality Date  . CYST REMOVAL TRUNK  2019  . GALLBLADDER SURGERY  2016  . KIDNEY SURGERY    . LEFT HEART CATH AND CORONARY ANGIOGRAPHY N/A 01/01/2021   Procedure: LEFT HEART CATH AND CORONARY ANGIOGRAPHY;  Surgeon: Alwyn Pea, MD;  Location: [facility] INVASIVE CV LAB;  Service: Cardiovascular;  Laterality: N/A;  . OVARIAN CYST SURGERY  2011  . TONSILLECTOMY      Current Outpatient Medications  Medication Sig Dispense Refill  . ARIPiprazole (ABILIFY) 2 MG tablet Take 2 mg by mouth at bedtime.    Marland Kitchen aspirin EC 81 MG EC tablet Take 1 tablet (81 mg total) by mouth daily. Swallow whole. 30 tablet 0  . atorvastatin (LIPITOR) 40 MG  tablet Take 1 tablet (40 mg total) by mouth daily. 30 tablet 0  . busPIRone (BUSPAR) 30 MG tablet Take 30 mg by mouth 2 (two) times daily.    . CONCERTA 27 MG CR tablet Take 27 mg by mouth at bedtime.    . diazepam (VALIUM) 5 MG tablet Take 1 tablet (5 mg) by mouth on arrival for MRI 1 tablet 0  . FLUoxetine (PROZAC) 20 MG capsule Take 60 mg by mouth every morning.    . hyoscyamine (LEVSIN SL) 0.125 MG SL tablet Take 0.125 mg by mouth every 8 (eight) hours as needed.    Marland Kitchen levonorgestrel (MIRENA) 20 MCG/24HR IUD 1 each by Intrauterine route once.    Marland Kitchen oxybutynin (DITROPAN-XL) 10 MG 24 hr tablet Take 10 mg by mouth daily.    . propranolol (INDERAL) 20 MG tablet TAKE 1 TABLET(20 MG) BY MOUTH TWICE DAILY     No current facility-administered medications for this visit.    Allergies:   Wound dressing adhesive and Latex   Social History:  The patient  reports that she has never smoked. She has never used smokeless tobacco. She reports that she does not drink alcohol and does not use drugs.   Family History:  The patient's   family history includes Atrial fibrillation in her maternal grandfather; Heart attack in her father;  Heart murmur in her mother; Mitral valve prolapse in her mother and paternal grandmother; Stroke in her paternal grandmother.   ROS:  Please see the history of present illness.   All other systems are personally reviewed and negative.    Exam:    Vital Signs:  There were no vitals taken for this visit. ***  Well appearing, alert and conversant, regular work of breathing,  good skin color Eyes- anicteric, neuro- grossly intact, skin- no apparent rash or lesions or cyanosis, mouth- oral mucosa is pink   Labs/Other Tests and Data Reviewed:    Recent Labs: 12/31/2020: ALT 25; B Natriuretic Peptide 26.1 01/02/2021: Magnesium 2.0 02/05/2021: BUN 12; Creatinine, Ser 0.66; Hemoglobin 13.8; Platelets 355; Potassium 3.8; Sodium 137   Wt Readings from Last 3 Encounters:  02/05/21  220 lb (99.8 kg)  01/14/21 231 lb (104.8 kg)  01/01/21 229 lb 15 oz (104.3 kg)     Other studies personally reviewed: Additional studies/ records that were reviewed today include: ***  Review of the above records today demonstrates: *** Prior radiographs: ***    ASSESSMENT & PLAN:    Palpitations question mechanism  Obesity  Chest pain with positive troponin?  Myocarditis?  Tako-Tsubo  Depression psychosocial stress Palpitations question mechanism  Obesity  Chest pain with positive troponin?  Myocarditis?  Tako-Tsubo  Depression psychosocial stress *  *** COVID 19 screen The patient denies symptoms of COVID 19 at this time.  The importance of social distancing was discussed today.  Follow-up:  ***    Current medicines are reviewed at length with the patient today.   The patient {ACTIONS; HAS/DOES NOT HAVE:19233} concerns regarding her medicines.  The following changes were made today:  {NONE DEFAULTED:18576::"none"}  Labs/ tests ordered today include: *** No orders of the defined types were placed in this encounter.   Future tests ( post COVID )  *** in ***  months  Patient Risk:  after full review of this patients clinical status, I feel that they are at moderate*** risk at this time.  Today, I have spent *** minutes with the patient with telehealth technology discussing the above.  Signed, Sherryl Manges, MD  03/04/2021 8:43 AM     [facility] [address] [place] [address] [phone] (office) [phone] (fax)

## 2021-03-05 ENCOUNTER — Encounter: Payer: Self-pay | Admitting: Internal Medicine

## 2021-03-14 DIAGNOSIS — R319 Hematuria, unspecified: Secondary | ICD-10-CM | POA: Diagnosis not present

## 2021-03-14 DIAGNOSIS — R109 Unspecified abdominal pain: Secondary | ICD-10-CM | POA: Diagnosis not present

## 2021-03-14 DIAGNOSIS — R1110 Vomiting, unspecified: Secondary | ICD-10-CM | POA: Diagnosis not present

## 2021-03-14 DIAGNOSIS — R509 Fever, unspecified: Secondary | ICD-10-CM | POA: Diagnosis not present

## 2021-03-14 DIAGNOSIS — N12 Tubulo-interstitial nephritis, not specified as acute or chronic: Secondary | ICD-10-CM | POA: Diagnosis not present

## 2021-03-22 ENCOUNTER — Telehealth: Payer: Self-pay | Admitting: Internal Medicine

## 2021-03-22 DIAGNOSIS — Z0289 Encounter for other administrative examinations: Secondary | ICD-10-CM | POA: Diagnosis not present

## 2021-03-22 DIAGNOSIS — Z130 Encounter for screening for diseases of the blood and blood-forming organs and certain disorders involving the immune mechanism: Secondary | ICD-10-CM | POA: Diagnosis not present

## 2021-03-22 DIAGNOSIS — Z87448 Personal history of other diseases of urinary system: Secondary | ICD-10-CM | POA: Diagnosis not present

## 2021-03-22 NOTE — Telephone Encounter (Signed)
Attempted to call the patient. No answer- I left a detailed message of her Cardiac MRI results (ok per DPR).   I asked that she call back with any further questions/ concerns.   The patient did no show for her virtual visit with Dr. Graciela Husbands on 03/04/21.  I left a message stating I would confirm what her follow up with Dr. Graciela Husbands needed to be at this point and we will reach back out to her to schedule.

## 2021-03-22 NOTE — Telephone Encounter (Signed)
Regarding Cardiac MRI results: Duke Salvia, MD [phone])  Sent: Sat March 20, 2021  9:55 AM  To: Jefferey Pica, RN; Alois Cliche, RN          Result Note  Please Inform Patient that study was normal    Thanks

## 2021-03-22 NOTE — Telephone Encounter (Signed)
Noted  

## 2021-03-23 NOTE — Telephone Encounter (Signed)
Dr. Graciela Husbands when would you like to try to see her again?

## 2021-03-25 ENCOUNTER — Encounter: Payer: Self-pay | Admitting: Emergency Medicine

## 2021-03-25 ENCOUNTER — Other Ambulatory Visit: Payer: Self-pay

## 2021-03-25 ENCOUNTER — Ambulatory Visit
Admission: EM | Admit: 2021-03-25 | Discharge: 2021-03-25 | Disposition: A | Discharge status: Home / Self Care | Payer: Medicaid Other | Attending: Sports Medicine | Admitting: Sports Medicine

## 2021-03-25 DIAGNOSIS — Z87442 Personal history of urinary calculi: Secondary | ICD-10-CM | POA: Diagnosis not present

## 2021-03-25 DIAGNOSIS — R10A2 Flank pain, left side: Secondary | ICD-10-CM

## 2021-03-25 DIAGNOSIS — R110 Nausea: Secondary | ICD-10-CM | POA: Diagnosis not present

## 2021-03-25 DIAGNOSIS — R109 Unspecified abdominal pain: Secondary | ICD-10-CM

## 2021-03-25 LAB — POCT URINALYSIS DIP (DEVICE)
Bilirubin Urine: NEGATIVE
Glucose, UA: NEGATIVE mg/dL
Hgb urine dipstick: NEGATIVE
Ketones, ur: NEGATIVE mg/dL
Leukocytes,Ua: NEGATIVE
Nitrite: NEGATIVE
Protein, ur: 100 mg/dL — AB
Specific Gravity, Urine: >=1.03 (ref 1.005–1.030)
Urobilinogen, UA: 1 mg/dL (ref 0.0–1.0)
pH: 6 (ref 5.0–8.0)

## 2021-03-25 MED ORDER — ONDANSETRON HCL 4 MG PO TABS: 4.0000 mg | ORAL_TABLET | Freq: Four times a day (QID) | ORAL | 0 refills | Status: DC

## 2021-03-25 NOTE — Telephone Encounter (Signed)
I sent a MyChart message to the patient advising her Dr. Graciela Husbands will see her back in 6 months and the we will be in touch closer to that time to schedule.

## 2021-03-25 NOTE — ED Provider Notes (Signed)
[facility]    CSN: [id number] Arrival date & time: 03/25/21  1749      History   Chief Complaint Chief Complaint  Patient presents with   Flank Pain    left    HPI Sandra Gibbs is a 25 y.o. female.   25 year old female who presents for evaluation the above issue.  She has a long protracted history of issues with kidney stones and urinary tract infections.  I reviewed her chart in detail.  She is complaining of predominantly left-sided flank pain.  She has had it since she had a stent placed in January.  It was worse last night.  She denies any dysuria hematuria or changes in urinary function.  No increased urinary frequency.  She recently was seen in the emergency room and diagnosed with pyelonephritis.  Put on ciprofloxacin and finished that 4 days ago.  She does have a urologist but did not call him today.  She supposed be flushing her system with plenty of water but admits that she probably has been doing that as much as she should.  She is also complaining of persistent nausea.  She does have some Zofran but is getting low on it.  She reports that she has been seen by GI but has been about a year and has been scoped for some GI issues.  She does have significant past medical history for cardiomyopathy, systolic congestive heart failure, pyelonephritis, diabetes, elevated troponin in the past, renal stones, and depression and anxiety.  She denies any current chest pain or shortness of breath.  No red flag signs or symptoms elicited on history.   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/01/2021   Acute systolic CHF (congestive heart failure) (HCC) 01/01/2021   Chest pain 12/31/2020   Pyelonephritis 12/31/2020   Diabetes mellitus without complication (HCC)    ADHD    Elevated troponin    Depression with anxiety    Kidney stones     Past Surgical History:  Procedure  Laterality Date   CYST REMOVAL TRUNK  2019   GALLBLADDER SURGERY  2016   KIDNEY SURGERY     LEFT HEART CATH AND CORONARY ANGIOGRAPHY N/A 01/01/2021   Procedure: LEFT HEART CATH AND CORONARY ANGIOGRAPHY;  Surgeon: Alwyn Pea, MD;  Location: [facility] INVASIVE CV LAB;  Service: Cardiovascular;  Laterality: N/A;   OVARIAN CYST SURGERY  2011   TONSILLECTOMY      OB History   No obstetric history on file.      Home Medications    Prior to Admission medications   Medication Sig Start Date End Date Taking? Authorizing Provider  busPIRone (BUSPAR) 30 MG tablet Take 30 mg by mouth 2 (two) times daily. 10/18/19  Yes [provider]  CONCERTA 27 MG CR tablet Take 27 mg by mouth at bedtime. 09/29/20  Yes [provider]  ondansetron (ZOFRAN) 4 MG tablet Take 1 tablet (4 mg total) by mouth every 6 (six) hours. 03/25/21  Yes Delton See, MD  ARIPiprazole (ABILIFY) 2 MG tablet Take 2 mg by mouth at bedtime. 05/25/20   [provider]  aspirin EC 81 MG EC tablet Take 1 tablet (81 mg total) by mouth daily. Swallow whole. 01/02/21   Marrion Coy, MD  atorvastatin (LIPITOR) 40 MG tablet Take 1 tablet (40 mg total) by mouth daily. 01/02/21   Marrion Coy,  MD  diazepam (VALIUM) 5 MG tablet Take 1 tablet (5 mg) by mouth on arrival for MRI 01/14/21   Duke Salvia, MD  FLUoxetine (PROZAC) 20 MG capsule Take 60 mg by mouth every morning. 12/08/20   [provider]  hyoscyamine (LEVSIN SL) 0.125 MG SL tablet Take 0.125 mg by mouth every 8 (eight) hours as needed. 10/27/20   [provider]  levonorgestrel (MIRENA) 20 MCG/24HR IUD 1 each by Intrauterine route once.    [provider]  metoprolol succinate (TOPROL-XL) 50 MG 24 hr tablet Take 50 mg by mouth daily. 03/17/21   [provider]  oxybutynin (DITROPAN-XL) 10 MG 24 hr tablet Take 10 mg by mouth daily. 11/24/20   [provider]  propranolol (INDERAL) 20 MG tablet TAKE 1 TABLET(20 MG) BY  MOUTH TWICE DAILY 05/19/20   [provider]  dicyclomine (BENTYL) 10 MG capsule  04/19/18 10/19/20  [provider]    Family History Family History  Problem Relation Age of Onset   Heart murmur Mother    Mitral valve prolapse Mother    Heart attack Father    Atrial fibrillation Maternal Grandfather    Stroke Paternal Grandmother    Mitral valve prolapse Paternal Grandmother     Social History Social History   Tobacco Use   Smoking status: Never   Smokeless tobacco: Never  Vaping Use   Vaping Use: Never used  Substance Use Topics   Alcohol use: No   Drug use: No     Allergies   Wound dressing adhesive and Latex   Review of Systems Review of Systems  Constitutional:  Negative for activity change, appetite change, chills, diaphoresis, fatigue and fever.  HENT:  Negative for congestion, ear pain, postnasal drip, rhinorrhea, sinus pressure, sinus pain, sneezing and sore throat.   Eyes:  Negative for pain.  Respiratory:  Negative for cough, chest tightness and shortness of breath.   Cardiovascular:  Negative for chest pain and palpitations.  Gastrointestinal:  Positive for nausea. Negative for abdominal pain, blood in stool, constipation and diarrhea.  Genitourinary:  Positive for flank pain. Negative for dysuria, frequency, hematuria, pelvic pain, urgency, vaginal bleeding, vaginal discharge and vaginal pain.  Musculoskeletal:  Negative for back pain, myalgias and neck pain.  Skin:  Negative for color change, pallor, rash and wound.  Neurological:  Negative for dizziness, light-headedness and headaches.  All other systems reviewed and are negative.   Physical Exam Triage Vital Signs ED Triage Vitals  Enc Vitals Group     BP 03/25/21 1806 110/73     Pulse Rate 03/25/21 1806 (!) 105     Resp 03/25/21 1806 18     Temp 03/25/21 1806 99 F (37.2 C)     Temp Source 03/25/21 1806 Oral     SpO2 03/25/21 1806 97 %     Weight 03/25/21 1806 220 lb 0.3 oz  (99.8 kg)     Height 03/25/21 1806 5' (1.524 m)     Head Circumference --      Peak Flow --      Pain Score 03/25/21 1805 4     Pain Loc --      Pain Edu? --      Excl. in GC? --    No data found.  Updated Vital Signs BP 110/73 (BP Location: Right Arm)   Pulse (!) 105   Temp 99 F (37.2 C) (Oral)   Resp 18   Ht 5' (1.524 m)  Wt 99.8 kg   SpO2 97%   BMI 42.97 kg/m   Visual Acuity Right Eye Distance:   Left Eye Distance:   Bilateral Distance:    Right Eye Near:   Left Eye Near:    Bilateral Near:     Physical Exam Vitals and nursing note reviewed.  Constitutional:      General: She is not in acute distress.    Appearance: Normal appearance. She is not ill-appearing, toxic-appearing or diaphoretic.  HENT:     Head: Normocephalic and atraumatic.     Nose: Nose normal.     Mouth/Throat:     Mouth: Mucous membranes are moist.  Eyes:     General: No scleral icterus.    Conjunctiva/sclera: Conjunctivae normal.     Pupils: Pupils are equal, round, and reactive to light.  Cardiovascular:     Rate and Rhythm: Normal rate and regular rhythm.     Pulses: Normal pulses.     Heart sounds: Normal heart sounds. No murmur heard.   No friction rub. No gallop.  Pulmonary:     Effort: Pulmonary effort is normal.     Breath sounds: Normal breath sounds. No stridor. No wheezing, rhonchi or rales.  Abdominal:     General: Bowel sounds are normal.     Palpations: Abdomen is soft. There is no hepatomegaly or splenomegaly.     Tenderness: There is no abdominal tenderness. There is left CVA tenderness. There is no guarding or rebound.  Musculoskeletal:     Cervical back: Normal range of motion and neck supple.  Skin:    General: Skin is warm and dry.     Capillary Refill: Capillary refill takes less than 2 seconds.  Neurological:     General: No focal deficit present.     Mental Status: She is alert and oriented to person, place, and time.     UC Treatments / Results   Labs (all labs ordered are listed, but only abnormal results are displayed) Labs Reviewed  POCT URINALYSIS DIP (DEVICE) - Abnormal; Notable for the following components:      Result Value   Protein, ur 100 (*)    All other components within normal limits  URINE CULTURE  POCT URINALYSIS DIPSTICK, ED / UC    EKG   Radiology No results found.  Procedures Procedures (including critical care time)  Medications Ordered in UC Medications - No data to display  Initial Impression / Assessment and Plan / UC Course  I have reviewed the triage vital signs and the nursing notes.  Pertinent labs & imaging results that were available during my care of the patient were reviewed by me and considered in my medical decision making (see chart for details).  Clinical impression: 1.  Left flank pain 2.  History of pyelonephritis recently treated with ciprofloxacin. 3.  History of nephrolithiasis being followed by urology.  Had stent placed back in January. 4.  Nausea without vomiting.  Treatment plan: 1.  The findings and treatment plan were discussed in detail with the patient.  Patient was in agreement. 2.  Recommended getting a UA.  Did not show any nitrites or leukocytes.  No active infection.  No blood.  Specific gravity was elevated at greater than 1.030 indicating she is not drinking of water. 3.  Prescribe some Zofran for nausea. 4.  Once again reiterated that she needs to drink plenty of fluids and flush her system. 5.  She needs to call her urologist and give  them an update and see what the next step is in her management plan. 6.  Over-the-counter meds as needed, Tylenol or Motrin for any fever or discomfort. 7.  Indicated that if her symptoms were to worsen or not improve that she needed to go to the ER for higher level of care including labs and advanced imaging.  She voiced verbal understanding. 8.  Discharged in stable condition will follow-up here as needed.    Final Clinical  Impressions(s) / UC Diagnoses   Final diagnoses:  Left flank pain  Nausea without vomiting  History of nephrolithiasis     Discharge Instructions      As we discussed, your urine does not show you have a urinary tract infection.  There is no blood either.  Does show that you are not drinking enough water.  I have recommended that you really push the fluids including water. I did prescribe some Zofran in case she do not have enough at home for your nausea. Recommend you follow-up with your primary care provider. Recommend you contact your urologist to see what the next steps are for this chronic condition. As we discussed, if your symptoms worsen you do need to go to the ER for higher level of care including labs and advanced imaging.     ED Prescriptions     Medication Sig Dispense Auth. Provider   ondansetron (ZOFRAN) 4 MG tablet Take 1 tablet (4 mg total) by mouth every 6 (six) hours. 12 tablet Delton See, MD      PDMP not reviewed this encounter.   Delton See, MD 03/25/21 [phone]

## 2021-03-25 NOTE — Discharge Instructions (Addendum)
As we discussed, your urine does not show you have a urinary tract infection.  There is no blood either.  Does show that you are not drinking enough water.  I have recommended that you really push the fluids including water. I did prescribe some Zofran in case she do not have enough at home for your nausea. Recommend you follow-up with your primary care provider. Recommend you contact your urologist to see what the next steps are for this chronic condition. As we discussed, if your symptoms worsen you do need to go to the ER for higher level of care including labs and advanced imaging.

## 2021-03-25 NOTE — ED Triage Notes (Signed)
Pt c/o bilateral flank pain, left greater than right, lower abdominal pain. Started last night. Denies dysuria, hematuria or urinary frequency. H/o kidney stone and kidney disease.

## 2021-03-25 NOTE — Telephone Encounter (Signed)
with her cMRI being normal, this is great news.  Please plate in 6 months.

## 2021-03-26 DIAGNOSIS — R1084 Generalized abdominal pain: Secondary | ICD-10-CM | POA: Diagnosis not present

## 2021-03-26 DIAGNOSIS — R1013 Epigastric pain: Secondary | ICD-10-CM | POA: Diagnosis not present

## 2021-03-26 DIAGNOSIS — R059 Cough, unspecified: Secondary | ICD-10-CM | POA: Diagnosis not present

## 2021-03-26 DIAGNOSIS — N200 Calculus of kidney: Secondary | ICD-10-CM | POA: Diagnosis not present

## 2021-03-26 DIAGNOSIS — R319 Hematuria, unspecified: Secondary | ICD-10-CM | POA: Diagnosis not present

## 2021-03-26 DIAGNOSIS — R509 Fever, unspecified: Secondary | ICD-10-CM | POA: Diagnosis not present

## 2021-03-26 DIAGNOSIS — R112 Nausea with vomiting, unspecified: Secondary | ICD-10-CM | POA: Diagnosis not present

## 2021-03-26 DIAGNOSIS — R1012 Left upper quadrant pain: Secondary | ICD-10-CM | POA: Diagnosis not present

## 2021-03-26 DIAGNOSIS — R300 Dysuria: Secondary | ICD-10-CM | POA: Diagnosis not present

## 2021-03-27 LAB — URINE CULTURE

## 2021-03-29 DIAGNOSIS — R112 Nausea with vomiting, unspecified: Secondary | ICD-10-CM | POA: Diagnosis not present

## 2021-03-29 DIAGNOSIS — U071 COVID-19: Secondary | ICD-10-CM | POA: Diagnosis not present

## 2021-03-29 DIAGNOSIS — R109 Unspecified abdominal pain: Secondary | ICD-10-CM | POA: Diagnosis not present

## 2021-04-05 DIAGNOSIS — N200 Calculus of kidney: Secondary | ICD-10-CM | POA: Diagnosis not present

## 2021-04-05 DIAGNOSIS — K589 Irritable bowel syndrome without diarrhea: Secondary | ICD-10-CM | POA: Diagnosis not present

## 2021-04-05 DIAGNOSIS — R319 Hematuria, unspecified: Secondary | ICD-10-CM | POA: Diagnosis not present

## 2021-04-05 DIAGNOSIS — R110 Nausea: Secondary | ICD-10-CM | POA: Diagnosis not present

## 2021-04-05 DIAGNOSIS — N29 Other disorders of kidney and ureter in diseases classified elsewhere: Secondary | ICD-10-CM | POA: Diagnosis not present

## 2021-04-05 DIAGNOSIS — R109 Unspecified abdominal pain: Secondary | ICD-10-CM | POA: Diagnosis not present

## 2021-04-23 ENCOUNTER — Other Ambulatory Visit: Payer: Self-pay

## 2021-04-23 ENCOUNTER — Ambulatory Visit
Admission: EM | Admit: 2021-04-23 | Discharge: 2021-04-23 | Disposition: A | Discharge status: Home / Self Care | Payer: Medicaid Other | Attending: Family Medicine | Admitting: Family Medicine

## 2021-04-23 ENCOUNTER — Encounter: Payer: Self-pay | Admitting: Emergency Medicine

## 2021-04-23 DIAGNOSIS — B372 Candidiasis of skin and nail: Secondary | ICD-10-CM | POA: Diagnosis not present

## 2021-04-23 MED ORDER — FLUCONAZOLE 150 MG PO TABS: 150.0000 mg | ORAL_TABLET | ORAL | 0 refills | Status: DC

## 2021-04-23 MED ORDER — CLOTRIMAZOLE 1 % EX CREA: TOPICAL_CREAM | CUTANEOUS | 0 refills | Status: DC

## 2021-04-23 NOTE — ED Triage Notes (Signed)
Patient c/o painful rash under her left breast that started this morning.

## 2021-04-23 NOTE — Discharge Instructions (Addendum)
Keep the area dry.  Medication as directed.  Take care  Dr. Adriana Simas

## 2021-04-24 NOTE — ED Provider Notes (Signed)
[facility]    CSN: [id number] Arrival date & time: 04/23/21  1827      History   Chief Complaint Chief Complaint  Patient presents with   Rash    Left breast    HPI  25 year old female presents with the above complaint.  Patient reports that she noticed a rash underneath her left breast this morning.  It is painful, 2/10 in severity.  She states that it is bright red.  She is concerned that she has a yeast infection in this area.  No fever.  No other associated symptoms.  No other complaints.  Past Medical History:  Diagnosis Date   ADHD    Patient Active Problem List   Diagnosis Date Noted   Takotsubo cardiomyopathy 01/01/2021   Acute systolic CHF (congestive heart failure) (HCC) 01/01/2021   Chest pain 12/31/2020   Pyelonephritis 12/31/2020   Diabetes mellitus without complication (HCC)    ADHD    Elevated troponin    Depression with anxiety    Kidney stones     Past Surgical History:  Procedure Laterality Date   CYST REMOVAL TRUNK  2019   GALLBLADDER SURGERY  2016   KIDNEY SURGERY     LEFT HEART CATH AND CORONARY ANGIOGRAPHY N/A 01/01/2021   Procedure: LEFT HEART CATH AND CORONARY ANGIOGRAPHY;  Surgeon: Alwyn Pea, MD;  Location: [facility];  Service: Cardiovascular;  Laterality: N/A;   OVARIAN CYST SURGERY  2011   TONSILLECTOMY      OB History   No obstetric history on file.      Home Medications    Prior to Admission medications   Medication Sig Start Date End Date Taking? Authorizing Provider  busPIRone (BUSPAR) 30 MG tablet Take 30 mg by mouth 2 (two) times daily. 10/18/19  Yes [provider]  clotrimazole (LOTRIMIN) 1 % cream Apply to affected area 2 times daily 04/23/21  Yes Kamry Faraci G, DO  CONCERTA 27 MG CR tablet Take 27 mg by mouth at bedtime. 09/29/20  Yes [provider]  fluconazole (DIFLUCAN) 150 MG tablet Take 1 tablet (150 mg total) by mouth once a week. Repeat dose in 72 hours. 04/23/21   Yes Adajah Cocking G, DO  FLUoxetine (PROZAC) 20 MG capsule Take 60 mg by mouth every morning. 12/08/20  Yes [provider]  levonorgestrel (MIRENA) 20 MCG/24HR IUD 1 each by Intrauterine route once.   Yes [provider]  ARIPiprazole (ABILIFY) 2 MG tablet Take 2 mg by mouth at bedtime. 05/25/20   [provider]  aspirin EC 81 MG EC tablet Take 1 tablet (81 mg total) by mouth daily. Swallow whole. 01/02/21   Marrion Coy, MD  atorvastatin (LIPITOR) 40 MG tablet Take 1 tablet (40 mg total) by mouth daily. 01/02/21   Marrion Coy, MD  diazepam (VALIUM) 5 MG tablet Take 1 tablet (5 mg) by mouth on arrival for MRI 01/14/21   Duke Salvia, MD  hyoscyamine (LEVSIN SL) 0.125 MG SL tablet Take 0.125 mg by mouth every 8 (eight) hours as needed. 10/27/20   [provider]  metoprolol succinate (TOPROL-XL) 50 MG 24 hr tablet Take 50 mg by mouth daily. 03/17/21   [provider]  ondansetron (ZOFRAN) 4 MG tablet Take 1 tablet (4 mg total) by mouth every 6 (six) hours. 03/25/21   Delton See, MD  oxybutynin (DITROPAN-XL) 10 MG 24 hr tablet Take 10 mg by mouth daily. 11/24/20   [provider]  propranolol (INDERAL) 20 MG tablet TAKE 1 TABLET(20 MG) BY MOUTH TWICE DAILY 05/19/20   [provider]  dicyclomine (BENTYL) 10 MG capsule  04/19/18 10/19/20  [provider]    Family History Family History  Problem Relation Age of Onset   Heart murmur Mother    Mitral valve prolapse Mother    Heart attack Father    Atrial fibrillation Maternal Grandfather    Stroke Paternal Grandmother    Mitral valve prolapse Paternal Grandmother     Social History Social History   Tobacco Use   Smoking status: Never   Smokeless tobacco: Never  Vaping Use   Vaping Use: Never used  Substance Use Topics   Alcohol use: No   Drug use: No     Allergies   Wound dressing adhesive and Latex   Review of Systems Review of Systems  Constitutional:  Negative.   Skin:  Positive for rash.    Physical Exam Triage Vital Signs ED Triage Vitals  Enc Vitals Group     BP 04/23/21 1845 116/84     Pulse Rate 04/23/21 1845 96     Resp 04/23/21 1845 14     Temp 04/23/21 1845 98.3 F (36.8 C)     Temp Source 04/23/21 1845 Oral     SpO2 04/23/21 1845 99 %     Weight 04/23/21 1842 210 lb (95.3 kg)     Height 04/23/21 1842 5' (1.524 m)     Head Circumference --      Peak Flow --      Pain Score 04/23/21 1842 2     Pain Loc --      Pain Edu? --      Excl. in GC? --    Updated Vital Signs BP 116/84 (BP Location: Right Arm)   Pulse 96   Temp 98.3 F (36.8 C) (Oral)   Resp 14   Ht 5' (1.524 m)   Wt 95.3 kg   SpO2 99%   BMI 41.01 kg/m   Visual Acuity Right Eye Distance:   Left Eye Distance:   Bilateral Distance:    Right Eye Near:   Left Eye Near:    Bilateral Near:     Physical Exam Vitals and nursing note reviewed. Exam conducted with a chaperone present.  Constitutional:      Appearance: Normal appearance. She is obese.  HENT:     Head: Normocephalic and atraumatic.  Cardiovascular:     Rate and Rhythm: Normal rate and regular rhythm.  Pulmonary:     Effort: Pulmonary effort is normal. No respiratory distress.     Breath sounds: Normal breath sounds.  Neurological:     Mental Status: She is alert.  Psychiatric:        Mood and Affect: Mood normal.        Behavior: Behavior normal.  Breast -underneath the left breast is a large area of erythema and irritation.  UC Treatments / Results  Labs (all labs ordered are listed, but only abnormal results are displayed) Labs Reviewed - No data to display  EKG   Radiology No results found.  Procedures Procedures (including critical care time)  Medications Ordered in UC Medications - No data to display  Initial Impression / Assessment and Plan / UC Course  I have reviewed the triage vital signs and the nursing notes.  Pertinent labs & imaging results that were  available during my care of the patient were reviewed by me and considered  in my medical decision making (see chart for details).    25 year old female presents with a rash underneath her left breast.  This appears to be a candidal skin infection/intertrigo.  Treating with Diflucan and clotrimazole.  Final Clinical Impressions(s) / UC Diagnoses   Final diagnoses:  Candidal skin infection     Discharge Instructions      Keep the area dry.  Medication as directed.  Take care  Dr. Adriana Simas    ED Prescriptions     Medication Sig Dispense Auth. Provider   fluconazole (DIFLUCAN) 150 MG tablet Take 1 tablet (150 mg total) by mouth once a week. Repeat dose in 72 hours. 2 tablet Sher Hellinger G, DO   clotrimazole (LOTRIMIN) 1 % cream Apply to affected area 2 times daily 28 g Tommie Sams, DO      PDMP not reviewed this encounter.   Tommie Sams, [place] 04/24/21 [phone]

## 2021-04-28 ENCOUNTER — Encounter: Payer: Self-pay | Admitting: Emergency Medicine

## 2021-04-28 ENCOUNTER — Other Ambulatory Visit: Payer: Self-pay

## 2021-04-28 ENCOUNTER — Ambulatory Visit
Admission: EM | Admit: 2021-04-28 | Discharge: 2021-04-28 | Disposition: A | Discharge status: Home / Self Care | Payer: Medicaid Other | Attending: Sports Medicine | Admitting: Sports Medicine

## 2021-04-28 DIAGNOSIS — R109 Unspecified abdominal pain: Secondary | ICD-10-CM | POA: Insufficient documentation

## 2021-04-28 HISTORY — DX: Calculus of kidney: N20.0

## 2021-04-28 LAB — POCT URINALYSIS DIP (DEVICE)
Bilirubin Urine: NEGATIVE
Glucose, UA: NEGATIVE mg/dL
Ketones, ur: NEGATIVE mg/dL
Nitrite: NEGATIVE
Protein, ur: NEGATIVE mg/dL
Specific Gravity, Urine: >=1.03 (ref 1.005–1.030)
Urobilinogen, UA: 0.2 mg/dL (ref 0.0–1.0)
pH: 6.5 (ref 5.0–8.0)

## 2021-04-28 MED ORDER — TAMSULOSIN HCL 0.4 MG PO CAPS: 0.4000 mg | ORAL_CAPSULE | Freq: Every day | ORAL | 0 refills | Status: DC

## 2021-04-28 MED ORDER — IBUPROFEN 600 MG PO TABS: 600.0000 mg | ORAL_TABLET | Freq: Four times a day (QID) | ORAL | 0 refills | Status: DC | PRN

## 2021-04-28 NOTE — ED Provider Notes (Signed)
[facility]    CSN: [id number] Arrival date & time: 04/28/21  1802      History   Chief Complaint Chief Complaint  Patient presents with   Flank Pain   Nausea    HPI Sandra Gibbs is a 25 y.o. female.   HPI  25 year old female here for evaluation of left flank pain.  Patient reports that she has had left flank pain for the last 3 days with urinary urgency and nausea.  She states that this is her common symptoms whenever she has a kidney stone.  Patient recently had a CT scan on March 26, 2021 that showed subcentimeter densities in her left kidney that were too small to characterize and a 2 mm nonobstructive stone in her right kidney.  Patient has been evaluated by urology and is awaiting on a referral to nephrology.  Past Medical History:  Diagnosis Date   ADHD    Kidney stones     Patient Active Problem List   Diagnosis Date Noted   Takotsubo cardiomyopathy 01/01/2021   Acute systolic CHF (congestive heart failure) (HCC) 01/01/2021   Chest pain 12/31/2020   Pyelonephritis 12/31/2020   Diabetes mellitus without complication (HCC)    ADHD    Elevated troponin    Depression with anxiety    Kidney stones     Past Surgical History:  Procedure Laterality Date   CYST REMOVAL TRUNK  2019   GALLBLADDER SURGERY  2016   KIDNEY SURGERY     LEFT HEART CATH AND CORONARY ANGIOGRAPHY N/A 01/01/2021   Procedure: LEFT HEART CATH AND CORONARY ANGIOGRAPHY;  Surgeon: Alwyn Pea, MD;  Location: [facility];  Service: Cardiovascular;  Laterality: N/A;   OVARIAN CYST SURGERY  2011   TONSILLECTOMY      OB History   No obstetric history on file.      Home Medications    Prior to Admission medications   Medication Sig Start Date End Date Taking? Authorizing Provider  ibuprofen (ADVIL) 600 MG tablet Take 1 tablet (600 mg total) by mouth every 6 (six) hours as needed. 04/28/21  Yes Becky Augusta, NP  tamsulosin (FLOMAX) 0.4 MG CAPS capsule Take 1  capsule (0.4 mg total) by mouth daily. 04/28/21  Yes Becky Augusta, NP  busPIRone (BUSPAR) 30 MG tablet Take 30 mg by mouth 2 (two) times daily. 10/18/19   [provider]  CONCERTA 27 MG CR tablet Take 27 mg by mouth at bedtime. 09/29/20   [provider]  fluconazole (DIFLUCAN) 150 MG tablet Take 1 tablet (150 mg total) by mouth once a week. Repeat dose in 72 hours. 04/23/21   Tommie Sams, DO  FLUoxetine (PROZAC) 20 MG capsule Take 60 mg by mouth every morning. 12/08/20   [provider]  levonorgestrel (MIRENA) 20 MCG/24HR IUD 1 each by Intrauterine route once.    [provider]  metoprolol succinate (TOPROL-XL) 50 MG 24 hr tablet Take 50 mg by mouth daily. 03/17/21   [provider]  ondansetron (ZOFRAN) 4 MG tablet Take 1 tablet (4 mg total) by mouth every 6 (six) hours. 03/25/21   Delton See, MD  dicyclomine (BENTYL) 10 MG capsule  04/19/18 10/19/20  [provider]    Family History Family History  Problem Relation Age of Onset   Heart murmur Mother    Mitral valve prolapse Mother    Heart attack Father    Atrial fibrillation Maternal Grandfather    Stroke Paternal Grandmother  Mitral valve prolapse Paternal Grandmother     Social History Social History   Tobacco Use   Smoking status: Never   Smokeless tobacco: Never  Vaping Use   Vaping Use: Never used  Substance Use Topics   Alcohol use: No   Drug use: No     Allergies   Wound dressing adhesive and Latex   Review of Systems Review of Systems  Constitutional:  Negative for fever.  Gastrointestinal:  Positive for nausea. Negative for abdominal pain.  Genitourinary:  Positive for flank pain and urgency. Negative for dysuria, frequency and hematuria.    Physical Exam Triage Vital Signs ED Triage Vitals  Enc Vitals Group     BP 04/28/21 1826 103/75     Pulse Rate 04/28/21 1826 90     Resp 04/28/21 1826 18     Temp 04/28/21 1826 98.3 F (36.8 C)     Temp  Source 04/28/21 1826 Oral     SpO2 04/28/21 1826 99 %     Weight --      Height --      Head Circumference --      Peak Flow --      Pain Score 04/28/21 1823 4     Pain Loc --      Pain Edu? --      Excl. in GC? --    No data found.  Updated Vital Signs BP 103/75 (BP Location: Right Arm)   Pulse 90   Temp 98.3 F (36.8 C) (Oral)   Resp 18   SpO2 99%   Visual Acuity Right Eye Distance:   Left Eye Distance:   Bilateral Distance:    Right Eye Near:   Left Eye Near:    Bilateral Near:     Physical Exam Vitals and nursing note reviewed.  Constitutional:      General: She is not in acute distress.    Appearance: Normal appearance. She is obese. She is not ill-appearing.  HENT:     Head: Normocephalic and atraumatic.  Cardiovascular:     Rate and Rhythm: Normal rate and regular rhythm.     Pulses: Normal pulses.     Heart sounds: Normal heart sounds. No murmur heard.   No gallop.  Pulmonary:     Effort: Pulmonary effort is normal.     Breath sounds: Normal breath sounds. No wheezing, rhonchi or rales.  Abdominal:     Tenderness: There is no right CVA tenderness or left CVA tenderness.  Skin:    General: Skin is warm and dry.     Capillary Refill: Capillary refill takes less than 2 seconds.  Neurological:     General: No focal deficit present.     Mental Status: She is alert and oriented to person, place, and time.  Psychiatric:        Mood and Affect: Mood normal.        Behavior: Behavior normal.        Thought Content: Thought content normal.        Judgment: Judgment normal.     UC Treatments / Results  Labs (all labs ordered are listed, but only abnormal results are displayed) Labs Reviewed  POCT URINALYSIS DIP (DEVICE) - Abnormal; Notable for the following components:      Result Value   Hgb urine dipstick TRACE (*)    Leukocytes,Ua TRACE (*)    All other components within normal limits  URINE CULTURE  POCT URINALYSIS DIPSTICK, ED / UC  EKG   Radiology No results found.  Procedures Procedures (including critical care time)  Medications Ordered in UC Medications - No data to display  Initial Impression / Assessment and Plan / UC Course  I have reviewed the triage vital signs and the nursing notes.  Pertinent labs & imaging results that were available during my care of the patient were reviewed by me and considered in my medical decision making (see chart for details).  Patient is a nontoxic-appearing 25 year old female with a longstanding history of urinary tract infections and renal stones here for evaluation of left flank pain this been going on for the last 3 days.  She is also had some nausea and urinary urgency but she denies pain with urination, abdominal pain, blood in her urine, or fever.  She did notice that this morning her urine was a little cloudier.  Patient's physical exam reveals a benign cardiopulmonary exam with clear lung sounds in all fields.  Abdomen is protuberant but soft.  No CVA tenderness on exam.  Urinalysis collected at triage shows trace hemoglobin and trace leukocytes but is nitrite negative and protein negative.  Specific gravity is >1.030.  Patient had a CT scan 1 month ago as outlined in the HPI.  I offered the patient a repeat CT scan to see if she had developed a kidney stone or to treat her for her discomfort and for potential kidney stone with tamsulosin.  Patient has elected to forego imaging and just undergo treatment.  She is in the process of being evaluated by nephrology and she is concerned about the number of CT scans and radiation dose that she has had so far.  I advised the patient that if she has any increase in her pain, gross hematuria, or fever she is to go the ER for evaluation and she verbalizes an understanding of same.  Patient discharged home on 60 mg ibuprofen every 6 hours as needed for pain and tamsulosin 0.4 mg.   Final Clinical Impressions(s) / UC Diagnoses    Final diagnoses:  Flank pain     Discharge Instructions      Take the Flomax 0.4 mg daily.  Take Ibuprofen 600 mg every 6 hours with food as needed for pain.  Follow-up with Nephrology as previously planned.  If you develop fever, gross blood in your urine, or increased pain you need to be evaluated in the ER>      ED Prescriptions     Medication Sig Dispense Auth. Provider   tamsulosin (FLOMAX) 0.4 MG CAPS capsule Take 1 capsule (0.4 mg total) by mouth daily. 30 capsule Becky Augusta, NP   ibuprofen (ADVIL) 600 MG tablet Take 1 tablet (600 mg total) by mouth every 6 (six) hours as needed. 30 tablet Becky Augusta, NP      PDMP not reviewed this encounter.   Becky Augusta, NP 04/28/21 1905

## 2021-04-28 NOTE — Discharge Instructions (Addendum)
Take the Flomax 0.4 mg daily.  Take Ibuprofen 600 mg every 6 hours with food as needed for pain.  Follow-up with Nephrology as previously planned.  If you develop fever, gross blood in your urine, or increased pain you need to be evaluated in the ER>

## 2021-04-28 NOTE — ED Triage Notes (Signed)
Pt presents today with c/o of left flank pain that began 3 days ago. +urine urgency +nausea

## 2021-04-30 LAB — URINE CULTURE: Culture: <10000 — AB

## 2021-06-11 ENCOUNTER — Ambulatory Visit
Admission: EM | Admit: 2021-06-11 | Discharge: 2021-06-11 | Disposition: A | Discharge status: Home / Self Care | Payer: Medicaid Other | Attending: Physician Assistant | Admitting: Physician Assistant

## 2021-06-11 ENCOUNTER — Other Ambulatory Visit: Payer: Self-pay

## 2021-06-11 DIAGNOSIS — J069 Acute upper respiratory infection, unspecified: Secondary | ICD-10-CM | POA: Diagnosis not present

## 2021-06-11 DIAGNOSIS — J029 Acute pharyngitis, unspecified: Secondary | ICD-10-CM

## 2021-06-11 LAB — POCT RAPID STREP A: Streptococcus, Group A Screen (Direct): NEGATIVE

## 2021-06-11 LAB — INFLUENZA A AND B ANTIGEN (CONVERTED LAB)
INFLUENZA A ANTIGEN, POC: NEGATIVE
INFLUENZA B ANTIGEN, POC: NEGATIVE

## 2021-06-11 MED ORDER — AMOXICILLIN 500 MG PO TABS: 500.0000 mg | ORAL_TABLET | Freq: Two times a day (BID) | ORAL | 0 refills | Status: AC

## 2021-06-11 NOTE — ED Provider Notes (Signed)
[facility]    CSN: [id number] Arrival date & time: 06/11/21  1729      History   Chief Complaint No chief complaint on file.   HPI Sandra Gibbs is a 25 y.o. female.   Patient is a 25 year old female who presents with complaint of stuffy nose, headache, body aches, and a nonproductive cough.  Patient denies any fevers but does report runny nose and congestion.  She denies chills, chest pain and shortness of breath.  She does report some nausea but no abdominal pain.  She is taken ibuprofen.  She reports she has had COVID vaccines but no booster.  She took a COVID test at home but was negative.  She reports her fianc has strep throat.  She is allergic to any medications.   Past Medical History:  Diagnosis Date   ADHD    Kidney stones     Patient Active Problem List   Diagnosis Date Noted   Takotsubo cardiomyopathy 01/01/2021   Acute systolic CHF (congestive heart failure) (HCC) 01/01/2021   Chest pain 12/31/2020   Pyelonephritis 12/31/2020   Diabetes mellitus without complication (HCC)    ADHD    Elevated troponin    Depression with anxiety    Kidney stones     Past Surgical History:  Procedure Laterality Date   CYST REMOVAL TRUNK  2019   GALLBLADDER SURGERY  2016   KIDNEY SURGERY     LEFT HEART CATH AND CORONARY ANGIOGRAPHY N/A 01/01/2021   Procedure: LEFT HEART CATH AND CORONARY ANGIOGRAPHY;  Surgeon: Alwyn Pea, MD;  Location: [facility];  Service: Cardiovascular;  Laterality: N/A;   OVARIAN CYST SURGERY  2011   TONSILLECTOMY      OB History   No obstetric history on file.      Home Medications    Prior to Admission medications   Medication Sig Start Date End Date Taking? Authorizing Provider  amoxicillin (AMOXIL) 500 MG tablet Take 1 tablet (500 mg total) by mouth 2 (two) times daily for 10 days. 06/11/21 06/21/21 Yes Candis Schatz, PA-C  busPIRone (BUSPAR) 30 MG tablet Take 30 mg by mouth 2 (two) times daily. 10/18/19   Yes [provider]  CONCERTA 27 MG CR tablet Take 27 mg by mouth at bedtime. 09/29/20  Yes [provider]  FLUoxetine (PROZAC) 20 MG capsule Take 60 mg by mouth every morning. 12/08/20  Yes [provider]  levonorgestrel (MIRENA) 20 MCG/24HR IUD 1 each by Intrauterine route once.   Yes [provider]  metoprolol succinate (TOPROL-XL) 50 MG 24 hr tablet Take 50 mg by mouth daily. 03/17/21  Yes [provider]  ibuprofen (ADVIL) 600 MG tablet Take 1 tablet (600 mg total) by mouth every 6 (six) hours as needed. 04/28/21   Becky Augusta, NP  ondansetron (ZOFRAN) 4 MG tablet Take 1 tablet (4 mg total) by mouth every 6 (six) hours. 03/25/21   Delton See, MD  dicyclomine (BENTYL) 10 MG capsule  04/19/18 10/19/20  [provider]    Family History Family History  Problem Relation Age of Onset   Heart murmur Mother    Mitral valve prolapse Mother    Heart attack Father    Atrial fibrillation Maternal Grandfather    Stroke Paternal Grandmother    Mitral valve prolapse Paternal Grandmother     Social History Social History   Tobacco Use   Smoking status: Never   Smokeless tobacco: Never  Vaping Use  Vaping Use: Never used  Substance Use Topics   Alcohol use: No   Drug use: No     Allergies   Wound dressing adhesive and Latex   Review of Systems Review of Systems as noted above in HPI.  Other systems reviewed and found to be negative   Physical Exam Triage Vital Signs ED Triage Vitals  Enc Vitals Group     BP 06/11/21 1750 130/89     Pulse Rate 06/11/21 1750 99     Resp 06/11/21 1750 20     Temp 06/11/21 1750 98.2 F (36.8 C)     Temp Source 06/11/21 1750 Oral     SpO2 06/11/21 1750 99 %     Weight 06/11/21 1747 230 lb (104.3 kg)     Height 06/11/21 1747 5' (1.524 m)     Head Circumference --      Peak Flow --      Pain Score 06/11/21 1747 0     Pain Loc --      Pain Edu? --      Excl. in GC? --    No data  found.  Updated Vital Signs BP 130/89 (BP Location: Left Arm)   Pulse 99   Temp 98.2 F (36.8 C) (Oral)   Resp 20   Ht 5' (1.524 m)   Wt 230 lb (104.3 kg)   SpO2 99%   BMI 44.92 kg/m    Physical Exam Constitutional:      General: She is not in acute distress.    Appearance: Normal appearance. She is not ill-appearing.  HENT:     Head: Normocephalic and atraumatic.     Right Ear: Ear canal normal. A middle ear effusion is present. Tympanic membrane is not erythematous.     Left Ear: Ear canal normal. A middle ear effusion is present. Tympanic membrane is erythematous.     Nose:     Right Sinus: No maxillary sinus tenderness or frontal sinus tenderness.     Left Sinus: No maxillary sinus tenderness or frontal sinus tenderness.     Mouth/Throat:     Mouth: Mucous membranes are moist.     Pharynx: Uvula midline. Posterior oropharyngeal erythema (mild) present.     Tonsils: No tonsillar exudate.  Cardiovascular:     Rate and Rhythm: Normal rate and regular rhythm.     Heart sounds: Normal heart sounds.  Pulmonary:     Effort: No respiratory distress.     Breath sounds: Normal breath sounds. No wheezing.  Abdominal:     General: Abdomen is flat.     Palpations: Abdomen is soft.  Neurological:     General: No focal deficit present.     Mental Status: She is alert and oriented to person, place, and time.     UC Treatments / Results  Labs (all labs ordered are listed, but only abnormal results are displayed) Labs Reviewed  CULTURE, GROUP A STREP [facility])  INFLUENZA A AND B ANTIGEN (CONVERTED LAB)  POCT RAPID STREP A, ED / UC  POC INFLUENZA A AND B ANTIGEN (URGENT CARE ONLY)  POCT RAPID STREP A    EKG   Radiology No results found.  Procedures Procedures (including critical care time)  Medications Ordered in UC Medications - No data to display  Initial Impression / Assessment and Plan / UC Course  I have reviewed the triage vital signs and the nursing  notes.  Pertinent labs & imaging results that were available during my  care of the patient were reviewed by me and considered in my medical decision making (see chart for details).     Patient presents with stuffy nose, headache, body aches, nonproductive cough, runny nose, and congestion along with nausea.  No chills, chest pain or shortness of breath.  Patient has been taking ibuprofen at home.  Home COVID test was negative.  Being after 5 PM, will send a rapid strep and rapid flu.  Rapid strep is negative.  Will send for culture.  Given fianc with strep throat, will go ahead and treat her.  We will go ahead and give her prescription for amoxicillin.  We can stop the antibiotics if culture negative  Final Clinical Impressions(s) / UC Diagnoses   Final diagnoses:  Sore throat  Upper respiratory tract infection, unspecified type     Discharge Instructions      -Rapid strep test was negative, we will send it for culture. Rapid flu is negative -Amoxicillin: 1 tablet twice a day for 10 days -Recommend Flonase, 2 sprays per nostril each morning.  Then can take Zyrtec, Claritin, or Allegra type medication at night.  Also can use a nasal saline rinse or Nettie pot to help flush out the congestion and improve fluid in the ears. -Push fluids -Ibuprofen or Tylenol as needed for pain -Follow-up with his clinic or primary care should symptoms worsen or not improve.     ED Prescriptions     Medication Sig Dispense Auth. Provider   amoxicillin (AMOXIL) 500 MG tablet Take 1 tablet (500 mg total) by mouth 2 (two) times daily for 10 days. 20 tablet Candis Schatz, PA-C      PDMP not reviewed this encounter.   Candis Schatz, PA-C 06/11/21 [phone]

## 2021-06-11 NOTE — ED Triage Notes (Signed)
Pt c/o runny nose, cough, headache and body aches since yesterday. She did take an at-home COVID test and it was negative. Pt denies f/v/d, does report occasional nausea. Pt's fiancee currently has Strep throat. Pt denies sore throat.

## 2021-06-11 NOTE — Discharge Instructions (Addendum)
-  Rapid strep test was negative, we will send it for culture. Rapid flu is negative -Amoxicillin: 1 tablet twice a day for 10 days -Recommend Flonase, 2 sprays per nostril each morning.  Then can take Zyrtec, Claritin, or Allegra type medication at night.  Also can use a nasal saline rinse or Nettie pot to help flush out the congestion and improve fluid in the ears. -Push fluids -Ibuprofen or Tylenol as needed for pain -Follow-up with his clinic or primary care should symptoms worsen or not improve.

## 2021-06-14 LAB — CULTURE, GROUP A STREP (THRC)

## 2021-06-15 DIAGNOSIS — H5213 Myopia, bilateral: Secondary | ICD-10-CM | POA: Diagnosis not present

## 2021-07-01 ENCOUNTER — Encounter: Payer: Self-pay | Admitting: Emergency Medicine

## 2021-07-01 ENCOUNTER — Ambulatory Visit (INDEPENDENT_AMBULATORY_CARE_PROVIDER_SITE_OTHER): Payer: Medicaid Other

## 2021-07-01 ENCOUNTER — Ambulatory Visit
Admission: EM | Admit: 2021-07-01 | Discharge: 2021-07-01 | Disposition: A | Discharge status: Home / Self Care | Payer: Medicaid Other | Attending: Emergency Medicine | Admitting: Emergency Medicine

## 2021-07-01 ENCOUNTER — Other Ambulatory Visit: Payer: Self-pay

## 2021-07-01 DIAGNOSIS — K5900 Constipation, unspecified: Secondary | ICD-10-CM

## 2021-07-01 DIAGNOSIS — N200 Calculus of kidney: Secondary | ICD-10-CM | POA: Diagnosis not present

## 2021-07-01 DIAGNOSIS — R109 Unspecified abdominal pain: Secondary | ICD-10-CM | POA: Diagnosis not present

## 2021-07-01 LAB — URINALYSIS, COMPLETE (UACMP) WITH MICROSCOPIC
Bilirubin Urine: NEGATIVE
Glucose, UA: NEGATIVE mg/dL
Hgb urine dipstick: NEGATIVE
Leukocytes,Ua: NEGATIVE
Nitrite: NEGATIVE
Protein, ur: NEGATIVE mg/dL
Specific Gravity, Urine: 1.025 (ref 1.005–1.030)
pH: 6 (ref 5.0–8.0)

## 2021-07-01 MED ORDER — ONDANSETRON 8 MG PO TBDP: 8.0000 mg | ORAL_TABLET | Freq: Three times a day (TID) | ORAL | 0 refills | Status: DC | PRN

## 2021-07-01 MED ORDER — TAMSULOSIN HCL 0.4 MG PO CAPS: 0.4000 mg | ORAL_CAPSULE | Freq: Every day | ORAL | 0 refills | Status: AC

## 2021-07-01 MED ORDER — IBUPROFEN 600 MG PO TABS: 600.0000 mg | ORAL_TABLET | Freq: Four times a day (QID) | ORAL | 0 refills | Status: DC | PRN

## 2021-07-01 MED ORDER — KETOROLAC TROMETHAMINE 60 MG/2ML IM SOLN
30.0000 mg | Freq: Once | INTRAMUSCULAR | Status: AC
  Administered 2021-07-01: 30 mg via INTRAMUSCULAR

## 2021-07-01 MED ORDER — ONDANSETRON 8 MG PO TBDP
8.0000 mg | ORAL_TABLET | Freq: Once | ORAL | Status: AC
  Administered 2021-07-01: 8 mg via ORAL

## 2021-07-01 MED ORDER — ACETAMINOPHEN 500 MG PO TABS
1000.0000 mg | ORAL_TABLET | Freq: Once | ORAL | Status: AC
  Administered 2021-07-01: 1000 mg via ORAL

## 2021-07-01 NOTE — ED Provider Notes (Signed)
HPI  SUBJECTIVE:  Sandra Gibbs is a 25 y.o. female who presents with 3 days of bilateral sharp, stabbing, flank pain that radiates to her back that lasts minutes accompanied with nausea, intermittent hematuria.  No vomiting, fevers, dysuria, urgency, frequency, cloudy or odorous urine.  She reports bladder pressure.  She had a normal bowel movement today.  No vaginal complaints.  No antibiotic in the past month or antipyretic in the past 6 hours.  She has been alternating Tylenol and 800 mg of ibuprofen without much improvement in her symptoms.  Symptoms are worse with lying down.  She states is identical to previous episodes of nephrolithiasis which she gets frequently.  She has a past medical history of bilateral medullary sponge kidney, pyelonephritis, UTI, and obstructing nephrolithiasis, Takotsubo cardiomyopathy, acute CHF, states that she is fully recovered, and IBS.  OVF:IEPPI, Sandra Gibbs, [place] [facility]: Dr. Jackie Gibbs at [facility].    Past Medical History:  Diagnosis Date   ADHD    Kidney stones     Past Surgical History:  Procedure Laterality Date   CYST REMOVAL TRUNK  2019   GALLBLADDER SURGERY  2016   KIDNEY SURGERY     LEFT HEART CATH AND CORONARY ANGIOGRAPHY N/A 01/01/2021   Procedure: LEFT HEART CATH AND CORONARY ANGIOGRAPHY;  Surgeon: Sandra Pea, MD;  Location: [facility] INVASIVE CV LAB;  Service: Cardiovascular;  Laterality: N/A;   OVARIAN CYST SURGERY  2011   TONSILLECTOMY      Family History  Problem Relation Age of Onset   Heart murmur Mother    Mitral valve prolapse Mother    Heart attack Father    Atrial fibrillation Maternal Grandfather    Stroke Paternal Grandmother    Mitral valve prolapse Paternal Grandmother     Social History   Tobacco Use   Smoking status: Never   Smokeless tobacco: Never  Vaping Use   Vaping Use: Never used  Substance Use Topics   Alcohol use: No   Drug use: No    No current facility-administered medications for this  encounter.  Current Outpatient Medications:    busPIRone (BUSPAR) 30 MG tablet, Take 30 mg by mouth 2 (two) times daily., Disp: , Rfl:    CONCERTA 27 MG CR tablet, Take 27 mg by mouth at bedtime., Disp: , Rfl:    FLUoxetine (PROZAC) 20 MG capsule, Take 60 mg by mouth every morning., Disp: , Rfl:    levonorgestrel (MIRENA) 20 MCG/24HR IUD, 1 each by Intrauterine route once., Disp: , Rfl:    metoprolol succinate (TOPROL-XL) 50 MG 24 hr tablet, Take 50 mg by mouth daily., Disp: , Rfl:    ondansetron (ZOFRAN ODT) 8 MG disintegrating tablet, Take 1 tablet (8 mg total) by mouth every 8 (eight) hours as needed for nausea or vomiting., Disp: 20 tablet, Rfl: 0   tamsulosin (FLOMAX) 0.4 MG CAPS capsule, Take 1 capsule (0.4 mg total) by mouth at bedtime for 7 days., Disp: 7 capsule, Rfl: 0   ibuprofen (ADVIL) 600 MG tablet, Take 1 tablet (600 mg total) by mouth every 6 (six) hours as needed., Disp: 30 tablet, Rfl: 0  Allergies  Allergen Reactions   Wound Dressing Adhesive Itching, Other (See Comments) and Rash    Other: "tears skin off" Other: "tears skin off" Other: "tears skin off"    Latex Itching     ROS  As noted in HPI.   Physical Exam  BP 103/72 (BP Location: Left Arm)   Pulse 91  Temp 97.7 F (36.5 C) (Oral)   Resp 18   Ht 5' (1.524 m)   Wt 104.3 kg   SpO2 97%   BMI 44.91 kg/m   Constitutional: Well developed, well nourished, appears slightly uncomfortable.  Appears nontoxic. Eyes:  EOMI, conjunctiva normal bilaterally HENT: Normocephalic, atraumatic,mucus membranes moist Respiratory: Normal inspiratory effort Cardiovascular: Normal rate GI: nondistended.  No suprapubic, flank tenderness. Back: Bilateral CVAT skin: No rash, skin intact Musculoskeletal: no deformities Neurologic: Alert & oriented x 3, no focal neuro deficits Psychiatric: Speech and behavior appropriate   ED Course   Medications  acetaminophen (TYLENOL) tablet 1,000 mg (1,000 mg Oral Given 07/01/21  1432)  ketorolac (TORADOL) injection 30 mg (30 mg Intramuscular Given 07/01/21 1433)  ondansetron (ZOFRAN-ODT) disintegrating tablet 8 mg (8 mg Oral Given 07/01/21 1433)    Orders Placed This Encounter  Procedures   DG Abd 1 View    Standing Status:   Standing    Number of Occurrences:   1    Order Specific Question:   Reason for Exam (SYMPTOM  OR DIAGNOSIS REQUIRED)    Answer:   bilateral flank pain r/o stone no ct available   Urinalysis, Complete w Microscopic Urine, Clean Catch    Standing Status:   Standing    Number of Occurrences:   1    Results for orders placed or performed during the hospital encounter of 07/01/21 (from the past 24 hour(s))  Urinalysis, Complete w Microscopic Urine, Clean Catch     Status: Abnormal   Collection Time: 07/01/21  2:02 PM  Result Value Ref Range   Color, Urine YELLOW YELLOW   APPearance HAZY (A) CLEAR   Specific Gravity, Urine 1.025 1.005 - 1.030   pH 6.0 5.0 - 8.0   Glucose, UA NEGATIVE NEGATIVE mg/dL   Hgb urine dipstick NEGATIVE NEGATIVE   Bilirubin Urine NEGATIVE NEGATIVE   Ketones, ur TRACE (A) NEGATIVE mg/dL   Protein, ur NEGATIVE NEGATIVE mg/dL   Nitrite NEGATIVE NEGATIVE   Leukocytes,Ua NEGATIVE NEGATIVE   Squamous Epithelial / LPF 11-20 0 - 5   WBC, UA 6-10 0 - 5 WBC/hpf   RBC / HPF 0-5 0 - 5 RBC/hpf   Bacteria, UA FEW (A) NONE SEEN   DG Abd 1 View  Result Date: 07/01/2021 CLINICAL DATA:  Bilateral flank pain.  Rule out stone. EXAM: ABDOMEN - 1 VIEW COMPARISON:  CT 06/24/2016 FINDINGS: Nonobstructive bowel gas pattern with moderate amount of stool in the colon. There is an IUD in the pelvis. Bowel gas and stool is overlying the kidneys but no large renal stones. Lung bases are clear. IMPRESSION: 1. No evidence for urinary calculi but limited evaluation of the kidneys due to overlying bowel gas and stool. 2. Moderate stool burden. Electronically Signed   By: Sandra Gibbs M.D.   On: 07/01/2021 15:03    ED Clinical Impression  1.  Flank pain   2. Nephrolithiasis      ED Assessment/Plan  Urine specimen has a few bacteria, but is contaminated.  No nitrites or esterase.  Will not send off for culture.  Notably, she does not have any blood, however, stones could be not moving right now.  Checking a KUB to rule out obstructing nephrolithiasis.  Discussed with patient that it does not pick up all stones, but CT is not available here today.  Plan to send home with Flomax, Zofran, Tylenol/ibuprofen.  Follow-up with urology ASAP.  ER return precautions given.  Patient states she feels  uncomfortable, giving Toradol, Tylenol, Zofran.  Reviewed imaging independently.  No nephrolithiasis noted.  Limited evaluation due to overlying bowel gas and stool.  Moderate stool burden.  See radiology report for full details.  On reevaluation, patient states that she feels somewhat better. Discussed labs, imaging, MDM, treatment plan, and plan for follow-up with patient.  Discussed sn/sx that should prompt return to the ED. patient agrees with plan. Work note.    Meds ordered this encounter  Medications   acetaminophen (TYLENOL) tablet 1,000 mg   ketorolac (TORADOL) injection 30 mg   ondansetron (ZOFRAN-ODT) disintegrating tablet 8 mg   ibuprofen (ADVIL) 600 MG tablet    Sig: Take 1 tablet (600 mg total) by mouth every 6 (six) hours as needed.    Dispense:  30 tablet    Refill:  0   ondansetron (ZOFRAN ODT) 8 MG disintegrating tablet    Sig: Take 1 tablet (8 mg total) by mouth every 8 (eight) hours as needed for nausea or vomiting.    Dispense:  20 tablet    Refill:  0   tamsulosin (FLOMAX) 0.4 MG CAPS capsule    Sig: Take 1 capsule (0.4 mg total) by mouth at bedtime for 7 days.    Dispense:  7 capsule    Refill:  0      *This clinic note was created using Scientist, clinical (histocompatibility and immunogenetics). Therefore, there may be occasional mistakes despite careful proofreading.  ?    Domenick Gong, MD 07/01/21 1512

## 2021-07-01 NOTE — ED Triage Notes (Signed)
Pt c/o bilateral flank pain, nausea. Started about 4 days ago. She states she has pressure in her bladder area when she urinated but no dysuria.

## 2021-07-01 NOTE — Discharge Instructions (Addendum)
Your KUB did not show any large radioopaque stones.  Try Flomax, continue pushing fluids, ibuprofen 600 mg combined with 1000 mg of Tylenol together 3-4 times a day as needed for pain.  Zofran for nausea and vomiting.

## 2021-07-05 DIAGNOSIS — H5213 Myopia, bilateral: Secondary | ICD-10-CM | POA: Diagnosis not present

## 2021-07-06 DIAGNOSIS — K760 Fatty (change of) liver, not elsewhere classified: Secondary | ICD-10-CM | POA: Diagnosis not present

## 2021-07-06 DIAGNOSIS — N200 Calculus of kidney: Secondary | ICD-10-CM | POA: Diagnosis not present

## 2021-07-06 DIAGNOSIS — R319 Hematuria, unspecified: Secondary | ICD-10-CM | POA: Diagnosis not present

## 2021-07-06 DIAGNOSIS — R110 Nausea: Secondary | ICD-10-CM | POA: Diagnosis not present

## 2021-07-06 DIAGNOSIS — R1012 Left upper quadrant pain: Secondary | ICD-10-CM | POA: Diagnosis not present

## 2021-07-09 DIAGNOSIS — M7918 Myalgia, other site: Secondary | ICD-10-CM | POA: Diagnosis not present

## 2021-07-09 DIAGNOSIS — R109 Unspecified abdominal pain: Secondary | ICD-10-CM | POA: Diagnosis not present

## 2021-07-13 DIAGNOSIS — Z79899 Other long term (current) drug therapy: Secondary | ICD-10-CM | POA: Diagnosis not present

## 2021-07-13 DIAGNOSIS — F411 Generalized anxiety disorder: Secondary | ICD-10-CM | POA: Diagnosis not present

## 2021-07-13 DIAGNOSIS — F331 Major depressive disorder, recurrent, moderate: Secondary | ICD-10-CM | POA: Diagnosis not present

## 2021-07-28 ENCOUNTER — Ambulatory Visit
Admission: EM | Admit: 2021-07-28 | Discharge: 2021-07-28 | Disposition: A | Discharge status: Home / Self Care | Payer: Medicaid Other | Attending: Medical Oncology | Admitting: Medical Oncology

## 2021-07-28 ENCOUNTER — Other Ambulatory Visit: Payer: Self-pay

## 2021-07-28 ENCOUNTER — Encounter: Payer: Self-pay | Admitting: Emergency Medicine

## 2021-07-28 DIAGNOSIS — R109 Unspecified abdominal pain: Secondary | ICD-10-CM

## 2021-07-28 DIAGNOSIS — Z87442 Personal history of urinary calculi: Secondary | ICD-10-CM

## 2021-07-28 LAB — URINALYSIS, COMPLETE (UACMP) WITH MICROSCOPIC
Bilirubin Urine: NEGATIVE
Glucose, UA: NEGATIVE mg/dL
Hgb urine dipstick: NEGATIVE
Ketones, ur: NEGATIVE mg/dL
Leukocytes,Ua: NEGATIVE
Nitrite: NEGATIVE
Protein, ur: NEGATIVE mg/dL
Specific Gravity, Urine: 1.02 (ref 1.005–1.030)
pH: 7 (ref 5.0–8.0)

## 2021-07-28 MED ORDER — KETOROLAC TROMETHAMINE 60 MG/2ML IM SOLN
60.0000 mg | Freq: Once | INTRAMUSCULAR | Status: AC
  Administered 2021-07-28: 60 mg via INTRAMUSCULAR

## 2021-07-28 MED ORDER — NITROFURANTOIN MONOHYD MACRO 100 MG PO CAPS: 100.0000 mg | ORAL_CAPSULE | Freq: Two times a day (BID) | ORAL | 0 refills | Status: DC

## 2021-07-28 MED ORDER — TAMSULOSIN HCL 0.4 MG PO CAPS
0.4000 mg | ORAL_CAPSULE | Freq: Every day | ORAL | 0 refills | Status: DC
Start: 2021-07-28 — End: 2021-11-20

## 2021-07-28 NOTE — ED Triage Notes (Signed)
PT reports left sided flank pain- started Sunday. Believes she passed a kidney stone Monday. Continued pain, low grade fever today.   Was seen in ED less than a  month ago and had CT that showed multiple bilateral stones.

## 2021-07-28 NOTE — ED Provider Notes (Signed)
[facility]    CSN: [id number] Arrival date & time: 07/28/21  1826      History   Chief Complaint Chief Complaint  Patient presents with   Flank Pain    HPI LENOX LADOUCEUR is a 25 y.o. female.   HPI  Flank Pain: Pt has history of many kidney stones. Pt reports that she has had left sided flank pain since Sunday. She believes that she passed a kidney stone on Monday but pain and a low grade brief fever re-occurred today. She reports that she was see less than a month ago and had a CT scan that showed multiple stones bilaterally. She denies intractable vomiting, severe pain, inability to void. She has taken NSAIDs for pain but has not taken anything within the last 6 hours. LMP: Has IUD   Past Medical History:  Diagnosis Date   ADHD    Kidney stones     Patient Active Problem List   Diagnosis Date Noted   Takotsubo cardiomyopathy 01/01/2021   Acute systolic CHF (congestive heart failure) (HCC) 01/01/2021   Chest pain 12/31/2020   Pyelonephritis 12/31/2020   Diabetes mellitus without complication (HCC)    ADHD    Elevated troponin    Depression with anxiety    Kidney stones     Past Surgical History:  Procedure Laterality Date   CYST REMOVAL TRUNK  2019   GALLBLADDER SURGERY  2016   KIDNEY SURGERY     LEFT HEART CATH AND CORONARY ANGIOGRAPHY N/A 01/01/2021   Procedure: LEFT HEART CATH AND CORONARY ANGIOGRAPHY;  Surgeon: Alwyn Pea, MD;  Location: [facility] INVASIVE CV LAB;  Service: Cardiovascular;  Laterality: N/A;   OVARIAN CYST SURGERY  2011   TONSILLECTOMY      OB History   No obstetric history on file.      Home Medications    Prior to Admission medications   Medication Sig Start Date End Date Taking? Authorizing Provider  busPIRone (BUSPAR) 30 MG tablet Take 30 mg by mouth 2 (two) times daily. 10/18/19  Yes [provider]  CONCERTA 27 MG CR tablet Take 27 mg by mouth at bedtime. 09/29/20  Yes [provider]   FLUoxetine (PROZAC) 20 MG capsule Take 60 mg by mouth every morning. 12/08/20  Yes [provider]  levonorgestrel (MIRENA) 20 MCG/24HR IUD 1 each by Intrauterine route once.   Yes [provider]  ibuprofen (ADVIL) 600 MG tablet Take 1 tablet (600 mg total) by mouth every 6 (six) hours as needed. 07/01/21   Domenick Gong, MD  metoprolol succinate (TOPROL-XL) 50 MG 24 hr tablet Take 50 mg by mouth daily. 03/17/21   [provider]  ondansetron (ZOFRAN ODT) 8 MG disintegrating tablet Take 1 tablet (8 mg total) by mouth every 8 (eight) hours as needed for nausea or vomiting. 07/01/21   Domenick Gong, MD  dicyclomine (BENTYL) 10 MG capsule  04/19/18 10/19/20  [provider]    Family History Family History  Problem Relation Age of Onset   Heart murmur Mother    Mitral valve prolapse Mother    Heart attack Father    Atrial fibrillation Maternal Grandfather    Stroke Paternal Grandmother    Mitral valve prolapse Paternal Grandmother     Social History Social History   Tobacco Use   Smoking status: Never   Smokeless tobacco: Never  Vaping Use   Vaping Use: Never used  Substance Use Topics   Alcohol use: No  Drug use: No     Allergies   Wound dressing adhesive and Latex   Review of Systems Review of Systems  As stated above in HPI Physical Exam Triage Vital Signs ED Triage Vitals  Enc Vitals Group     BP 07/28/21 1834 116/77     Pulse Rate 07/28/21 1834 80     Resp 07/28/21 1834 16     Temp 07/28/21 1834 98.6 F (37 C)     Temp Source 07/28/21 1834 Oral     SpO2 07/28/21 1834 97 %     Weight --      Height --      Head Circumference --      Peak Flow --      Pain Score 07/28/21 1832 5     Pain Loc --      Pain Edu? --      Excl. in GC? --    No data found.  Updated Vital Signs BP 116/77   Pulse 80   Temp 98.6 F (37 C) (Oral)   Resp 16   SpO2 97%   Physical Exam Vitals and nursing note reviewed.  Constitutional:       General: She is not in acute distress.    Appearance: Normal appearance. She is obese. She is not ill-appearing, toxic-appearing or diaphoretic.  HENT:     Head: Normocephalic and atraumatic.  Cardiovascular:     Rate and Rhythm: Normal rate and regular rhythm.     Heart sounds: Normal heart sounds.  Pulmonary:     Effort: Pulmonary effort is normal.     Breath sounds: Normal breath sounds.  Abdominal:     General: Bowel sounds are normal. There is no distension.     Palpations: Abdomen is soft. There is no mass.     Tenderness: There is no abdominal tenderness. There is right CVA tenderness (mild) and left CVA tenderness (mild). There is no guarding or rebound.     Hernia: No hernia is present.  Musculoskeletal:     Cervical back: Neck supple.  Lymphadenopathy:     Cervical: No cervical adenopathy.  Skin:    General: Skin is warm.     Coloration: Skin is not jaundiced or pale.  Neurological:     Mental Status: She is alert and oriented to person, place, and time.     UC Treatments / Results  Labs (all labs ordered are listed, but only abnormal results are displayed) Labs Reviewed  URINALYSIS, COMPLETE (UACMP) WITH MICROSCOPIC    EKG   Radiology No results found.  Procedures Procedures (including critical care time)  Medications Ordered in UC Medications - No data to display  Initial Impression / Assessment and Plan / UC Course  I have reviewed the triage vital signs and the nursing notes.  Pertinent labs & imaging results that were available during my care of the patient were reviewed by me and considered in my medical decision making (see chart for details).     New. Likely nephrolithiasis. She is able to void, UA does not suggest kidney stress and is not currently febrile so not likely to have obstruction or acute kidney injury. UA shows few bacteria without any other abnormality. Will start on ABX and send for culture to prevent UTI or pyelonephritis.  Toradol for pain. Should fever return or pain worsen she will go to ER.  Final Clinical Impressions(s) / UC Diagnoses   Final diagnoses:  None   Discharge Instructions  None    ED Prescriptions   None    PDMP not reviewed this encounter.   Rushie Chestnut, [place] 07/28/21 1913

## 2021-07-30 DIAGNOSIS — R300 Dysuria: Secondary | ICD-10-CM | POA: Diagnosis not present

## 2021-07-30 DIAGNOSIS — B9689 Other specified bacterial agents as the cause of diseases classified elsewhere: Secondary | ICD-10-CM | POA: Diagnosis not present

## 2021-07-30 DIAGNOSIS — Z20822 Contact with and (suspected) exposure to covid-19: Secondary | ICD-10-CM | POA: Diagnosis not present

## 2021-07-30 DIAGNOSIS — R509 Fever, unspecified: Secondary | ICD-10-CM | POA: Diagnosis not present

## 2021-07-30 DIAGNOSIS — N760 Acute vaginitis: Secondary | ICD-10-CM | POA: Diagnosis not present

## 2021-07-30 DIAGNOSIS — R1030 Lower abdominal pain, unspecified: Secondary | ICD-10-CM | POA: Diagnosis not present

## 2021-07-30 DIAGNOSIS — R110 Nausea: Secondary | ICD-10-CM | POA: Diagnosis not present

## 2021-07-30 LAB — URINE CULTURE

## 2021-07-31 ENCOUNTER — Telehealth: Payer: Self-pay

## 2021-07-31 NOTE — Telephone Encounter (Signed)
Transition Care Management Follow-up Telephone Call Date of discharge and from where: 07/30/2021 from [facility], [facility], [facility] How have you been since you were released from the hospital? Pt stated that she is still feeling bad but has been able to start the abx. Pt did not have any questions or concerns at this time.  Any questions or concerns? No  Items Reviewed: Did the pt receive and understand the discharge instructions provided? Yes  Medications obtained and verified? Yes  Other? No  Any new allergies since your discharge? No  Dietary orders reviewed? No Do you have support at home? Yes   Functional Questionnaire: (I = Independent and D = Dependent) ADLs: I  Bathing/Dressing- I  Meal Prep- I  Eating- I  Maintaining continence- I  Transferring/Ambulation- I  Managing Meds- I   Follow up appointments reviewed:  PCP Hospital f/u appt confirmed? No  Specialist Hospital f/u appt confirmed? No   Are transportation arrangements needed? No  If their condition worsens, is the pt aware to call PCP or go to the Emergency Dept.? Yes Was the patient provided with contact information for the PCP's office or ED? Yes Was to pt encouraged to call back with questions or concerns? Yes

## 2021-08-05 DIAGNOSIS — R102 Pelvic and perineal pain: Secondary | ICD-10-CM | POA: Diagnosis not present

## 2021-08-05 DIAGNOSIS — R109 Unspecified abdominal pain: Secondary | ICD-10-CM | POA: Diagnosis not present

## 2021-08-05 DIAGNOSIS — R110 Nausea: Secondary | ICD-10-CM | POA: Diagnosis not present

## 2021-08-05 DIAGNOSIS — Z8742 Personal history of other diseases of the female genital tract: Secondary | ICD-10-CM | POA: Diagnosis not present

## 2021-08-10 DIAGNOSIS — F331 Major depressive disorder, recurrent, moderate: Secondary | ICD-10-CM | POA: Diagnosis not present

## 2021-08-10 DIAGNOSIS — F411 Generalized anxiety disorder: Secondary | ICD-10-CM | POA: Diagnosis not present

## 2021-08-10 DIAGNOSIS — F900 Attention-deficit hyperactivity disorder, predominantly inattentive type: Secondary | ICD-10-CM | POA: Diagnosis not present

## 2021-08-14 DIAGNOSIS — R1031 Right lower quadrant pain: Secondary | ICD-10-CM | POA: Diagnosis not present

## 2021-08-14 DIAGNOSIS — R1033 Periumbilical pain: Secondary | ICD-10-CM | POA: Diagnosis not present

## 2021-08-14 DIAGNOSIS — R197 Diarrhea, unspecified: Secondary | ICD-10-CM | POA: Diagnosis not present

## 2021-08-14 DIAGNOSIS — R112 Nausea with vomiting, unspecified: Secondary | ICD-10-CM | POA: Diagnosis not present

## 2021-08-14 DIAGNOSIS — R1030 Lower abdominal pain, unspecified: Secondary | ICD-10-CM | POA: Diagnosis not present

## 2021-08-14 DIAGNOSIS — K760 Fatty (change of) liver, not elsewhere classified: Secondary | ICD-10-CM | POA: Diagnosis not present

## 2021-08-14 DIAGNOSIS — R7401 Elevation of levels of liver transaminase levels: Secondary | ICD-10-CM | POA: Diagnosis not present

## 2021-08-15 DIAGNOSIS — R1031 Right lower quadrant pain: Secondary | ICD-10-CM | POA: Diagnosis not present

## 2021-08-15 DIAGNOSIS — R1030 Lower abdominal pain, unspecified: Secondary | ICD-10-CM | POA: Diagnosis not present

## 2021-08-17 DIAGNOSIS — M7918 Myalgia, other site: Secondary | ICD-10-CM | POA: Diagnosis not present

## 2021-08-24 DIAGNOSIS — F411 Generalized anxiety disorder: Secondary | ICD-10-CM | POA: Diagnosis not present

## 2021-08-24 DIAGNOSIS — F331 Major depressive disorder, recurrent, moderate: Secondary | ICD-10-CM | POA: Diagnosis not present

## 2021-08-27 DIAGNOSIS — R102 Pelvic and perineal pain: Secondary | ICD-10-CM | POA: Diagnosis not present

## 2021-08-27 DIAGNOSIS — Z8742 Personal history of other diseases of the female genital tract: Secondary | ICD-10-CM | POA: Diagnosis not present

## 2021-09-16 ENCOUNTER — Telehealth: Payer: Self-pay | Admitting: Family Medicine

## 2021-09-16 NOTE — Telephone Encounter (Signed)
..   Medicaid Managed Care   Unsuccessful Outreach Note  09/16/2021 Name: Sandra Gibbs MRN: [id number] DOB: 08/29/1996  Referred by: Cathie Hoops, PA Reason for referral : High Risk Managed Medicaid (I called the patient today to get her scheduled with the MM team. I left my name and number on her VM.)   An unsuccessful telephone outreach was attempted today. The patient was referred to the case management team for assistance with care management and care coordination.   Follow Up Plan: The care management team will reach out to the patient again over the next 14 days.   Weston Settle Care Guide, High Risk Medicaid Managed Care Embedded Care Coordination [facility]  [facility]

## 2021-09-28 DIAGNOSIS — R319 Hematuria, unspecified: Secondary | ICD-10-CM | POA: Diagnosis not present

## 2021-09-28 DIAGNOSIS — N390 Urinary tract infection, site not specified: Secondary | ICD-10-CM | POA: Diagnosis not present

## 2021-09-28 DIAGNOSIS — N200 Calculus of kidney: Secondary | ICD-10-CM | POA: Diagnosis not present

## 2021-10-06 DIAGNOSIS — R300 Dysuria: Secondary | ICD-10-CM | POA: Insufficient documentation

## 2021-10-06 DIAGNOSIS — L739 Follicular disorder, unspecified: Secondary | ICD-10-CM | POA: Diagnosis not present

## 2021-10-06 DIAGNOSIS — N898 Other specified noninflammatory disorders of vagina: Secondary | ICD-10-CM | POA: Diagnosis not present

## 2021-10-06 DIAGNOSIS — R1110 Vomiting, unspecified: Secondary | ICD-10-CM | POA: Insufficient documentation

## 2021-10-06 DIAGNOSIS — R109 Unspecified abdominal pain: Secondary | ICD-10-CM | POA: Diagnosis not present

## 2021-10-07 ENCOUNTER — Ambulatory Visit: Payer: Medicaid Other | Admitting: Internal Medicine

## 2021-10-07 DIAGNOSIS — B9689 Other specified bacterial agents as the cause of diseases classified elsewhere: Secondary | ICD-10-CM | POA: Diagnosis not present

## 2021-10-07 DIAGNOSIS — N29 Other disorders of kidney and ureter in diseases classified elsewhere: Secondary | ICD-10-CM | POA: Diagnosis not present

## 2021-10-07 DIAGNOSIS — N200 Calculus of kidney: Secondary | ICD-10-CM | POA: Diagnosis not present

## 2021-10-07 DIAGNOSIS — N760 Acute vaginitis: Secondary | ICD-10-CM | POA: Diagnosis not present

## 2021-10-07 DIAGNOSIS — N3941 Urge incontinence: Secondary | ICD-10-CM | POA: Diagnosis not present

## 2021-10-08 ENCOUNTER — Encounter: Payer: Self-pay | Admitting: Internal Medicine

## 2021-10-12 DIAGNOSIS — R319 Hematuria, unspecified: Secondary | ICD-10-CM | POA: Diagnosis not present

## 2021-10-14 DIAGNOSIS — Z30431 Encounter for routine checking of intrauterine contraceptive device: Secondary | ICD-10-CM | POA: Diagnosis not present

## 2021-11-20 ENCOUNTER — Ambulatory Visit
Admission: EM | Admit: 2021-11-20 | Discharge: 2021-11-20 | Disposition: A | Discharge status: Home / Self Care | Payer: Medicaid Other | Attending: Emergency Medicine | Admitting: Emergency Medicine

## 2021-11-20 ENCOUNTER — Other Ambulatory Visit: Payer: Self-pay

## 2021-11-20 ENCOUNTER — Emergency Department
Admission: EM | Admit: 2021-11-20 | Discharge: 2021-11-20 | Disposition: A | Discharge status: Home / Self Care | Payer: Medicaid Other | Attending: Emergency Medicine | Admitting: Emergency Medicine

## 2021-11-20 ENCOUNTER — Encounter: Payer: Self-pay | Admitting: Gynecology

## 2021-11-20 DIAGNOSIS — N939 Abnormal uterine and vaginal bleeding, unspecified: Secondary | ICD-10-CM | POA: Insufficient documentation

## 2021-11-20 DIAGNOSIS — I509 Heart failure, unspecified: Secondary | ICD-10-CM | POA: Insufficient documentation

## 2021-11-20 DIAGNOSIS — R1030 Lower abdominal pain, unspecified: Secondary | ICD-10-CM | POA: Diagnosis not present

## 2021-11-20 DIAGNOSIS — E119 Type 2 diabetes mellitus without complications: Secondary | ICD-10-CM | POA: Insufficient documentation

## 2021-11-20 DIAGNOSIS — R1031 Right lower quadrant pain: Secondary | ICD-10-CM | POA: Diagnosis not present

## 2021-11-20 LAB — CBC WITH DIFFERENTIAL/PLATELET
Abs Immature Granulocytes: 0.03 10*3/uL (ref 0.00–0.07)
Basophils Absolute: 0.1 10*3/uL (ref 0.0–0.1)
Basophils Relative: 1 %
Eosinophils Absolute: 0.1 10*3/uL (ref 0.0–0.5)
Eosinophils Relative: 1 %
HCT: 43.5 % (ref 36.0–46.0)
Hemoglobin: 15 g/dL (ref 12.0–15.0)
Immature Granulocytes: 0 %
Lymphocytes Relative: 44 %
Lymphs Abs: 4 10*3/uL (ref 0.7–4.0)
MCH: 29.4 pg (ref 26.0–34.0)
MCHC: 34.5 g/dL (ref 30.0–36.0)
MCV: 85.1 fL (ref 80.0–100.0)
Monocytes Absolute: 0.7 10*3/uL (ref 0.1–1.0)
Monocytes Relative: 8 %
Neutro Abs: 4.2 10*3/uL (ref 1.7–7.7)
Neutrophils Relative %: 46 %
Platelets: 416 10*3/uL — ABNORMAL HIGH (ref 150–400)
RBC: 5.11 MIL/uL (ref 3.87–5.11)
RDW: 12.3 % (ref 11.5–15.5)
WBC: 9 10*3/uL (ref 4.0–10.5)
nRBC: 0 % (ref 0.0–0.2)

## 2021-11-20 LAB — URINALYSIS, ROUTINE W REFLEX MICROSCOPIC
Bilirubin Urine: NEGATIVE
Glucose, UA: NEGATIVE mg/dL
Ketones, ur: NEGATIVE mg/dL
Leukocytes,Ua: NEGATIVE
Nitrite: NEGATIVE
Specific Gravity, Urine: >1.03 — ABNORMAL HIGH (ref 1.005–1.030)
pH: 5.5 (ref 5.0–8.0)

## 2021-11-20 LAB — URINALYSIS, MICROSCOPIC (REFLEX): RBC / HPF: >50 RBC/hpf (ref 0–5)

## 2021-11-20 LAB — BASIC METABOLIC PANEL
Anion gap: 10 (ref 5–15)
BUN: 13 mg/dL (ref 6–20)
CO2: 26 mmol/L (ref 22–32)
Calcium: 9.2 mg/dL (ref 8.9–10.3)
Chloride: 97 mmol/L — ABNORMAL LOW (ref 98–111)
Creatinine, Ser: 0.72 mg/dL (ref 0.44–1.00)
GFR, Estimated: >60 mL/min (ref >60)
Glucose, Bld: 91 mg/dL (ref 70–99)
Potassium: 3.8 mmol/L (ref 3.5–5.1)
Sodium: 133 mmol/L — ABNORMAL LOW (ref 135–145)

## 2021-11-20 LAB — WET PREP, GENITAL
Clue Cells Wet Prep HPF POC: NONE SEEN
Sperm: NONE SEEN
Trich, Wet Prep: NONE SEEN
WBC, Wet Prep HPF POC: <10 — AB (ref <10)
Yeast Wet Prep HPF POC: NONE SEEN

## 2021-11-20 LAB — PREGNANCY, URINE: Preg Test, Ur: NEGATIVE

## 2021-11-20 LAB — HEMOGLOBIN AND HEMATOCRIT, BLOOD
HCT: 41.9 % (ref 36.0–46.0)
Hemoglobin: 14.1 g/dL (ref 12.0–15.0)

## 2021-11-20 MED ORDER — ONDANSETRON 8 MG PO TBDP
8.0000 mg | ORAL_TABLET | Freq: Once | ORAL | Status: AC
  Administered 2021-11-20: 8 mg via ORAL

## 2021-11-20 MED ORDER — KETOROLAC TROMETHAMINE 60 MG/2ML IM SOLN
30.0000 mg | Freq: Once | INTRAMUSCULAR | Status: AC
  Administered 2021-11-20: 30 mg via INTRAMUSCULAR

## 2021-11-20 MED ORDER — ACETAMINOPHEN 500 MG PO TABS
1000.0000 mg | ORAL_TABLET | Freq: Once | ORAL | Status: AC
  Administered 2021-11-20: 1000 mg via ORAL

## 2021-11-20 NOTE — ED Provider Notes (Signed)
HPI  SUBJECTIVE:  Sandra Gibbs is a 26 y.o. female who presents with severe, constant, lower abdominal cramping that radiates to her back for the past 4 days, and heavy vaginal bleeding starting yesterday.  She reports fevers to 100.0, nausea, vomiting, dizziness and lightheadedness.  No chest pain, shortness of breath, syncope.  She states that she is going through 3 pads per hour.  She had an IUD removed 4 weeks ago and had 5 days of vaginal bleeding after that, but and was fine up until 4 days ago.  She has no urinary complaints.  No vaginal odor.  She is in a long-term monogamous relationship with a female, who is asymptomatic.  STDs are not a concern today.  She states that the car ride over here was painful.  She has tried ibuprofen, Toradol and an unknown muscle relaxant without improvement in her symptoms.  Symptoms are worse with movement.  She has a past medical history of UTI, pyelonephritis, nephrolithiasis, BV, yeast, IBS.  No history of PID, STDs, coagulopathy, ectopic pregnancy, fibroids, endometriosis.  Last normal LMP: 1.5 years ago before IUD insertion.    Past Medical History:  Diagnosis Date   ADHD    Kidney stones     Past Surgical History:  Procedure Laterality Date   CYST REMOVAL TRUNK  2019   GALLBLADDER SURGERY  2016   KIDNEY SURGERY     LEFT HEART CATH AND CORONARY ANGIOGRAPHY N/A 01/01/2021   Procedure: LEFT HEART CATH AND CORONARY ANGIOGRAPHY;  Surgeon: Yolonda Kida, MD;  Location: [facility];  Service: Cardiovascular;  Laterality: N/A;   OVARIAN CYST SURGERY  2011   TONSILLECTOMY      Family History  Problem Relation Age of Onset   Heart murmur Mother    Mitral valve prolapse Mother    Heart attack Father    Atrial fibrillation Maternal Grandfather    Stroke Paternal Grandmother    Mitral valve prolapse Paternal Grandmother     Social History   Tobacco Use   Smoking status: Never   Smokeless tobacco: Never  Vaping Use   Vaping  Use: Never used  Substance Use Topics   Alcohol use: No   Drug use: No    No current facility-administered medications for this encounter.  Current Outpatient Medications:    busPIRone (BUSPAR) 30 MG tablet, Take 30 mg by mouth 2 (two) times daily., Disp: , Rfl:    CONCERTA 27 MG CR tablet, Take 27 mg by mouth at bedtime., Disp: , Rfl:    FLUoxetine (PROZAC) 40 MG capsule, Take 40 mg by mouth 2 (two) times daily., Disp: , Rfl:    ketorolac (TORADOL) 10 MG tablet, Take 10 mg by mouth 3 (three) times daily., Disp: , Rfl:    FLUoxetine (PROZAC) 20 MG capsule, Take 60 mg by mouth every morning., Disp: , Rfl:    metFORMIN (GLUCOPHAGE) 500 MG tablet, Take 1 tablet by mouth daily before breakfast., Disp: , Rfl:    metoprolol succinate (TOPROL-XL) 50 MG 24 hr tablet, Take 50 mg by mouth daily., Disp: , Rfl:    nitrofurantoin, macrocrystal-monohydrate, (MACROBID) 100 MG capsule, Take 1 capsule (100 mg total) by mouth 2 (two) times daily., Disp: 10 capsule, Rfl: 0  Allergies  Allergen Reactions   Wound Dressing Adhesive Itching, Other (See Comments) and Rash    Other: "tears skin off" Other: "tears skin off" Other: "tears skin off"    Latex Itching   Morphine Itching and Rash  ROS  As noted in HPI.   Physical Exam  BP 118/86 (BP Location: Left Arm)    Pulse 77    Temp 98.2 F (36.8 C) (Oral)    Resp 16    Ht 5' (1.524 m)    Wt 104.3 kg    LMP 11/19/2021    SpO2 100%    BMI 44.92 kg/m   Constitutional: Well developed, well nourished, appears uncomfortable. Eyes:  EOMI, conjunctiva normal bilaterally HENT: Normocephalic, atraumatic,mucus membranes moist Respiratory: Normal inspiratory effort Cardiovascular: Normal rate GI: nondistended soft,.  Diffuse lower abdominal tenderness, maximal in the suprapubic region. back: Left CVA tenderness, patient states this is not new for her. GU: Normal external genitalia.  No odor.  Os normal.  Active copious bright red bleeding from os.   Positive right adnexal tenderness.  Uterine tenderness.  No CMT.  Chaperone present during exam skin: No rash, skin intact Musculoskeletal: no deformities Neurologic: Alert & oriented x 3, no focal neuro deficits Psychiatric: Speech and behavior appropriate   ED Course   Medications  acetaminophen (TYLENOL) tablet 1,000 mg (1,000 mg Oral Given 11/20/21 1206)  ketorolac (TORADOL) injection 30 mg (30 mg Intramuscular Given 11/20/21 1300)  ondansetron (ZOFRAN-ODT) disintegrating tablet 8 mg (8 mg Oral Given 11/20/21 1259)    Orders Placed This Encounter  Procedures   Pelvic exam    Standing Status:   Standing    Number of Occurrences:   1   Wet prep, genital    Standing Status:   Standing    Number of Occurrences:   1   CBC with Differential    Standing Status:   Standing    Number of Occurrences:   1   Basic metabolic panel    Standing Status:   Standing    Number of Occurrences:   1   Pregnancy, urine    Standing Status:   Standing    Number of Occurrences:   1   Urinalysis, Routine w reflex microscopic    Standing Status:   Standing    Number of Occurrences:   1   Urinalysis, Microscopic (reflex)    Standing Status:   Standing    Number of Occurrences:   1    Results for orders placed or performed during the hospital encounter of 11/20/21 (from the past 24 hour(s))  Pregnancy, urine     Status: None   Collection Time: 11/20/21 12:06 PM  Result Value Ref Range   Preg Test, Ur NEGATIVE NEGATIVE  Urinalysis, Routine w reflex microscopic     Status: Abnormal   Collection Time: 11/20/21 12:06 PM  Result Value Ref Range   Color, Urine YELLOW YELLOW   APPearance HAZY (A) CLEAR   Specific Gravity, Urine >1.030 (H) 1.005 - 1.030   pH 5.5 5.0 - 8.0   Glucose, UA NEGATIVE NEGATIVE mg/dL   Hgb urine dipstick LARGE (A) NEGATIVE   Bilirubin Urine NEGATIVE NEGATIVE   Ketones, ur NEGATIVE NEGATIVE mg/dL   Protein, ur TRACE (A) NEGATIVE mg/dL   Nitrite NEGATIVE NEGATIVE    Leukocytes,Ua NEGATIVE NEGATIVE  Urinalysis, Microscopic (reflex)     Status: Abnormal   Collection Time: 11/20/21 12:06 PM  Result Value Ref Range   RBC / HPF >50 0 - 5 RBC/hpf   WBC, UA 0-5 0 - 5 WBC/hpf   Bacteria, UA FEW (A) NONE SEEN   Squamous Epithelial / LPF 0-5 0 - 5  CBC with Differential     Status: Abnormal  Collection Time: 11/20/21 12:14 PM  Result Value Ref Range   WBC 9.0 4.0 - 10.5 K/uL   RBC 5.11 3.87 - 5.11 MIL/uL   Hemoglobin 15.0 12.0 - 15.0 g/dL   HCT 43.5 36.0 - 46.0 %   MCV 85.1 80.0 - 100.0 fL   MCH 29.4 26.0 - 34.0 pg   MCHC 34.5 30.0 - 36.0 g/dL   RDW 12.3 11.5 - 15.5 %   Platelets 416 (H) 150 - 400 K/uL   nRBC 0.0 0.0 - 0.2 %   Neutrophils Relative % 46 %   Neutro Abs 4.2 1.7 - 7.7 K/uL   Lymphocytes Relative 44 %   Lymphs Abs 4.0 0.7 - 4.0 K/uL   Monocytes Relative 8 %   Monocytes Absolute 0.7 0.1 - 1.0 K/uL   Eosinophils Relative 1 %   Eosinophils Absolute 0.1 0.0 - 0.5 K/uL   Basophils Relative 1 %   Basophils Absolute 0.1 0.0 - 0.1 K/uL   Immature Granulocytes 0 %   Abs Immature Granulocytes 0.03 0.00 - 0.07 K/uL  Basic metabolic panel     Status: Abnormal   Collection Time: 11/20/21 12:14 PM  Result Value Ref Range   Sodium 133 (L) 135 - 145 mmol/L   Potassium 3.8 3.5 - 5.1 mmol/L   Chloride 97 (L) 98 - 111 mmol/L   CO2 26 22 - 32 mmol/L   Glucose, Bld 91 70 - 99 mg/dL   BUN 13 6 - 20 mg/dL   Creatinine, Ser 0.72 0.44 - 1.00 mg/dL   Calcium 9.2 8.9 - 10.3 mg/dL   GFR, Estimated >60 >60 mL/min   Anion gap 10 5 - 15  Wet prep, genital     Status: Abnormal   Collection Time: 11/20/21 12:14 PM  Result Value Ref Range   Yeast Wet Prep HPF POC NONE SEEN NONE SEEN   Trich, Wet Prep NONE SEEN NONE SEEN   Clue Cells Wet Prep HPF POC NONE SEEN NONE SEEN   WBC, Wet Prep HPF POC <10 (A) <10   Sperm NONE SEEN    No results found.  ED Clinical Impression  1. Abnormal vaginal bleeding    ED Assessment/Plan  Patient with unexpected  heavy vaginal bleeding, abdominal pain and is worse than usual, fevers, nausea, vomiting and dizziness. Checking UA, urine pregnancy, CBC, BMP and wet prep.  Giving Zofran, Toradol, Tylenol.   Patient is not pregnant, she does not have a UTI which to explain her fever and/or severe abdominal pain.  Hemoglobin is higher than usual, however, concern that with going through 3 pads per hour, she could rapidly drop her hemoglobin.  I am not sure that medication alone would stop it.  We discussed trying outpatient management versus going to the ED for further evaluation and possible OB consultation, and she has decided to go to the emergency department.  She is stable to go by private vehicle.  Gave 30 mg Toradol, 1000 mg of Tylenol, 8 mg of Zofran.  Gust labs, medical decision making, rationale for transfer to the emergency department.  She agrees to go.  Meds ordered this encounter  Medications   acetaminophen (TYLENOL) tablet 1,000 mg   ketorolac (TORADOL) injection 30 mg   ondansetron (ZOFRAN-ODT) disintegrating tablet 8 mg    *This clinic note was created using [employer]. Therefore, there may be occasional mistakes despite careful proofreading.  ?     Melynda Ripple, MD 11/20/21 1309

## 2021-11-20 NOTE — ED Provider Notes (Signed)
[facility] Provider Note    Event Date/Time   First MD Initiated Contact with Patient 11/20/21 1452     (approximate)   History   Chief Complaint Vaginal Bleeding  HPI  Sandra Gibbs is a 26 y.o. female with past medical history of diabetes, Takotsubo's cardiomyopathy, and CHF who presents to the ED complaining of vaginal bleeding.  Patient reports that she has had about 3 days of constant vaginal bleeding, causing her to have to change a pad about twice an hour.  She states that bleeding has been constant in severity since onset and she has had occasional crampy pain in her lower abdomen.  She had an IUD removed at [facility] a couple of weeks ago, reports light bleeding after this but that had stopped on its own.  She denies any fevers, nausea, vomiting, dysuria, hematuria, or flank pain.  She was initially evaluated at urgent care, where pregnancy testing is negative and labs including hemoglobin were unremarkable, however due to severity of bleeding was referred to the ED for further evaluation.     Physical Exam   Triage Vital Signs: ED Triage Vitals  Enc Vitals Group     BP 11/20/21 1400 115/83     Pulse Rate 11/20/21 1400 61     Resp 11/20/21 1400 15     Temp 11/20/21 1400 97.8 F (36.6 C)     Temp Source 11/20/21 1400 Oral     SpO2 11/20/21 1400 99 %     Weight --      Height --      Head Circumference --      Peak Flow --      Pain Score 11/20/21 1358 7     Pain Loc --      Pain Edu? --      Excl. in [place]? --     Most recent vital signs: Vitals:   11/20/21 1400  BP: 115/83  Pulse: 61  Resp: 15  Temp: 97.8 F (36.6 C)  SpO2: 99%    Constitutional: Alert and oriented. Eyes: Conjunctivae are normal. Head: Atraumatic. Nose: No congestion/rhinnorhea. Mouth/Throat: Mucous membranes are moist.  Cardiovascular: Normal rate, regular rhythm. Grossly normal heart sounds.  2+ radial pulses bilaterally. Respiratory: Normal  respiratory effort.  No retractions. Lungs CTAB. Gastrointestinal: Soft and nontender. No distention. Genitourinary: Deferred. Musculoskeletal: No lower extremity tenderness nor edema.  Neurologic:  Normal speech and language. No gross focal neurologic deficits are appreciated.    ED Results / Procedures / Treatments   Labs (all labs ordered are listed, but only abnormal results are displayed) Labs Reviewed  HEMOGLOBIN AND HEMATOCRIT, BLOOD     PROCEDURES:  Critical Care performed: No  Procedures   MEDICATIONS ORDERED IN ED: Medications - No data to display   IMPRESSION / MDM / [place] / ED COURSE  I reviewed the triage vital signs and the nursing notes.                              26 y.o. female with past history of diabetes, Takotsubo's cardiomyopathy, and CHF who presents to the ED complaining of about 3 days of persistent heavy vaginal bleeding associated with lower abdominal discomfort.  Differential diagnosis includes, but is not limited to, ectopic pregnancy, dysfunctional uterine bleeding, fibroids, endometriosis.  Patient is well-appearing and in no acute distress, vital signs are stable and hemoglobin was within normal limits  during her urgent care visit.  Additional labs from urgent care visit were reviewed and reassuring with no AKI or electrolyte abnormality.  Pregnancy testing at urgent care was also negative.  Repeat hemoglobin is trending down very slightly, less than one-point.  Case discussed with Dr. Marcelline Mates of OB/GYN, who states that patient may follow-up outpatient for ultrasound and further assessment.  Dr. Marcelline Mates recommends starting patient on birth control taper, however patient adamantly declines, states she has had numerous side effects with all types of birth control in the past.  She was counseled to closely follow-up with OB/GYN and to return to the ED for new worsening symptoms, patient agrees with plan.      FINAL CLINICAL  IMPRESSION(S) / ED DIAGNOSES   Final diagnoses:  Vaginal bleeding     Rx / DC Orders   ED Discharge Orders     None        Note:  This document was prepared using Dragon voice recognition software and may include unintentional dictation errors.   Blake Divine, MD 11/20/21 1718

## 2021-11-20 NOTE — ED Triage Notes (Signed)
Patient c/o lower abdominal pain and vaginal bleeding. Patient seen at [facility] in [facility] for the same and told to go to ED for further evaluation. Patient had labs drawn and pelvic exam.

## 2021-11-20 NOTE — ED Triage Notes (Signed)
Patient c/o had IUD remove x 4 weeks ago. Per patient had abdominal pain x 4 days ago. Pt. Stated her menstrual  cycle started yesterday. Per pt.  With heavy bleeding and clotting

## 2021-11-20 NOTE — Discharge Instructions (Addendum)
Your hemoglobin is higher than normal, at 15, however, with the fever, and severe low abdominal pain, and bleeding through 3 pads per hour I am concerned that there could be an infection, and that medications may not slow the bleeding down enough.  Please go to the emergency department for further evaluation, including pain control, possible imaging and OB/GYN consultation.

## 2021-11-21 DIAGNOSIS — N939 Abnormal uterine and vaginal bleeding, unspecified: Secondary | ICD-10-CM | POA: Diagnosis not present

## 2021-11-21 DIAGNOSIS — R1031 Right lower quadrant pain: Secondary | ICD-10-CM | POA: Diagnosis not present

## 2021-11-22 ENCOUNTER — Telehealth: Payer: Self-pay

## 2021-11-22 NOTE — Telephone Encounter (Signed)
Transition Care Management Unsuccessful Follow-up Telephone Call  Date of discharge and from where:  11/21/2021-[facility]  Attempts:  1st Attempt  Reason for unsuccessful TCM follow-up call:  Left voice message

## 2021-11-23 DIAGNOSIS — F419 Anxiety disorder, unspecified: Secondary | ICD-10-CM | POA: Diagnosis not present

## 2021-11-23 DIAGNOSIS — R0789 Other chest pain: Secondary | ICD-10-CM | POA: Diagnosis not present

## 2021-11-23 DIAGNOSIS — R079 Chest pain, unspecified: Secondary | ICD-10-CM | POA: Diagnosis not present

## 2021-11-23 NOTE — Telephone Encounter (Signed)
Transition Care Management Unsuccessful Follow-up Telephone Call  Date of discharge and from where:  11/21/2021 from [facility]  Attempts:  2nd Attempt  Reason for unsuccessful TCM follow-up call:  Left voice message

## 2021-11-24 NOTE — Telephone Encounter (Signed)
Transition Care Management Unsuccessful Follow-up Telephone Call  Date of discharge and from where:  11/21/2021-[facility]  Attempts:  3rd Attempt  Reason for unsuccessful TCM follow-up call:  Left voice message

## 2021-11-25 DIAGNOSIS — R079 Chest pain, unspecified: Secondary | ICD-10-CM | POA: Diagnosis not present

## 2021-11-25 DIAGNOSIS — F331 Major depressive disorder, recurrent, moderate: Secondary | ICD-10-CM | POA: Diagnosis not present

## 2021-11-25 DIAGNOSIS — F411 Generalized anxiety disorder: Secondary | ICD-10-CM | POA: Diagnosis not present

## 2021-11-25 DIAGNOSIS — F900 Attention-deficit hyperactivity disorder, predominantly inattentive type: Secondary | ICD-10-CM | POA: Diagnosis not present

## 2021-12-02 DIAGNOSIS — N29 Other disorders of kidney and ureter in diseases classified elsewhere: Secondary | ICD-10-CM | POA: Diagnosis not present

## 2021-12-02 DIAGNOSIS — Z87442 Personal history of urinary calculi: Secondary | ICD-10-CM | POA: Diagnosis not present

## 2021-12-02 DIAGNOSIS — Q615 Medullary cystic kidney: Secondary | ICD-10-CM | POA: Diagnosis not present

## 2021-12-07 DIAGNOSIS — M7918 Myalgia, other site: Secondary | ICD-10-CM | POA: Diagnosis not present

## 2021-12-17 DIAGNOSIS — R319 Hematuria, unspecified: Secondary | ICD-10-CM | POA: Diagnosis not present

## 2021-12-17 DIAGNOSIS — N10 Acute tubulo-interstitial nephritis: Secondary | ICD-10-CM | POA: Diagnosis not present

## 2021-12-17 DIAGNOSIS — R300 Dysuria: Secondary | ICD-10-CM | POA: Diagnosis not present

## 2021-12-17 DIAGNOSIS — K760 Fatty (change of) liver, not elsewhere classified: Secondary | ICD-10-CM | POA: Diagnosis not present

## 2021-12-17 DIAGNOSIS — R112 Nausea with vomiting, unspecified: Secondary | ICD-10-CM | POA: Diagnosis not present

## 2021-12-17 DIAGNOSIS — N200 Calculus of kidney: Secondary | ICD-10-CM | POA: Diagnosis not present

## 2021-12-19 DIAGNOSIS — N2889 Other specified disorders of kidney and ureter: Secondary | ICD-10-CM | POA: Diagnosis not present

## 2021-12-19 DIAGNOSIS — Q615 Medullary cystic kidney: Secondary | ICD-10-CM | POA: Diagnosis not present

## 2021-12-19 DIAGNOSIS — Z20822 Contact with and (suspected) exposure to covid-19: Secondary | ICD-10-CM | POA: Diagnosis not present

## 2021-12-19 DIAGNOSIS — R109 Unspecified abdominal pain: Secondary | ICD-10-CM | POA: Diagnosis not present

## 2021-12-19 DIAGNOSIS — N200 Calculus of kidney: Secondary | ICD-10-CM | POA: Diagnosis not present

## 2021-12-19 DIAGNOSIS — R112 Nausea with vomiting, unspecified: Secondary | ICD-10-CM | POA: Diagnosis not present

## 2021-12-19 DIAGNOSIS — R319 Hematuria, unspecified: Secondary | ICD-10-CM | POA: Diagnosis not present

## 2021-12-20 DIAGNOSIS — N2889 Other specified disorders of kidney and ureter: Secondary | ICD-10-CM | POA: Diagnosis not present

## 2021-12-20 DIAGNOSIS — N200 Calculus of kidney: Secondary | ICD-10-CM | POA: Diagnosis not present

## 2021-12-20 DIAGNOSIS — R112 Nausea with vomiting, unspecified: Secondary | ICD-10-CM | POA: Diagnosis not present

## 2021-12-20 DIAGNOSIS — R109 Unspecified abdominal pain: Secondary | ICD-10-CM | POA: Diagnosis not present

## 2021-12-20 DIAGNOSIS — Z20822 Contact with and (suspected) exposure to covid-19: Secondary | ICD-10-CM | POA: Diagnosis not present

## 2021-12-20 DIAGNOSIS — Q615 Medullary cystic kidney: Secondary | ICD-10-CM | POA: Diagnosis not present

## 2021-12-20 DIAGNOSIS — R319 Hematuria, unspecified: Secondary | ICD-10-CM | POA: Diagnosis not present

## 2021-12-31 ENCOUNTER — Other Ambulatory Visit: Payer: Self-pay

## 2021-12-31 ENCOUNTER — Ambulatory Visit
Admission: EM | Admit: 2021-12-31 | Discharge: 2021-12-31 | Disposition: A | Discharge status: Home / Self Care | Payer: Medicaid Other | Attending: Internal Medicine | Admitting: Internal Medicine

## 2021-12-31 DIAGNOSIS — A084 Viral intestinal infection, unspecified: Secondary | ICD-10-CM | POA: Diagnosis not present

## 2021-12-31 DIAGNOSIS — Z87442 Personal history of urinary calculi: Secondary | ICD-10-CM | POA: Insufficient documentation

## 2021-12-31 LAB — CBC WITH DIFFERENTIAL/PLATELET
Abs Immature Granulocytes: 0.06 10*3/uL (ref 0.00–0.07)
Basophils Absolute: 0 10*3/uL (ref 0.0–0.1)
Basophils Relative: 0 %
Eosinophils Absolute: 0.1 10*3/uL (ref 0.0–0.5)
Eosinophils Relative: 1 %
HCT: 45.6 % (ref 36.0–46.0)
Hemoglobin: 15.8 g/dL — ABNORMAL HIGH (ref 12.0–15.0)
Immature Granulocytes: 1 %
Lymphocytes Relative: 8 %
Lymphs Abs: 1 10*3/uL (ref 0.7–4.0)
MCH: 29.2 pg (ref 26.0–34.0)
MCHC: 34.6 g/dL (ref 30.0–36.0)
MCV: 84.1 fL (ref 80.0–100.0)
Monocytes Absolute: 0.9 10*3/uL (ref 0.1–1.0)
Monocytes Relative: 7 %
Neutro Abs: 10.6 10*3/uL — ABNORMAL HIGH (ref 1.7–7.7)
Neutrophils Relative %: 83 %
Platelets: 458 10*3/uL — ABNORMAL HIGH (ref 150–400)
RBC: 5.42 MIL/uL — ABNORMAL HIGH (ref 3.87–5.11)
RDW: 12.4 % (ref 11.5–15.5)
WBC: 12.7 10*3/uL — ABNORMAL HIGH (ref 4.0–10.5)
nRBC: 0 % (ref 0.0–0.2)

## 2021-12-31 LAB — COMPREHENSIVE METABOLIC PANEL
ALT: 52 U/L — ABNORMAL HIGH (ref 0–44)
AST: 28 U/L (ref 15–41)
Albumin: 4.4 g/dL (ref 3.5–5.0)
Alkaline Phosphatase: 78 U/L (ref 38–126)
Anion gap: 13 (ref 5–15)
BUN: 12 mg/dL (ref 6–20)
CO2: 21 mmol/L — ABNORMAL LOW (ref 22–32)
Calcium: 9.4 mg/dL (ref 8.9–10.3)
Chloride: 101 mmol/L (ref 98–111)
Creatinine, Ser: 0.81 mg/dL (ref 0.44–1.00)
GFR, Estimated: >60 mL/min (ref >60)
Glucose, Bld: 138 mg/dL — ABNORMAL HIGH (ref 70–99)
Potassium: 4.2 mmol/L (ref 3.5–5.1)
Sodium: 135 mmol/L (ref 135–145)
Total Bilirubin: 0.9 mg/dL (ref 0.3–1.2)
Total Protein: 8.5 g/dL — ABNORMAL HIGH (ref 6.5–8.1)

## 2021-12-31 MED ORDER — ONDANSETRON 4 MG PO TBDP
4.0000 mg | ORAL_TABLET | Freq: Once | ORAL | Status: AC
  Administered 2021-12-31: 4 mg via ORAL

## 2021-12-31 MED ORDER — SODIUM CHLORIDE 0.9 % IV BOLUS
1000.0000 mL | Freq: Once | INTRAVENOUS | Status: AC
  Administered 2021-12-31: 1000 mL via INTRAVENOUS

## 2021-12-31 MED ORDER — ONDANSETRON 4 MG PO TBDP: 4.0000 mg | ORAL_TABLET | Freq: Three times a day (TID) | ORAL | 0 refills | Status: DC | PRN

## 2021-12-31 MED ORDER — KETOROLAC TROMETHAMINE 30 MG/ML IJ SOLN
15.0000 mg | Freq: Once | INTRAMUSCULAR | Status: AC
  Administered 2021-12-31: 15 mg via INTRAVENOUS

## 2021-12-31 NOTE — ED Provider Notes (Addendum)
?[facility] ? ? ? ?CSN: [id number] ?Arrival date & time: 12/31/21  1618 ? ? ?  ? ?History   ?Chief Complaint ?Chief Complaint  ?Patient presents with  ? Nausea  ? Diarrhea  ? ? ?HPI ?Sandra Gibbs is a 26 y.o. female comes to the urgent care with 3 day history of vomiting and diarrhea.  Patient symptoms started with runny nose, sneezing and nasal itching followed by sore throat.  Subsequently patient developed diarrhea and vomiting.  Patient has had about 4 vomiting episodes today.  Vomitus is nonbilious and nonbloody.  She has had over 10 diarrheal bowel movements today.  Stools are not bloody.  Patient denies any abdominal cramping.  She endorses lightheadedness.  No rash noted.  No sick contacts.  Patient tested negative for COVID using a home COVID test.  ? ?HPI ? ?Past Medical History:  ?Diagnosis Date  ? ADHD   ? Kidney stones   ? ? ?Patient Active Problem List  ? Diagnosis Date Noted  ? Personal history of kidney stones 12/31/2021  ? Vomiting 10/06/2021  ? Dysuria 10/06/2021  ? Hair follicle infection 10/06/2021  ? Need for vaccination 01/13/2021  ? Takotsubo cardiomyopathy 01/01/2021  ? Acute systolic CHF (congestive heart failure) (HCC) 01/01/2021  ? Pyelonephritis 12/31/2020  ? Diabetes mellitus without complication (HCC)   ? Elevated troponin   ? Laxity of left anterior cruciate ligament 07/21/2020  ? 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 Renal calculi 12/07/2019  ? Irritable bowel syndrome without diarrhea 12/07/2019  ? 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 Chondral lesion 09/10/2019  ? Patellofemoral disorder of left knee 09/10/2019  ? Generalized anxiety disorder 08/13/2019  ? MDD (major depressive disorder), recurrent episode, moderate (HCC) 08/13/2019  ? OSA (obstructive sleep apnea) 07/04/2017  ? Chronic daily headache 05/17/2017  ? Obesity (BMI 35.0-39.9 without comorbidity) 05/17/2017  ? Hematochezia 01/21/2015   ? Flank pain 01/07/2015  ? Elevated transaminase level 01/07/2015  ? S/P laparoscopic cholecystectomy 01/01/2015  ? Gall bladder stones 12/24/2014  ? Recurrent biliary colic 12/24/2014  ? ? ?Past Surgical History:  ?Procedure Laterality Date  ? CYST REMOVAL TRUNK  2019  ? GALLBLADDER SURGERY  2016  ? KIDNEY SURGERY    ? LEFT HEART CATH AND CORONARY ANGIOGRAPHY N/A 01/01/2021  ? Procedure: LEFT HEART CATH AND CORONARY ANGIOGRAPHY;  Surgeon: Alwyn Pea, MD;  Location: [facility];  Service: Cardiovascular;  Laterality: N/A;  ? OVARIAN CYST SURGERY  2011  ? TONSILLECTOMY    ? ? ?OB History   ?No obstetric history on file. ?  ? ? ? ?Home Medications   ? ?Prior to Admission medications   ?Medication Sig Start Date End Date Taking? Authorizing Provider  ?acetaminophen (TYLENOL) 325 MG tablet Take by mouth. 12/20/21  Yes [provider]  ?busPIRone (BUSPAR) 30 MG tablet Take 30 mg by mouth 2 (two) times daily. 10/18/19  Yes [provider]  ?ergocalciferol (VITAMIN D2) 1.25 MG (50000 UT) capsule Take by mouth. 12/20/21 02/08/22 Yes [provider]  ?fluconazole (DIFLUCAN) 100 MG tablet Take 2 tablets 10/12/21  Yes [provider]  ?FLUoxetine (PROZAC) 20 MG capsule Take 40 mg by mouth. 06/30/20  Yes [provider]  ?lidocaine (XYLOCAINE) 2 % jelly Place into the urethra. 10/12/21  Yes [provider]  ?meloxicam (MOBIC) 15 MG tablet Take 1 tablet by mouth daily. 10/07/21 10/07/22 Yes [provider]  ?ondansetron (ZOFRAN-ODT) 4  MG disintegrating tablet Take 1 tablet (4 mg total) by mouth every 8 (eight) hours as needed for nausea or vomiting. 12/31/21  Yes Olson Lucarelli, Britta Mccreedy, MD  ?promethazine (PHENERGAN) 25 MG tablet Take by mouth. 12/20/21  Yes [provider]  ?sodium chloride irrigation 0.9 % irrigation Irrigate with as directed. 10/12/21  Yes [provider]  ?cefdinir (OMNICEF) 300 MG capsule Take by mouth. 12/17/21   [provider]  ?CONCERTA 27 MG CR tablet Take 27 mg by mouth at bedtime. 09/29/20   [provider]  ?guanFACINE (INTUNIV) 2 MG TB24 ER tablet Take 2 mg by mouth at bedtime. 10/05/21   [provider]  ?ketorolac (TORADOL) 10 MG tablet Take 10 mg by mouth 3 (three) times daily. 07/30/21   [provider]  ?LORazepam (ATIVAN) 1 MG tablet Take 1 mg by mouth as needed. 11/25/21   [provider]  ?metFORMIN (GLUCOPHAGE) 500 MG tablet Take 1 tablet by mouth daily before breakfast. 06/11/20   [provider]  ?metoprolol succinate (TOPROL-XL) 50 MG 24 hr tablet Take 50 mg by mouth daily. 03/17/21   [provider]  ?mupirocin ointment (BACTROBAN) 2 % Apply topically 3 (three) times daily. 10/06/21   [provider]  ?nitrofurantoin, macrocrystal-monohydrate, (MACROBID) 100 MG capsule Take 1 capsule (100 mg total) by mouth 2 (two) times daily. 07/28/21   Rushie Chestnut, PA-C  ?ondansetron (ZOFRAN) 4 MG tablet Take by mouth 2 (two) times daily. 12/17/21   [provider]  ?oxyCODONE (OXY IR/ROXICODONE) 5 MG immediate release tablet Take by mouth. 12/18/21   [provider]  ?solifenacin (VESICARE) 5 MG tablet Take 5 mg by mouth daily. 11/22/21   [provider]  ?dicyclomine (BENTYL) 10 MG capsule  04/19/18 10/19/20  [provider]  ? ? ?Family History ?Family History  ?Problem Relation Age of Onset  ? Heart murmur Mother   ? Mitral valve prolapse Mother   ? Heart attack Father   ? Atrial fibrillation Maternal Grandfather   ? Stroke Paternal Grandmother   ? Mitral valve prolapse Paternal Grandmother   ? ? ?Social History ?Social History  ? ?Tobacco Use  ? Smoking status: Never  ? Smokeless tobacco: Never  ?Vaping Use  ? Vaping Use: Never used  ?Substance Use Topics  ? Alcohol use: No  ? Drug use: No  ? ? ? ?Allergies   ?Wound dressing adhesive, Wound dressings, Latex, and Morphine ? ? ?Review of Systems ?Review of Systems   ?Constitutional: Negative.   ?HENT:  Positive for congestion, rhinorrhea and sore throat. Negative for postnasal drip, sinus pressure and tinnitus.   ?Respiratory:  Positive for cough. Negative for chest tightness, shortness of breath and wheezing.   ?Cardiovascular: Negative.   ?Gastrointestinal:  Positive for diarrhea, nausea and vomiting. Negative for abdominal pain.  ?Genitourinary: Negative.   ?Musculoskeletal:  Positive for arthralgias and myalgias.  ?Skin: Negative.   ?Neurological:  Positive for dizziness. Negative for light-headedness.  ? ? ?Physical Exam ?Triage Vital Signs ?ED Triage Vitals  ?Enc Vitals Group  ?   BP 12/31/21 1633 118/84  ?   Pulse Rate 12/31/21 1633 (!) 128  ?   Resp 12/31/21 1633 (!) 22  ?   Temp 12/31/21 1633 99.3 ?F (37.4 ?C)  ?   Temp Source 12/31/21 1633 Oral  ?   SpO2 12/31/21 1633 96 %  ?   Weight 12/31/21 1630 228 lb 8 oz (103.6 kg)  ?   Height --   ?  Head Circumference --   ?   Peak Flow --   ?   Pain Score 12/31/21 1630 0  ?   Pain Loc --   ?   Pain Edu? --   ?   Excl. in [place]? --   ? ?No data found. ? ?Updated Vital Signs ?BP 112/85 (BP Location: Left Arm)   Pulse (!) 103   Temp 99.3 ?F (37.4 ?C) (Oral)   Resp (!) 22   Wt 103.6 kg   LMP 12/12/2021 (Approximate)   SpO2 96%   BMI 44.63 kg/m?  ? ?Visual Acuity ?Right Eye Distance:   ?Left Eye Distance:   ?Bilateral Distance:   ? ?Right Eye Near:   ?Left Eye Near:    ?Bilateral Near:    ? ?Physical Exam ?Vitals and nursing note reviewed.  ?Constitutional:   ?   General: She is not in acute distress. ?   Appearance: She is ill-appearing.  ?HENT:  ?   Right Ear: Tympanic membrane normal.  ?   Left Ear: Tympanic membrane normal.  ?Eyes:  ?   Pupils: Pupils are equal, round, and reactive to light.  ?Cardiovascular:  ?   Rate and Rhythm: Normal rate and regular rhythm.  ?   Pulses: Normal pulses.  ?   Heart sounds: Normal heart sounds.  ?Pulmonary:  ?   Effort: Pulmonary effort is normal.  ?   Breath sounds: Normal breath  sounds.  ?Abdominal:  ?   General: Bowel sounds are normal.  ?   Palpations: Abdomen is soft.  ?Musculoskeletal:     ?   General: Normal range of motion.  ?Skin: ?   General: Skin is warm.  ?Neurological:  ?   Mental Status: She

## 2021-12-31 NOTE — Discharge Instructions (Signed)
Increase oral fluid intake ?Take medications as prescribed ?We will call you with recommendations if labs are abnormal ?If his symptoms worsen please return to urgent care to be reevaluated. ?

## 2021-12-31 NOTE — ED Triage Notes (Signed)
Patient is here for "Vomiting for about 3 days now". Also "loose stools, watery stools about every 10 mins". Last emesis "1605". No fever.  ?

## 2022-01-10 DIAGNOSIS — N132 Hydronephrosis with renal and ureteral calculous obstruction: Secondary | ICD-10-CM | POA: Diagnosis not present

## 2022-01-10 DIAGNOSIS — R509 Fever, unspecified: Secondary | ICD-10-CM | POA: Diagnosis not present

## 2022-01-10 DIAGNOSIS — R109 Unspecified abdominal pain: Secondary | ICD-10-CM | POA: Diagnosis not present

## 2022-01-20 DIAGNOSIS — R109 Unspecified abdominal pain: Secondary | ICD-10-CM | POA: Diagnosis not present

## 2022-01-20 DIAGNOSIS — K589 Irritable bowel syndrome without diarrhea: Secondary | ICD-10-CM | POA: Diagnosis not present

## 2022-01-20 DIAGNOSIS — N200 Calculus of kidney: Secondary | ICD-10-CM | POA: Diagnosis not present

## 2022-01-20 DIAGNOSIS — N29 Other disorders of kidney and ureter in diseases classified elsewhere: Secondary | ICD-10-CM | POA: Diagnosis not present

## 2022-01-24 ENCOUNTER — Ambulatory Visit
Admission: EM | Admit: 2022-01-24 | Discharge: 2022-01-24 | Disposition: A | Discharge status: Home / Self Care | Payer: Medicaid Other | Attending: Emergency Medicine | Admitting: Emergency Medicine

## 2022-01-24 DIAGNOSIS — R197 Diarrhea, unspecified: Secondary | ICD-10-CM | POA: Insufficient documentation

## 2022-01-24 LAB — GASTROINTESTINAL PANEL BY PCR, STOOL (REPLACES STOOL CULTURE)

## 2022-01-24 LAB — URINALYSIS, ROUTINE W REFLEX MICROSCOPIC
Bilirubin Urine: NEGATIVE
Glucose, UA: NEGATIVE mg/dL
Hgb urine dipstick: NEGATIVE
Ketones, ur: NEGATIVE mg/dL
Leukocytes,Ua: NEGATIVE
Nitrite: NEGATIVE
Protein, ur: NEGATIVE mg/dL
Specific Gravity, Urine: >1.03 — ABNORMAL HIGH (ref 1.005–1.030)
pH: 5.5 (ref 5.0–8.0)

## 2022-01-24 MED ORDER — LOPERAMIDE HCL 2 MG PO CAPS: 2.0000 mg | ORAL_CAPSULE | Freq: Four times a day (QID) | ORAL | 0 refills | Status: DC | PRN

## 2022-01-24 MED ORDER — ONDANSETRON 4 MG PO TBDP: 4.0000 mg | ORAL_TABLET | Freq: Three times a day (TID) | ORAL | 0 refills | Status: DC | PRN

## 2022-01-24 MED ORDER — AZITHROMYCIN 250 MG PO TABS: 250.0000 mg | ORAL_TABLET | Freq: Every day | ORAL | 0 refills | Status: DC

## 2022-01-24 NOTE — Discharge Instructions (Addendum)
GI panel is pending, this looks that common bacteria is that may cause diarrhea, you will be notified of all test results ? ?We will cover for bacteria prophylactically due to your medical history, take azithromycin as directed ? ?You may use Zofran every 8 hours to help combat vomiting or nausea, wait 30 minutes to an hour before attempting to eat ? ?Increase your fluid intake until your symptoms have resolved to prevent dehydration, eat a blander diet to prevent further stomach irritation ? ?You may continue use of high Cosamin to help with your abdominal pain, may also use Tylenol and ibuprofen as needed ? ?You may use Imodium up to 4 times a day, be mindful of overuse will cause constipation ?

## 2022-01-24 NOTE — ED Provider Notes (Signed)
?[facility] ? ? ? ?CSN: [id number] ?Arrival date & time: 01/24/22  1039 ? ? ?  ? ?History   ?Chief Complaint ?Chief Complaint  ?Patient presents with  ? Abdominal Pain  ? ? ?HPI ?Sandra Gibbs is a 26 y.o. female.  ? ?Patient presents with right upper and lower quadrant abdominal pain, vomiting and diarrhea beginning this morning.  1 episode of diarrhea, endorses at least 20 episodes of diarrhea .  Decreased appetite but able to tolerate fluids.  No known sick contacts.  Denies bloating, increased gas production, fever, chills, hematochezia, changes in diet or recent travel, urinary symptoms or vaginal symptoms history of IBS, currently taking Bentyl.  ? ? ?Past Medical History:  ?Diagnosis Date  ? ADHD   ? Kidney stones   ? ? ?Patient Active Problem List  ? Diagnosis Date Noted  ? Personal history of kidney stones 12/31/2021  ? Vomiting 10/06/2021  ? Dysuria 10/06/2021  ? Hair follicle infection 10/06/2021  ? Need for vaccination 01/13/2021  ? Takotsubo cardiomyopathy 01/01/2021  ? Acute systolic CHF (congestive heart failure) (HCC) 01/01/2021  ? Pyelonephritis 12/31/2020  ? Diabetes mellitus without complication (HCC)   ? Elevated troponin   ? Laxity of left anterior cruciate ligament 07/21/2020  ? 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 Renal calculi 12/07/2019  ? Irritable bowel syndrome without diarrhea 12/07/2019  ? 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 Chondral lesion 09/10/2019  ? Patellofemoral disorder of left knee 09/10/2019  ? Generalized anxiety disorder 08/13/2019  ? MDD (major depressive disorder), recurrent episode, moderate (HCC) 08/13/2019  ? OSA (obstructive sleep apnea) 07/04/2017  ? Chronic daily headache 05/17/2017  ? Obesity (BMI 35.0-39.9 without comorbidity) 05/17/2017  ? Hematochezia 01/21/2015  ? Flank pain 01/07/2015  ? Elevated transaminase level 01/07/2015  ? S/P laparoscopic cholecystectomy 01/01/2015   ? Gall bladder stones 12/24/2014  ? Recurrent biliary colic 12/24/2014  ? ? ?Past Surgical History:  ?Procedure Laterality Date  ? CYST REMOVAL TRUNK  2019  ? GALLBLADDER SURGERY  2016  ? KIDNEY SURGERY    ? LEFT HEART CATH AND CORONARY ANGIOGRAPHY N/A 01/01/2021  ? Procedure: LEFT HEART CATH AND CORONARY ANGIOGRAPHY;  Surgeon: Alwyn Pea, MD;  Location: [facility];  Service: Cardiovascular;  Laterality: N/A;  ? OVARIAN CYST SURGERY  2011  ? TONSILLECTOMY    ? ? ?OB History   ?No obstetric history on file. ?  ? ? ? ?Home Medications   ? ?Prior to Admission medications   ?Medication Sig Start Date End Date Taking? Authorizing Provider  ?busPIRone (BUSPAR) 30 MG tablet Take 30 mg by mouth 2 (two) times daily. 10/18/19  Yes [provider]  ?FLUoxetine (PROZAC) 20 MG capsule Take 40 mg by mouth. 06/30/20  Yes [provider]  ?guanFACINE (INTUNIV) 1 MG TB24 ER tablet  08/10/21  Yes [provider]  ?Hyoscyamine Sulfate SL 0.125 MG SUBL Place under the tongue. 01/20/22  Yes [provider]  ?LORazepam (ATIVAN) 1 MG tablet Take by mouth. 11/25/21  Yes [provider]  ?acetaminophen (TYLENOL) 325 MG tablet Take by mouth. 12/20/21   [provider]  ?cefdinir (OMNICEF) 300 MG capsule Take by mouth. 12/17/21   [provider]  ?CONCERTA 27 MG CR tablet Take 27 mg by mouth at bedtime. 09/29/20   [provider]  ?ergocalciferol (VITAMIN D2) 1.25 MG (50000 UT) capsule Take by mouth. 12/20/21 02/08/22  [provider]  ?fluconazole (DIFLUCAN) 100 MG tablet Take 2 tablets 10/12/21   [provider]  ?guanFACINE (INTUNIV) 2 MG TB24 ER tablet Take 2 mg by mouth at bedtime. 10/05/21   [provider]  ?hyoscyamine (LEVSIN SL) 0.125 MG SL tablet Place under the tongue. 01/20/22   [provider]  ?ketorolac (TORADOL) 10 MG tablet Take 10 mg by mouth 3 (three) times daily. 07/30/21   [provider]  ?lidocaine  (XYLOCAINE) 2 % jelly Place into the urethra. 10/12/21   [provider]  ?LORazepam (ATIVAN) 1 MG tablet Take 1 mg by mouth as needed. 11/25/21   [provider]  ?meloxicam (MOBIC) 15 MG tablet Take 1 tablet by mouth daily. 10/07/21 10/07/22  [provider]  ?metFORMIN (GLUCOPHAGE) 500 MG tablet Take 1 tablet by mouth daily before breakfast. 06/11/20   [provider]  ?metoprolol succinate (TOPROL-XL) 50 MG 24 hr tablet Take 50 mg by mouth daily. 03/17/21   [provider]  ?mupirocin ointment (BACTROBAN) 2 % Apply topically 3 (three) times daily. 10/06/21   [provider]  ?nitrofurantoin, macrocrystal-monohydrate, (MACROBID) 100 MG capsule Take 1 capsule (100 mg total) by mouth 2 (two) times daily. 07/28/21   Rushie Chestnutovington, Sarah M, PA-C  ?ondansetron (ZOFRAN) 4 MG tablet Take by mouth 2 (two) times daily. 12/17/21   [provider]  ?ondansetron (ZOFRAN-ODT) 4 MG disintegrating tablet Take 1 tablet (4 mg total) by mouth every 8 (eight) hours as needed for nausea or vomiting. 12/31/21   Lamptey, Britta MccreedyPhilip O, MD  ?oxyCODONE (OXY IR/ROXICODONE) 5 MG immediate release tablet Take by mouth. 12/18/21   [provider]  ?promethazine (PHENERGAN) 25 MG tablet Take by mouth. 12/20/21   [provider]  ?sodium chloride irrigation 0.9 % irrigation Irrigate with as directed. 10/12/21   [provider]  ?solifenacin (VESICARE) 5 MG tablet Take 5 mg by mouth daily. 11/22/21   [provider]  ?dicyclomine (BENTYL) 10 MG capsule  04/19/18 10/19/20  [provider]  ? ? ?Family History ?Family History  ?Problem Relation Age of Onset  ? Heart murmur Mother   ? Mitral valve prolapse Mother   ? Heart attack Father   ? Atrial fibrillation Maternal Grandfather   ? Stroke Paternal Grandmother   ? Mitral valve prolapse Paternal Grandmother   ? ? ?Social History ?Social History  ? ?Tobacco Use  ? Smoking status: Never  ? Smokeless tobacco: Never   ?Vaping Use  ? Vaping Use: Never used  ?Substance Use Topics  ? Alcohol use: No  ? Drug use: No  ? ? ? ?Allergies   ?Wound dressing adhesive, Wound dressings, Latex, and Morphine ? ? ?Review of Systems ?Review of Systems  ?Constitutional: Negative.   ?HENT: Negative.    ?Respiratory: Negative.    ?Gastrointestinal:  Positive for abdominal pain, diarrhea and vomiting. Negative for abdominal distention, anal bleeding, blood in stool, constipation, nausea and rectal pain.  ?Skin: Negative.   ?Neurological: Negative.   ? ? ?Physical Exam ?Triage Vital Signs ?ED Triage Vitals  ?Enc Vitals Group  ?   BP 01/24/22 1115 118/70  ?   Pulse Rate 01/24/22 1115 77  ?   Resp 01/24/22 1115 18  ?   Temp 01/24/22 1115 98.2 ?F (36.8 ?C)  ?   Temp Source 01/24/22 1115 Oral  ?   SpO2 01/24/22 1115 99 %  ?   Weight 01/24/22 1113 230 lb (104.3 kg)  ?   Height 01/24/22  1113 5' (1.524 m)  ?   Head Circumference --   ?   Peak Flow --   ?   Pain Score 01/24/22 1110 5  ?   Pain Loc --   ?   Pain Edu? --   ?   Excl. in [place]? --   ? ?No data found. ? ?Updated Vital Signs ?BP 118/70 (BP Location: Left Arm)   Pulse 77   Temp 98.2 ?F (36.8 ?C) (Oral)   Resp 18   Ht 5' (1.524 m)   Wt 230 lb (104.3 kg)   LMP 01/10/2022 (Exact Date)   SpO2 99%   BMI 44.92 kg/m?  ? ?Visual Acuity ?Right Eye Distance:   ?Left Eye Distance:   ?Bilateral Distance:   ? ?Right Eye Near:   ?Left Eye Near:    ?Bilateral Near:    ? ?Physical Exam ?Constitutional:   ?   Appearance: She is well-developed.  ?HENT:  ?   Head: Normocephalic.  ?Pulmonary:  ?   Effort: Pulmonary effort is normal.  ?Abdominal:  ?   General: Abdomen is flat. Bowel sounds are increased.  ?   Palpations: Abdomen is soft.  ?   Tenderness: There is no abdominal tenderness. There is no guarding or rebound.  ?Skin: ?   General: Skin is warm and dry.  ?Neurological:  ?   Mental Status: She is alert.  ? ? ? ?UC Treatments / Results  ?Labs ?(all labs ordered are listed, but only abnormal results are  displayed) ?Labs Reviewed  ?URINALYSIS, ROUTINE W REFLEX MICROSCOPIC  ? ? ?EKG ? ? ?Radiology ?No results found. ? ?Procedures ?Procedures (including critical care time) ? ?Medications Ordered in UC ?Medications - N

## 2022-01-24 NOTE — ED Triage Notes (Signed)
Patient is here for "Abd Pain" that started around midnight. Right lower abd, with "really bad smelly burps". Diarrhea loose stools started "looks like oil". Vomiting "around 330 am". Voids "normal".  ?

## 2022-01-27 DIAGNOSIS — M7918 Myalgia, other site: Secondary | ICD-10-CM | POA: Diagnosis not present

## 2022-02-04 ENCOUNTER — Ambulatory Visit
Admission: EM | Admit: 2022-02-04 | Discharge: 2022-02-04 | Disposition: A | Discharge status: Home / Self Care | Payer: Medicaid Other | Attending: Physician Assistant | Admitting: Physician Assistant

## 2022-02-04 DIAGNOSIS — R109 Unspecified abdominal pain: Secondary | ICD-10-CM | POA: Insufficient documentation

## 2022-02-04 DIAGNOSIS — N200 Calculus of kidney: Secondary | ICD-10-CM | POA: Insufficient documentation

## 2022-02-04 LAB — URINALYSIS, ROUTINE W REFLEX MICROSCOPIC
Glucose, UA: NEGATIVE mg/dL
Hgb urine dipstick: NEGATIVE
Leukocytes,Ua: NEGATIVE
Nitrite: NEGATIVE
Specific Gravity, Urine: 1.025 (ref 1.005–1.030)
pH: 6 (ref 5.0–8.0)

## 2022-02-04 LAB — URINALYSIS, MICROSCOPIC (REFLEX)

## 2022-02-04 MED ORDER — OXYCODONE HCL 5 MG PO TABS: 5.0000 mg | ORAL_TABLET | Freq: Three times a day (TID) | ORAL | 0 refills | Status: AC | PRN

## 2022-02-04 NOTE — Discharge Instructions (Signed)
-  Urinalysis is not consistent with a urinary tract infection at this time but I am sending urine for culture and someone will contact you if we need to send antibiotics. ?- I have sent something for pain for your flank pain which is likely related to the passing a stone given your history.  Keep your appointment with your urologist but go to emergency department sooner if you were to get a fever or increased pain. ?

## 2022-02-04 NOTE — ED Provider Notes (Signed)
?[facility] ? ? ? ?CSN: [id number] ?Arrival date & time: 02/04/22  1617 ? ? ?  ? ?History   ?Chief Complaint ?Chief Complaint  ?Patient presents with  ? Dysuria  ? Flank Pain  ? ? ?HPI ?Sandra Gibbs is a 25 y.o. female with history of recurrent nephrolithiasis, diabetes, obesity, medullary kidney disease.  Patient is presenting for increase in baseline flank pain.  Patient reports she normally has 3 out of 10 flank pain related to chronic kidney stones.  Reports since yesterday she has had 7-8 out of 10 pain.  Believes she is passing a kidney stone on the left side.  She also reports dysuria and urinary frequency.  Denies fever, fatigue, abdominal pain.  Has had some nausea and vomiting.  Has an appointment with her urologist in a few days.  Patient says she passed a kidney stone a couple of months ago and pain was similar.  No other complaints. ? ?HPI ? ?Past Medical History:  ?Diagnosis Date  ? ADHD   ? Kidney stones   ? ? ?Patient Active Problem List  ? Diagnosis Date Noted  ? Personal history of kidney stones 12/31/2021  ? Vomiting 10/06/2021  ? Dysuria 10/06/2021  ? Hair follicle infection [id number]  ? Need for vaccination 01/13/2021  ? Takotsubo cardiomyopathy 01/01/2021  ? Acute systolic CHF (congestive heart failure) ([place]) 01/01/2021  ? Pyelonephritis 12/31/2020  ? Diabetes mellitus without complication ([place])   ? Elevated troponin   ? Laxity of left anterior cruciate ligament 07/21/2020  ? 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 Renal calculi 12/07/2019  ? Irritable bowel syndrome without diarrhea 12/07/2019  ? 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 Chondral lesion 09/10/2019  ? Patellofemoral disorder of left knee 09/10/2019  ? Generalized anxiety disorder 08/13/2019  ? MDD (major depressive disorder), recurrent episode, moderate ([place]) 08/13/2019  ? OSA (obstructive sleep apnea) 07/04/2017  ? Chronic daily headache  05/17/2017  ? Obesity (BMI 35.0-39.9 without comorbidity) 05/17/2017  ? Hematochezia 01/21/2015  ? Flank pain 01/07/2015  ? Elevated transaminase level 01/07/2015  ? S/P laparoscopic cholecystectomy 01/01/2015  ? Gall bladder stones 12/24/2014  ? Recurrent biliary colic [id number]  ? ? ?Past Surgical History:  ?Procedure Laterality Date  ? CYST REMOVAL TRUNK  2019  ? GALLBLADDER SURGERY  2016  ? KIDNEY SURGERY    ? LEFT HEART CATH AND CORONARY ANGIOGRAPHY N/A 01/01/2021  ? Procedure: LEFT HEART CATH AND CORONARY ANGIOGRAPHY;  Surgeon: Yolonda Kida, MD;  Location: [place] [facility];  Service: Cardiovascular;  Laterality: N/A;  ? OVARIAN CYST SURGERY  2011  ? TONSILLECTOMY    ? ? ?OB History   ?No obstetric history on file. ?  ? ? ? ?Home Medications   ? ?Prior to Admission medications   ?Medication Sig Start Date End Date Taking? Authorizing Provider  ?acetaminophen (TYLENOL) 325 MG tablet Take by mouth. 12/20/21  Yes [provider]  ?azithromycin (ZITHROMAX) 250 MG tablet Take 1 tablet (250 mg total) by mouth daily. Take first 2 tablets together, then 1 every day until finished. 01/24/22  Yes White, Adrienne R, NP  ?busPIRone (BUSPAR) 30 MG tablet Take 30 mg by mouth 2 (two) times daily. 10/18/19  Yes [provider]  ?cefdinir (OMNICEF) 300 MG capsule Take by mouth. 12/17/21  Yes [provider]  ?CONCERTA 27 MG CR tablet Take 27 mg by mouth at bedtime. 09/29/20  Yes [provider]  ?  ergocalciferol (VITAMIN D2) 1.25 MG (50000 UT) capsule Take by mouth. 12/20/21 02/08/22 Yes [provider]  ?fluconazole (DIFLUCAN) 100 MG tablet Take 2 tablets 10/12/21  Yes [provider]  ?FLUoxetine (PROZAC) 20 MG capsule Take 40 mg by mouth. 06/30/20  Yes [provider]  ?guanFACINE (INTUNIV) 1 MG TB24 ER tablet  08/10/21  Yes [provider]  ?guanFACINE (INTUNIV) 2 MG TB24 ER tablet Take 2 mg by mouth at bedtime. 10/05/21  Yes [provider]   ?hyoscyamine (LEVSIN SL) 0.125 MG SL tablet Place under the tongue. 01/20/22  Yes [provider]  ?Hyoscyamine Sulfate SL 0.125 MG SUBL Place under the tongue. 01/20/22  Yes [provider]  ?ketorolac (TORADOL) 10 MG tablet Take 10 mg by mouth 3 (three) times daily. 07/30/21  Yes [provider]  ?LORazepam (ATIVAN) 1 MG tablet Take 1 mg by mouth as needed. 11/25/21  Yes [provider]  ?meloxicam (MOBIC) 15 MG tablet Take 1 tablet by mouth daily. 10/07/21 10/07/22 Yes [provider]  ?metFORMIN (GLUCOPHAGE) 500 MG tablet Take 1 tablet by mouth daily before breakfast. 06/11/20  Yes [provider]  ?metoprolol succinate (TOPROL-XL) 50 MG 24 hr tablet Take 50 mg by mouth daily. 03/17/21  Yes [provider]  ?mupirocin ointment (BACTROBAN) 2 % Apply topically 3 (three) times daily. 10/06/21  Yes [provider]  ?ondansetron (ZOFRAN) 4 MG tablet Take by mouth 2 (two) times daily. 12/17/21  Yes [provider]  ?oxyCODONE (ROXICODONE) 5 MG immediate release tablet Take 1 tablet (5 mg total) by mouth every 8 (eight) hours as needed for up to 3 days for severe pain. 02/04/22 02/07/22 Yes Danton Clap, PA-C  ?promethazine (PHENERGAN) 25 MG tablet Take by mouth. 12/20/21  Yes [provider]  ?sodium chloride irrigation 0.9 % irrigation Irrigate with as directed. 10/12/21  Yes [provider]  ?solifenacin (VESICARE) 5 MG tablet Take 5 mg by mouth daily. 11/22/21  Yes [provider]  ?lidocaine (XYLOCAINE) 2 % jelly Place into the urethra. 10/12/21   [provider]  ?loperamide (IMODIUM) 2 MG capsule Take 1 capsule (2 mg total) by mouth 4 (four) times daily as needed for diarrhea or loose stools. 01/24/22   Hans Eden, NP  ?LORazepam (ATIVAN) 1 MG tablet Take by mouth. 11/25/21   [provider]  ?nitrofurantoin, macrocrystal-monohydrate, (MACROBID) 100 MG capsule Take 1 capsule (100 mg total) by  mouth 2 (two) times daily. 07/28/21   Hughie Closs, PA-C  ?ondansetron (ZOFRAN-ODT) 4 MG disintegrating tablet Take 1 tablet (4 mg total) by mouth every 8 (eight) hours as needed for nausea or vomiting. 01/24/22   Hans Eden, NP  ?dicyclomine (BENTYL) 10 MG capsule  04/19/18 10/19/20  [provider]  ? ? ?Family History ?Family History  ?Problem Relation Age of Onset  ? Heart murmur Mother   ? Mitral valve prolapse Mother   ? Heart attack Father   ? Atrial fibrillation Maternal Grandfather   ? Stroke Paternal Grandmother   ? Mitral valve prolapse Paternal Grandmother   ? ? ?Social History ?Social History  ? ?Tobacco Use  ? Smoking status: Never  ? Smokeless tobacco: Never  ?Vaping Use  ? Vaping Use: Never used  ?Substance Use Topics  ? Alcohol use: No  ? Drug use: No  ? ? ? ?Allergies   ?Wound dressing adhesive, Wound dressings, Latex, and Morphine ? ? ?Review of Systems ?Review of Systems  ?  Constitutional:  Negative for chills, fatigue and fever.  ?Gastrointestinal:  Negative for abdominal pain, diarrhea, nausea and vomiting.  ?Genitourinary:  Positive for dysuria, flank pain, frequency and urgency. Negative for decreased urine volume, hematuria, pelvic pain, vaginal bleeding, vaginal discharge and vaginal pain.  ?Musculoskeletal:  Negative for back pain.  ?Skin:  Negative for rash.  ? ? ?Physical Exam ?Triage Vital Signs ?ED Triage Vitals  ?Enc Vitals Group  ?   BP   ?   Pulse   ?   Resp   ?   Temp   ?   Temp src   ?   SpO2   ?   Weight   ?   Height   ?   Head Circumference   ?   Peak Flow   ?   Pain Score   ?   Pain Loc   ?   Pain Edu?   ?   Excl. in [place]?   ? ?No data found. ? ?Updated Vital Signs ?BP 113/73 (BP Location: Right Arm)   Temp 98.3 ?F (36.8 ?C) (Oral)   Resp 18   Ht 5' (1.524 m)   Wt 230 lb (104.3 kg)   LMP 01/10/2022 (Exact Date)   SpO2 99%   BMI 44.92 kg/m?  ?  ? ?Physical Exam ?Vitals and nursing note reviewed.  ?Constitutional:   ?   General: She is not in acute  distress. ?   Appearance: Normal appearance. She is not ill-appearing or toxic-appearing.  ?HENT:  ?   Head: Normocephalic and atraumatic.  ?Eyes:  ?   General: No scleral icterus.    ?   Right eye: No discharge.     ?   Left eye:

## 2022-02-04 NOTE — ED Triage Notes (Signed)
Pt here with C/O left flank pain and pain with urination for 2 days. Has appt with Uro on Monday.  ?

## 2022-02-06 LAB — URINE CULTURE

## 2022-02-16 DIAGNOSIS — R197 Diarrhea, unspecified: Secondary | ICD-10-CM | POA: Diagnosis not present

## 2022-02-16 DIAGNOSIS — R1013 Epigastric pain: Secondary | ICD-10-CM | POA: Insufficient documentation

## 2022-02-16 DIAGNOSIS — K589 Irritable bowel syndrome without diarrhea: Secondary | ICD-10-CM | POA: Diagnosis not present

## 2022-02-16 DIAGNOSIS — Q615 Medullary cystic kidney: Secondary | ICD-10-CM | POA: Diagnosis not present

## 2022-02-18 ENCOUNTER — Telehealth: Payer: Self-pay | Admitting: *Deleted

## 2022-02-18 NOTE — Telephone Encounter (Signed)
Primary Cardiologist:None ? ?Chart reviewed as part of pre-operative protocol coverage. Because of Catlin Doria Hinkle's past medical history and time since last visit, he/she will require a follow-up visit in order to better assess preoperative cardiovascular risk. ? ?Pre-op covering staff: ?- Please schedule appointment and call patient to inform them. ?- Please contact requesting surgeon's office via preferred method (i.e, phone, fax) to inform them of need for appointment prior to surgery. ? ?If applicable, this message will also be routed to pharmacy pool and/or primary cardiologist for input on holding anticoagulant/antiplatelet agent as requested below so that this information is available at time of patient's appointment.  ? ?Ronney Asters, NP  ?02/18/2022, 4:34 PM  ? ?

## 2022-02-18 NOTE — Telephone Encounter (Signed)
? ?  Pre-operative Risk Assessment  ?  ?Patient Name: Sandra Gibbs  ?DOB: Mar 18, 1996 ?MRN: [id number]  ? ?  ? ?Request for Surgical Clearance   ? ?Procedure:   COLONOSCOPY AND UPPER ENDOSCOPY ? ?Date of Surgery:  Clearance 04/05/22                              ?   ?Surgeon:  DR. Rexene Agent ?Surgeon's Group or Practice Name:  [facility] ?Phone number:  [phone] ?Fax number:  [phone] ATTN: Godfrey Pick, CMA  ?  ?Type of Clearance Requested:   ?- Medical ; NOTES ON CLEARANCE FORM ARE "ALL PATIENTS WILL REMAIN ON ASPIRIN/ANTI-INFLAMMATORIES UNLESS CONTRAINDICATED ? ?ANTIPLATELETS/ANTICOAGULANTS : THIS PATIENT IS NOT ON ANTIPLATELET OR ANTICOAGULATION THERAPY ?  ?Type of Anesthesia:  Not Indicated; OFFICE CLOSED; TRIED TO CALL TO CONFIRM TYPE OF ANESTHESIA ?  ?Additional requests/questions:   ? ?Signed, ?Danielle Rankin   ?02/18/2022, 2:09 PM  ? ?

## 2022-02-18 NOTE — Telephone Encounter (Signed)
I will send a message to Dr. Graciela Husbands scheduler [facility] to reach out to the pt with an appt for pre op clearance.  ?

## 2022-02-22 NOTE — Telephone Encounter (Addendum)
EP scheduler [place], called the pt yesterday for appt. Left message for the pt to call back for appt. Pt has not returned call as of this morning.  ?

## 2022-02-24 DIAGNOSIS — N200 Calculus of kidney: Secondary | ICD-10-CM | POA: Diagnosis not present

## 2022-02-24 DIAGNOSIS — N83201 Unspecified ovarian cyst, right side: Secondary | ICD-10-CM | POA: Diagnosis not present

## 2022-02-24 DIAGNOSIS — N3000 Acute cystitis without hematuria: Secondary | ICD-10-CM | POA: Diagnosis not present

## 2022-02-24 DIAGNOSIS — R1031 Right lower quadrant pain: Secondary | ICD-10-CM | POA: Diagnosis not present

## 2022-02-24 DIAGNOSIS — R509 Fever, unspecified: Secondary | ICD-10-CM | POA: Diagnosis not present

## 2022-02-24 DIAGNOSIS — K760 Fatty (change of) liver, not elsewhere classified: Secondary | ICD-10-CM | POA: Diagnosis not present

## 2022-02-24 DIAGNOSIS — R112 Nausea with vomiting, unspecified: Secondary | ICD-10-CM | POA: Diagnosis not present

## 2022-02-24 DIAGNOSIS — N83202 Unspecified ovarian cyst, left side: Secondary | ICD-10-CM | POA: Diagnosis not present

## 2022-02-25 DIAGNOSIS — N83202 Unspecified ovarian cyst, left side: Secondary | ICD-10-CM | POA: Diagnosis not present

## 2022-02-25 DIAGNOSIS — N83201 Unspecified ovarian cyst, right side: Secondary | ICD-10-CM | POA: Diagnosis not present

## 2022-02-28 DIAGNOSIS — R1084 Generalized abdominal pain: Secondary | ICD-10-CM | POA: Diagnosis not present

## 2022-02-28 DIAGNOSIS — N83201 Unspecified ovarian cyst, right side: Secondary | ICD-10-CM | POA: Diagnosis not present

## 2022-02-28 DIAGNOSIS — R112 Nausea with vomiting, unspecified: Secondary | ICD-10-CM | POA: Diagnosis not present

## 2022-02-28 NOTE — Telephone Encounter (Signed)
Left message for the pt to call the office and ask to s/w Doylene Canning, EP scheduler or pre op team for IN OFFICE appt with EP.

## 2022-03-01 ENCOUNTER — Encounter: Payer: Self-pay | Admitting: Internal Medicine

## 2022-03-01 DIAGNOSIS — N83201 Unspecified ovarian cyst, right side: Secondary | ICD-10-CM | POA: Diagnosis not present

## 2022-03-01 DIAGNOSIS — R109 Unspecified abdominal pain: Secondary | ICD-10-CM | POA: Diagnosis not present

## 2022-03-02 NOTE — Telephone Encounter (Signed)
RE: PRE OP APPT PLEASE Received: Asencion Gowda, [address] CMA This pt has been contacted several times to schedule appt for pre op clearance. No answer or return call. I have mailed pt a letter and sent letter through [facility] asking for a call.   Thanks!           I will update the requesting provider that we have tried to reach the pt to schedule an appt and she has not returned our call. A letter was sent out to the pt to call for appt.in the meantime I will remove from the pre op call back pool and will re-address once the pt calls back for an appt.

## 2022-03-08 ENCOUNTER — Telehealth: Payer: Self-pay | Admitting: Internal Medicine

## 2022-03-08 NOTE — Telephone Encounter (Signed)
Pt returning call regarding Pre Op appt. Please advise. 

## 2022-03-08 NOTE — Telephone Encounter (Signed)
I will have EP scheduler [place] reach out to the pt with an appt. Looks like the pt is seen in our [place] office.

## 2022-03-08 NOTE — Telephone Encounter (Signed)
Pt has appt 03/09/22 wuith Dr. Caryl Comes for pre op clearance

## 2022-03-09 ENCOUNTER — Emergency Department: Payer: Medicaid Other

## 2022-03-09 ENCOUNTER — Emergency Department
Admission: EM | Admit: 2022-03-09 | Discharge: 2022-03-09 | Disposition: A | Discharge status: Home / Self Care | Payer: Medicaid Other | Attending: Emergency Medicine | Admitting: Emergency Medicine

## 2022-03-09 ENCOUNTER — Ambulatory Visit: Payer: Medicaid Other | Admitting: Internal Medicine

## 2022-03-09 DIAGNOSIS — E119 Type 2 diabetes mellitus without complications: Secondary | ICD-10-CM | POA: Diagnosis not present

## 2022-03-09 DIAGNOSIS — R102 Pelvic and perineal pain: Secondary | ICD-10-CM | POA: Diagnosis not present

## 2022-03-09 DIAGNOSIS — Z9049 Acquired absence of other specified parts of digestive tract: Secondary | ICD-10-CM | POA: Diagnosis not present

## 2022-03-09 DIAGNOSIS — R112 Nausea with vomiting, unspecified: Secondary | ICD-10-CM | POA: Diagnosis not present

## 2022-03-09 DIAGNOSIS — N898 Other specified noninflammatory disorders of vagina: Secondary | ICD-10-CM | POA: Diagnosis not present

## 2022-03-09 DIAGNOSIS — Z87442 Personal history of urinary calculi: Secondary | ICD-10-CM | POA: Diagnosis not present

## 2022-03-09 DIAGNOSIS — I509 Heart failure, unspecified: Secondary | ICD-10-CM | POA: Diagnosis not present

## 2022-03-09 DIAGNOSIS — Z90722 Acquired absence of ovaries, bilateral: Secondary | ICD-10-CM | POA: Diagnosis not present

## 2022-03-09 LAB — COMPREHENSIVE METABOLIC PANEL
ALT: 71 U/L — ABNORMAL HIGH (ref 0–44)
AST: 33 U/L (ref 15–41)
Albumin: 4 g/dL (ref 3.5–5.0)
Alkaline Phosphatase: 74 U/L (ref 38–126)
Anion gap: 8 (ref 5–15)
BUN: 11 mg/dL (ref 6–20)
CO2: 27 mmol/L (ref 22–32)
Calcium: 9.5 mg/dL (ref 8.9–10.3)
Chloride: 102 mmol/L (ref 98–111)
Creatinine, Ser: 0.66 mg/dL (ref 0.44–1.00)
GFR, Estimated: >60 mL/min (ref >60)
Glucose, Bld: 126 mg/dL — ABNORMAL HIGH (ref 70–99)
Potassium: 3.9 mmol/L (ref 3.5–5.1)
Sodium: 137 mmol/L (ref 135–145)
Total Bilirubin: 0.7 mg/dL (ref 0.3–1.2)
Total Protein: 7.4 g/dL (ref 6.5–8.1)

## 2022-03-09 LAB — CHLAMYDIA/NGC RT PCR (ARMC ONLY)
Chlamydia Tr: NOT DETECTED
N gonorrhoeae: NOT DETECTED

## 2022-03-09 LAB — CBC
HCT: 43.5 % (ref 36.0–46.0)
Hemoglobin: 14.5 g/dL (ref 12.0–15.0)
MCH: 28.8 pg (ref 26.0–34.0)
MCHC: 33.3 g/dL (ref 30.0–36.0)
MCV: 86.3 fL (ref 80.0–100.0)
Platelets: 370 10*3/uL (ref 150–400)
RBC: 5.04 MIL/uL (ref 3.87–5.11)
RDW: 12.4 % (ref 11.5–15.5)
WBC: 6.7 10*3/uL (ref 4.0–10.5)
nRBC: 0 % (ref 0.0–0.2)

## 2022-03-09 LAB — LIPASE, BLOOD: Lipase: 36 U/L (ref 11–51)

## 2022-03-09 LAB — URINALYSIS, ROUTINE W REFLEX MICROSCOPIC
Bilirubin Urine: NEGATIVE
Glucose, UA: NEGATIVE mg/dL
Hgb urine dipstick: NEGATIVE
Ketones, ur: NEGATIVE mg/dL
Nitrite: NEGATIVE
Protein, ur: 100 mg/dL — AB
Specific Gravity, Urine: 1.023 (ref 1.005–1.030)
pH: 6 (ref 5.0–8.0)

## 2022-03-09 LAB — WET PREP, GENITAL
Clue Cells Wet Prep HPF POC: NONE SEEN
Sperm: NONE SEEN
Trich, Wet Prep: NONE SEEN
WBC, Wet Prep HPF POC: <10 (ref <10)
Yeast Wet Prep HPF POC: NONE SEEN

## 2022-03-09 LAB — POC URINE PREG, ED: Preg Test, Ur: NEGATIVE

## 2022-03-09 IMAGING — MR MR PELVIS WO/W CM
19 of 20 series · 44 of 48 positions shown · IV contrast (10ml Gadavist)
Comparison: No recent imaging for comparison. Most recent
cross-sectional imaging study is from [DATE].

CLINICAL DATA: 25-year-old female with history of kidney stones and
reported prior LEFT oophorectomy and cholecystectomy presents with
pelvic pain worse on the RIGHT on and off for 3 weeks. Reportedly
with recent outside imaging which was unrevealing.

EXAM:
MRI PELVIS WITHOUT AND WITH CONTRAST
TECHNIQUE: Multiplanar multisequence MR imaging of the pelvis was performed
both before and after administration of intravenous contrast.
CONTRAST:  10mL GADAVIST GADOBUTROL 1 MMOL/ML IV SOLN

[Series 2: T2 · coronal · 5.0mm · 1.56mm/px · 1 of 39 slices shown (1 of 5)]
[im 1/39]
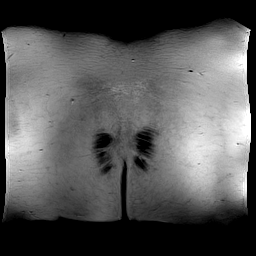

[Series 3: T2 · sagittal · 5.0mm · 0.75mm/px · 1 of 33 slices shown (2 of 5)]
[im 1/33]
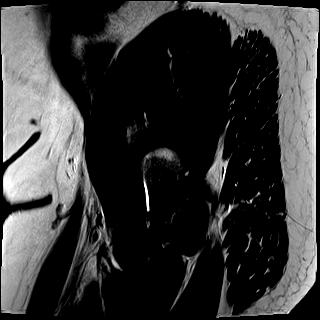

[Series 4: T2 · axial · 5.0mm · 0.47mm/px · 1 of 30 slices shown (3 of 5)]
[im 1/30]
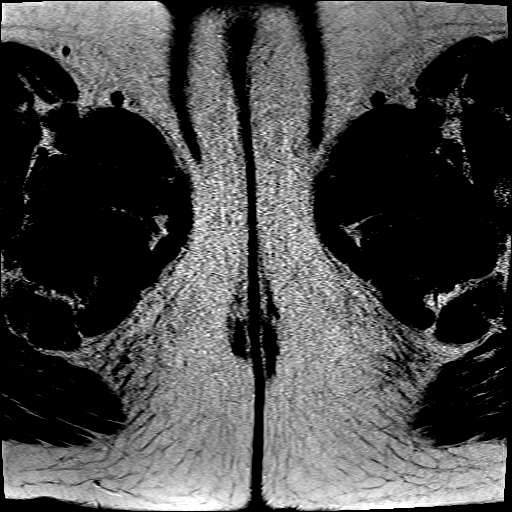

[Series 6: T2 · axial · 5.0mm · 0.47mm/px · 1 of 30 slices shown (4 of 5)]
[im 1/30]
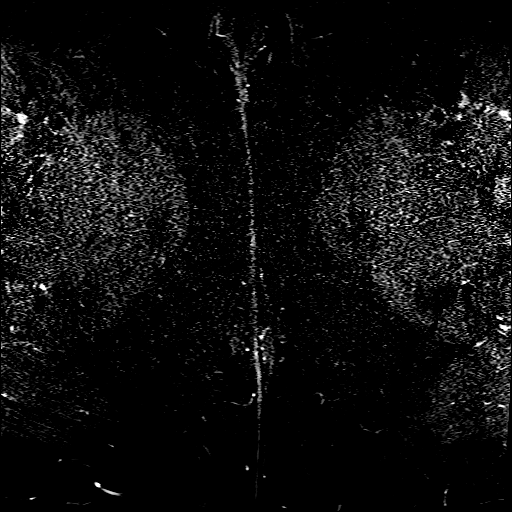

[Series 7: T2 · coronal · 5.0mm · 0.88mm/px · 1 of 33 slices shown (5 of 5)]
[im 1/33]
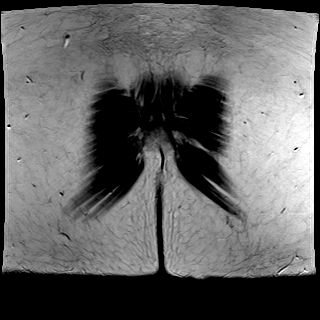

[Series 8: ax dwi_tracew · axial · 5.0mm · 1.42mm/px · z∈[-92,+106]mm · 4 of 102 slices shown]
[im 1/102]
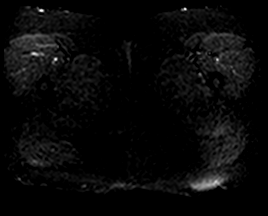
[im 34/102]
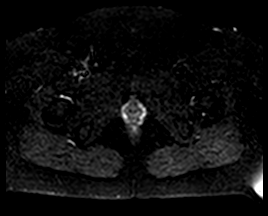
[im 68/102]
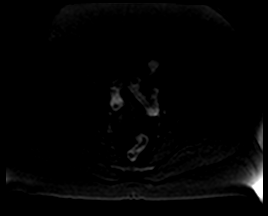
[im 102/102]
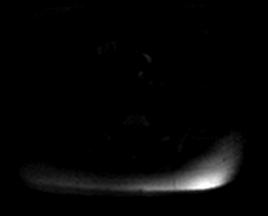

[Series 9: ax dwi_adc · axial · 5.0mm · 1.42mm/px · 1 of 34 slices shown]
[im 1/34]
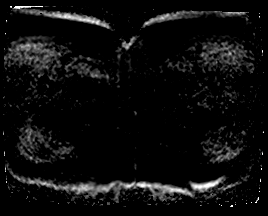

[Series 10: ax dwi_calc_bval · axial · 5.0mm · 1.42mm/px · z∈[-92,+106]mm · 2 of 34 slices shown]
[im 1/34]
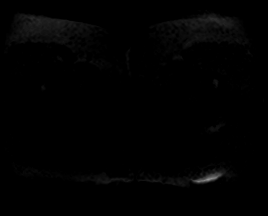
[im 34/34]
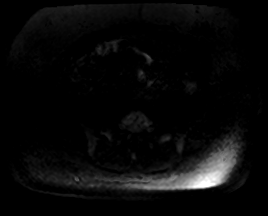

[Series 11: T1 dynamic · axial · 4.0mm · 0.75mm/px · z∈[-95,+109]mm · 3 of 52 slices shown (1 of 9)]
[im 1/52]
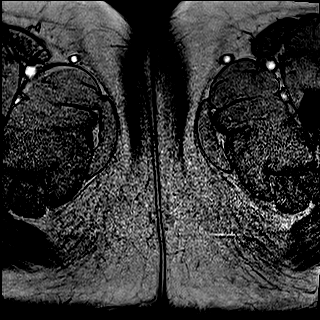
[im 26/52]
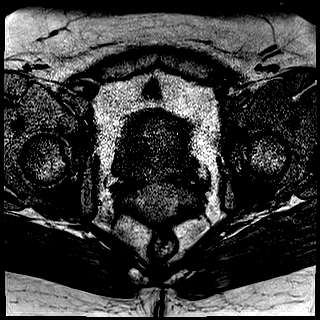
[im 52/52]
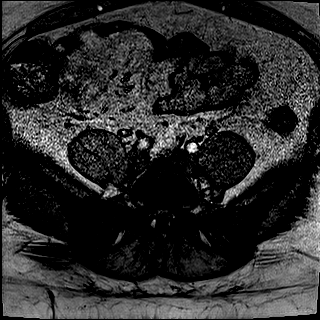

[Series 12: T1 dynamic · axial · 4.0mm · 0.75mm/px · z∈[-95,+109]mm · 3 of 52 slices shown (2 of 9)]
[im 1/52]
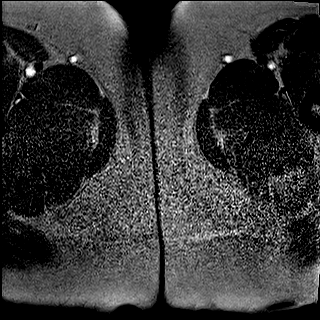
[im 26/52]
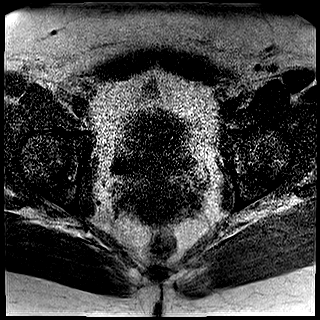
[im 52/52]
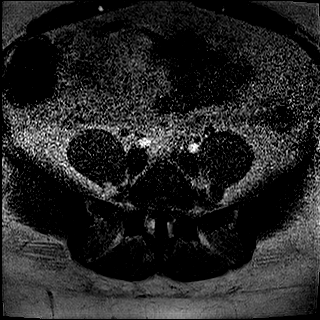

[Series 14: T1 dynamic · axial · 4.0mm · 0.75mm/px · z∈[-95,+109]mm · 3 of 52 slices shown (3 of 9)]
[im 1/52]
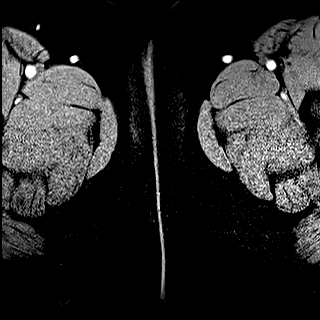
[im 26/52]
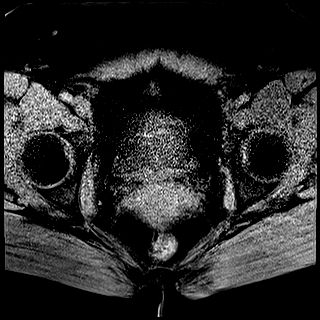
[im 52/52]
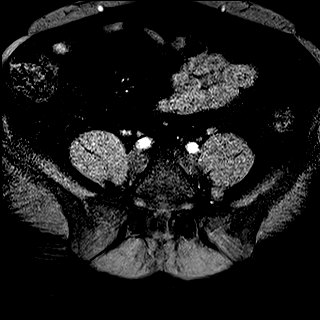

[Series 21: T1 dynamic · axial · 4.0mm · 0.75mm/px · z∈[-95,+109]mm · 3 of 52 slices shown (4 of 9)]
[im 1/52]
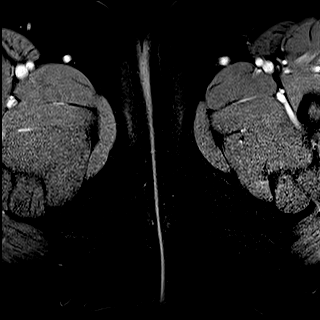
[im 26/52]
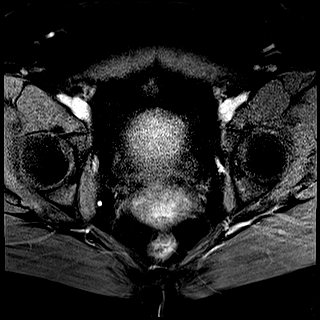
[im 52/52]
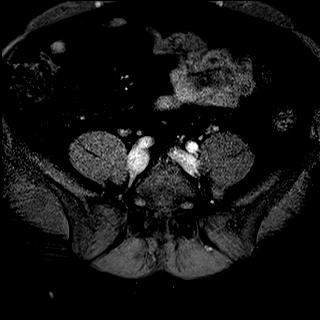

[Series 22: T1 dynamic · axial · 4.0mm · 0.75mm/px · z∈[-95,+109]mm · 3 of 52 slices shown (5 of 9)]
[im 1/52]
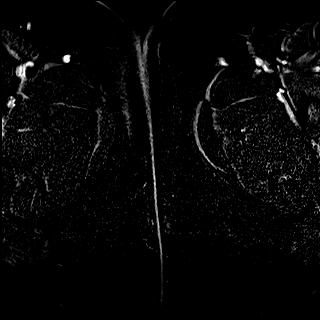
[im 26/52]
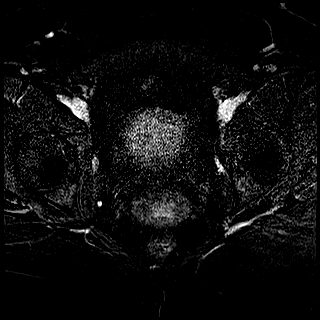
[im 52/52]
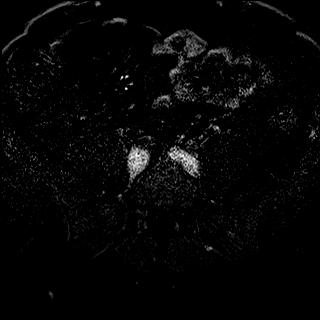

[Series 29: T1 dynamic · axial · 4.0mm · 0.75mm/px · z∈[-95,+109]mm · 3 of 52 slices shown (6 of 9)]
[im 1/52]
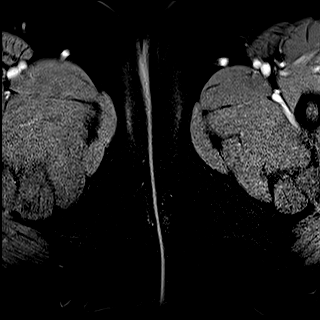
[im 26/52]
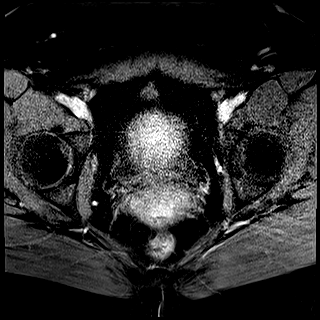
[im 52/52]
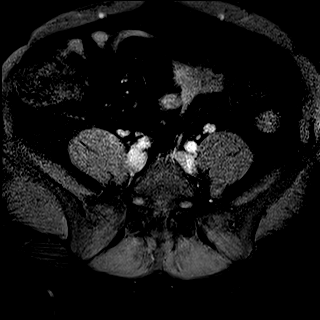

[Series 30: T1 dynamic · axial · 4.0mm · 0.75mm/px · z∈[-95,+109]mm · 3 of 52 slices shown (7 of 9)]
[im 1/52]
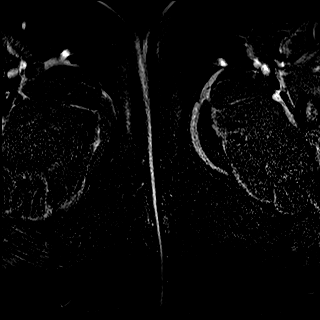
[im 26/52]
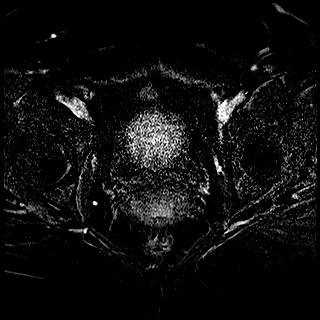
[im 52/52]
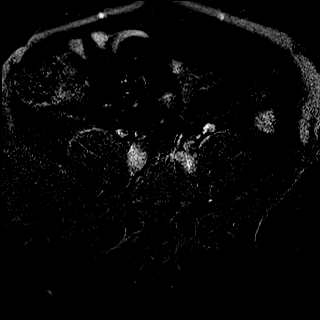

[Series 37: T1 dynamic · axial · 4.0mm · 0.75mm/px · z∈[-95,+109]mm · 3 of 52 slices shown (8 of 9)]
[im 1/52]
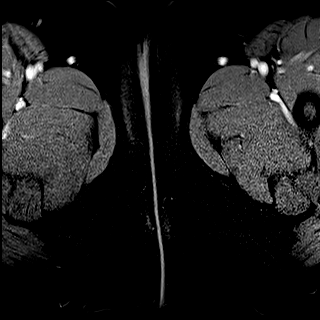
[im 26/52]
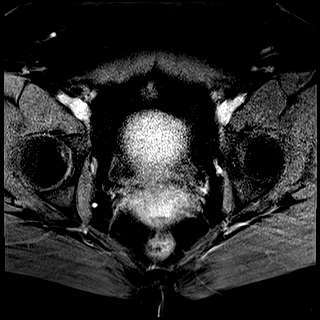
[im 52/52]
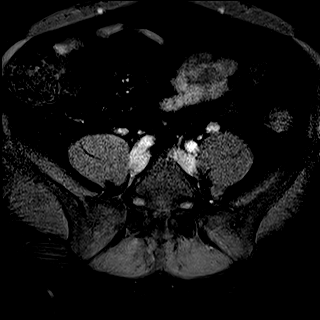

[Series 38: T1 dynamic · axial · 4.0mm · 0.75mm/px · z∈[-95,+109]mm · 3 of 52 slices shown (9 of 9)]
[im 1/52]
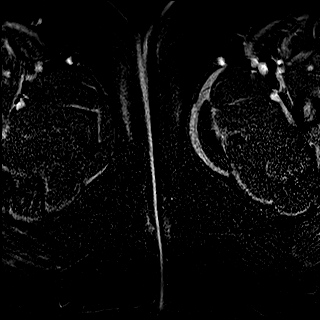
[im 26/52]
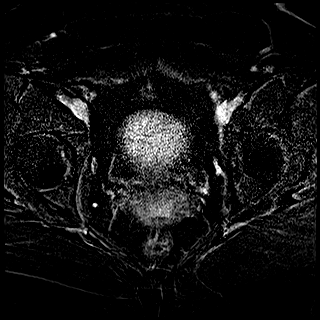
[im 52/52]
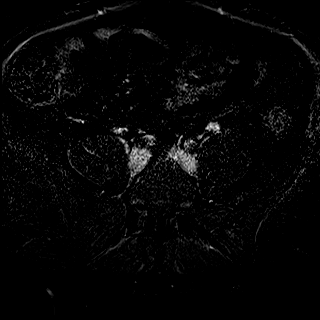

[Series 41: T1 fat-sat post-contrast · sagittal · 3.0mm · 0.88mm/px · 3 of 72 slices shown]
[im 1/72]
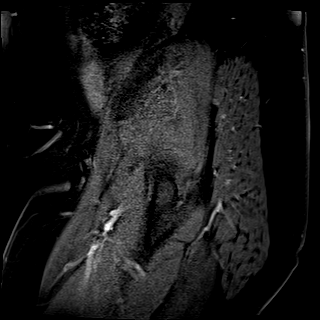
[im 36/72]
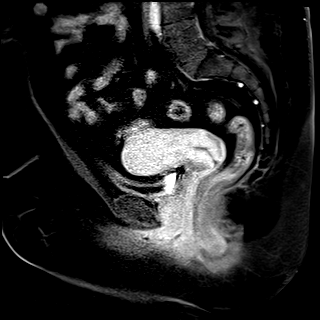
[im 72/72]
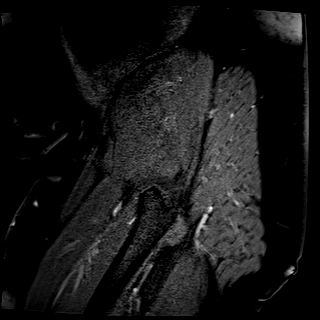

[Series 42: T2 post-contrast · sagittal · 5.0mm · 0.75mm/px · 2 of 33 slices shown]
[im 1/33]
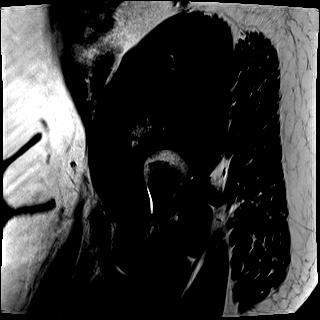
[im 33/33]
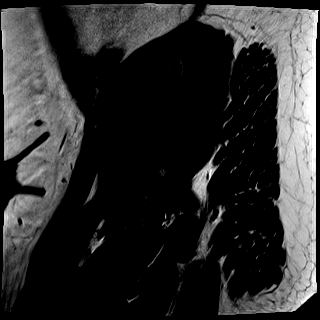

[44 of 48 positions shown; findings below may reference images not displayed]

FINDINGS: Urinary Tract: Urinary bladder is collapsed with no signs of distal
ureteral dilation or perivesical stranding.

Bowel: Visualized gastrointestinal tract is unremarkable to the
extent evaluated on pelvic MRI.

Vascular/Lymphatic: No pathologically enlarged lymph nodes. No
significant vascular abnormality seen.

Reproductive: The uterus is unremarkable. Bilateral ovaries are
normal size without adjacent stranding. There are multiple small
follicles in the bilateral ovaries. The ovaries are not enlarged.

Other:  No ascites.

Musculoskeletal: No suspicious bone lesions identified.
IMPRESSION: 1. No acute findings in the pelvis. Note that bilateral ovaries are
identified in this patient who reportedly has had prior
oophorectomy. Correlate with clinical history.
2. Multiple small follicles in the bilateral ovaries. The ovaries
are not enlarged. Findings may be related to polycystic ovarian
syndrome. Clinical correlation may be helpful.
3. No acute findings in the pelvis.

## 2022-03-09 MED ORDER — ONDANSETRON HCL 4 MG/2ML IJ SOLN
4.0000 mg | Freq: Once | INTRAMUSCULAR | Status: AC
  Administered 2022-03-09: 4 mg via INTRAVENOUS
  Filled 2022-03-09: qty 2

## 2022-03-09 MED ORDER — GADOBUTROL 1 MMOL/ML IV SOLN
10.0000 mL | Freq: Once | INTRAVENOUS | Status: AC | PRN
  Administered 2022-03-09: 10 mL via INTRAVENOUS

## 2022-03-09 MED ORDER — SODIUM CHLORIDE 0.9 % IV BOLUS
1000.0000 mL | Freq: Once | INTRAVENOUS | Status: AC
Start: 2022-03-09 — End: 2022-03-09
  Administered 2022-03-09: 1000 mL via INTRAVENOUS

## 2022-03-09 MED ORDER — HYDROMORPHONE HCL 1 MG/ML IJ SOLN
1.0000 mg | Freq: Once | INTRAMUSCULAR | Status: AC
  Administered 2022-03-09: 1 mg via INTRAVENOUS
  Filled 2022-03-09: qty 1

## 2022-03-09 MED ORDER — LORAZEPAM 2 MG/ML IJ SOLN
2.0000 mg | Freq: Once | INTRAMUSCULAR | Status: AC
  Administered 2022-03-09: 2 mg via INTRAVENOUS
  Filled 2022-03-09: qty 1

## 2022-03-09 MED ORDER — KETOROLAC TROMETHAMINE 30 MG/ML IJ SOLN
15.0000 mg | Freq: Once | INTRAMUSCULAR | Status: AC
  Administered 2022-03-09: 15 mg via INTRAVENOUS
  Filled 2022-03-09: qty 1

## 2022-03-09 NOTE — ED Notes (Signed)
Pelvic cart to bedside. Pt up to restroom. Visitor remains at bedside. Will place IV once pt back to room.

## 2022-03-09 NOTE — ED Triage Notes (Signed)
Pt reports hx of kidney stones and an ovarian cyst. Pt states this is her 4th ER visit in 3 weeks, pt states that she feels awful. Pt reports that she has been taking the pain medication without relief and states that she has been vomiting related to the pain

## 2022-03-09 NOTE — ED Provider Notes (Signed)
[facility] Provider Note    Event Date/Time   First MD Initiated Contact with Patient 03/09/22 1401     (approximate)   History   Chief Complaint: Abdominal Pain and Flank Pain   HPI  Sandra Gibbs is a 26 y.o. female with a past history of diabetes, ADHD, anxiety, CHF, kidney stones, prior left oophorectomy and cholecystectomy who comes ED complaining of pelvic pain, worse on the right-hand side, off and on for the past 3 weeks.  Currently severe.  Radiates to her back.  No aggravating or alleviating factors.  Associated with nausea and vomiting.  Also has some scant vaginal discharge.  No vomiting constipation or diarrhea.  No fever.  No trauma.  Reviewed outside records which show that over the past few months she has had renal ultrasound, pelvic ultrasound, and 2 noncontrast CT scans of the abdomen and pelvis in the [facility].  These were all unremarkable except for showing small right-sided ovarian cyst.  Pelvic ultrasound showed normal blood flow to the ovary.     Physical Exam   Triage Vital Signs: ED Triage Vitals  Enc Vitals Group     BP 03/09/22 1357 110/89     Pulse Rate 03/09/22 1357 91     Resp 03/09/22 1357 16     Temp 03/09/22 1357 98.4 F (36.9 C)     Temp Source 03/09/22 1357 Oral     SpO2 03/09/22 1357 98 %     Weight 03/09/22 1049 220 lb (99.8 kg)     Height 03/09/22 1049 5' (1.524 m)     Head Circumference --      Peak Flow --      Pain Score 03/09/22 1049 8     Pain Loc --      Pain Edu? --      Excl. in [place]? --     Most recent vital signs: Vitals:   03/09/22 1357 03/09/22 1445  BP: 110/89   Pulse: 91 80  Resp: 16   Temp: 98.4 F (36.9 C)   SpO2: 98%     General: Awake, no distress.  CV:  Good peripheral perfusion.  Resp:  Normal effort.  Abd:  No distention.  Soft with diffuse lower abdominal tenderness, worse in the right suprapubic area.  No focal tenderness at McBurney's point. Other:  Moist oral mucosa.   Pelvic exam shows normal external genitalia.  Speculum exam reveals thin white discharge,  physiologic appearance.  No CMT.   ED Results / Procedures / Treatments   Labs (all labs ordered are listed, but only abnormal results are displayed) Labs Reviewed  COMPREHENSIVE METABOLIC PANEL - Abnormal; Notable for the following components:      Result Value   Glucose, Bld 126 (*)    ALT 71 (*)    All other components within normal limits  URINALYSIS, ROUTINE W REFLEX MICROSCOPIC - Abnormal; Notable for the following components:   Color, Urine YELLOW (*)    APPearance HAZY (*)    Protein, ur 100 (*)    Leukocytes,Ua TRACE (*)    Bacteria, UA RARE (*)    All other components within normal limits  WET PREP, GENITAL  CHLAMYDIA/NGC RT PCR ([facility] ONLY)            LIPASE, BLOOD  CBC  POC URINE PREG, ED     EKG    RADIOLOGY X-ray abdomen from July 01, 2021 independently reviewed and interpreted by me, appears normal.  Radiology report reviewed.  MRI pelvis pending.   PROCEDURES:  Procedures   MEDICATIONS ORDERED IN ED: Medications  LORazepam (ATIVAN) injection 2 mg (has no administration in time range)  sodium chloride 0.9 % bolus 1,000 mL (1,000 mLs Intravenous New Bag/Given 03/09/22 1510)  HYDROmorphone (DILAUDID) injection 1 mg (1 mg Intravenous Given 03/09/22 1508)  ondansetron (ZOFRAN) injection 4 mg (4 mg Intravenous Given 03/09/22 1506)     IMPRESSION / MDM / ASSESSMENT AND PLAN / ED COURSE  I reviewed the triage vital signs and the nursing notes.                              Differential diagnosis includes, but is not limited to, ovarian torsion, appendicitis, cystitis, ureterolithiasis, ruptured ovarian cyst, TOA  Patient's presentation is most consistent with acute presentation with potential threat to life or bodily function.  Patient presents with recurrent, severe pelvic pain.  Pelvic exam completed to evaluate for STI.  Patient has already had ultrasounds  and CT imaging previously.  I do not think this is vascular such as dissection or mesenteric ischemia, so I do not think a CT angiogram will be helpful.  Will obtain MRI pelvis to evaluate possibility (though low likelihood) of ovarian torsion.  If work-up is unremarkable, I think patient will not require admission and can be discharged home with supportive care.  She has no fever, no leukocytosis and I do not think that she has PID .       FINAL CLINICAL IMPRESSION(S) / ED DIAGNOSES   Final diagnoses:  Pelvic pain     Rx / DC Orders   ED Discharge Orders     None        Note:  This document was prepared using Dragon voice recognition software and may include unintentional dictation errors.   Sharman Cheek, MD 03/09/22 [phone]

## 2022-03-09 NOTE — ED Notes (Signed)
This RN at bedside with EDP Scotty Court during pelvic exam; pt's visitor remained at bedside.

## 2022-03-09 NOTE — ED Notes (Signed)
Pt tearful; visitor trying to comfort her; when asked, pt reports is stressed post MRI. States she felt like MRI machine "shook" her.

## 2022-03-09 NOTE — ED Notes (Addendum)
Pt c/o BL flank and lower abd pain for the past 20 days and has had 4 ER visits, pt has chronic kidney disease and has been dx with a right and left ovarian cyst 5/19 and 5/23 at [facility]. States she has pain meds but is just miserable

## 2022-03-09 NOTE — ED Notes (Signed)
Called MRI staff to determine when to give ativan; MRI staff states they'll call [employer] staff to give ativan when they are ready as currently backed up with imaging.

## 2022-03-10 ENCOUNTER — Encounter: Payer: Self-pay | Admitting: *Deleted

## 2022-03-10 ENCOUNTER — Other Ambulatory Visit: Payer: Self-pay

## 2022-03-10 ENCOUNTER — Emergency Department
Admission: EM | Admit: 2022-03-10 | Discharge: 2022-03-10 | Disposition: A | Discharge status: Home / Self Care | Payer: Medicaid Other | Attending: Emergency Medicine | Admitting: Emergency Medicine

## 2022-03-10 ENCOUNTER — Telehealth: Payer: Self-pay

## 2022-03-10 DIAGNOSIS — R300 Dysuria: Secondary | ICD-10-CM | POA: Diagnosis not present

## 2022-03-10 DIAGNOSIS — R109 Unspecified abdominal pain: Secondary | ICD-10-CM | POA: Diagnosis present

## 2022-03-10 DIAGNOSIS — R1110 Vomiting, unspecified: Secondary | ICD-10-CM | POA: Diagnosis not present

## 2022-03-10 DIAGNOSIS — N3000 Acute cystitis without hematuria: Secondary | ICD-10-CM | POA: Diagnosis not present

## 2022-03-10 DIAGNOSIS — R102 Pelvic and perineal pain: Secondary | ICD-10-CM | POA: Diagnosis not present

## 2022-03-10 LAB — CBC
HCT: 44.5 % (ref 36.0–46.0)
Hemoglobin: 14.7 g/dL (ref 12.0–15.0)
MCH: 28.8 pg (ref 26.0–34.0)
MCHC: 33 g/dL (ref 30.0–36.0)
MCV: 87.3 fL (ref 80.0–100.0)
Platelets: 322 10*3/uL (ref 150–400)
RBC: 5.1 MIL/uL (ref 3.87–5.11)
RDW: 12.1 % (ref 11.5–15.5)
WBC: 6.2 10*3/uL (ref 4.0–10.5)
nRBC: 0 % (ref 0.0–0.2)

## 2022-03-10 LAB — BASIC METABOLIC PANEL
Anion gap: 7 (ref 5–15)
BUN: 12 mg/dL (ref 6–20)
CO2: 25 mmol/L (ref 22–32)
Calcium: 9.2 mg/dL (ref 8.9–10.3)
Chloride: 106 mmol/L (ref 98–111)
Creatinine, Ser: 0.71 mg/dL (ref 0.44–1.00)
GFR, Estimated: >60 mL/min (ref >60)
Glucose, Bld: 112 mg/dL — ABNORMAL HIGH (ref 70–99)
Potassium: 4.3 mmol/L (ref 3.5–5.1)
Sodium: 138 mmol/L (ref 135–145)

## 2022-03-10 LAB — URINALYSIS, ROUTINE W REFLEX MICROSCOPIC
Bilirubin Urine: NEGATIVE
Glucose, UA: NEGATIVE mg/dL
Hgb urine dipstick: NEGATIVE
Ketones, ur: NEGATIVE mg/dL
Nitrite: NEGATIVE
Protein, ur: 30 mg/dL — AB
Specific Gravity, Urine: 1.032 — ABNORMAL HIGH (ref 1.005–1.030)
Squamous Epithelial / HPF: >50 — ABNORMAL HIGH (ref 0–5)
WBC, UA: >50 WBC/hpf — ABNORMAL HIGH (ref 0–5)
pH: 6 (ref 5.0–8.0)

## 2022-03-10 LAB — POC URINE PREG, ED: Preg Test, Ur: NEGATIVE

## 2022-03-10 MED ORDER — DROPERIDOL 2.5 MG/ML IJ SOLN
2.5000 mg | Freq: Once | INTRAMUSCULAR | Status: AC
  Administered 2022-03-10: 2.5 mg via INTRAVENOUS
  Filled 2022-03-10: qty 2

## 2022-03-10 MED ORDER — SODIUM CHLORIDE 0.9 % IV BOLUS: 1000.0000 mL | Freq: Once | INTRAVENOUS | Status: DC

## 2022-03-10 MED ORDER — SODIUM CHLORIDE 0.9 % IV SOLN
1.0000 g | Freq: Once | INTRAVENOUS | Status: AC
  Administered 2022-03-10: 1 g via INTRAVENOUS
  Filled 2022-03-10: qty 10

## 2022-03-10 MED ORDER — SODIUM CHLORIDE 0.9 % IV BOLUS
1000.0000 mL | Freq: Once | INTRAVENOUS | Status: AC
  Administered 2022-03-10: 1000 mL via INTRAVENOUS

## 2022-03-10 MED ORDER — LACTATED RINGERS IV BOLUS: 1000.0000 mL | Freq: Once | INTRAVENOUS | Status: DC

## 2022-03-10 MED ORDER — HYDROMORPHONE HCL 1 MG/ML IJ SOLN
1.0000 mg | Freq: Once | INTRAMUSCULAR | Status: AC
  Administered 2022-03-10: 1 mg via INTRAVENOUS
  Filled 2022-03-10: qty 1

## 2022-03-10 MED ORDER — ONDANSETRON HCL 4 MG/2ML IJ SOLN
4.0000 mg | Freq: Once | INTRAMUSCULAR | Status: DC
Start: 2022-03-10 — End: 2022-03-10

## 2022-03-10 MED ORDER — CEPHALEXIN 500 MG PO CAPS: 500.0000 mg | ORAL_CAPSULE | Freq: Three times a day (TID) | ORAL | 0 refills | Status: AC

## 2022-03-10 NOTE — ED Provider Triage Note (Signed)
Emergency Medicine Provider Triage Evaluation Note  Sandra Gibbs , a 26 y.o. female  was evaluated in triage.  Pt complains of right lower quadrant abdominal pelvic pain.  Seen yesterday for same issue, had MRI of the pelvis showing no acute findings.  Normal laboratory findings.  She was given pain medication, fluids and nausea medication and was feeling good at time of discharge, today she has had increasing pain with persistent nausea vomiting and similar symptoms to yesterday..  Review of Systems  Positive: Right lower quadrant pelvic pain, nausea, vomiting Negative: Chest pain, shortness of breath or fevers  Physical Exam  BP 132/85 (BP Location: Left Arm)   Pulse 95   Temp 99.1 F (37.3 C) (Oral)   Resp 18   SpO2 96%  Gen:   Awake, no distress   Resp:  Normal effort  MSK:   Moves extremities without difficulty  Other:    Medical Decision Making  Medically screening exam initiated at 3:58 PM.  Appropriate orders placed.  Sandra Gibbs was informed that the remainder of the evaluation will be completed by another provider, this initial triage assessment does not replace that evaluation, and the importance of remaining in the ED until their evaluation is complete.     Evon Slack, PA-C 03/10/22 1601

## 2022-03-10 NOTE — Telephone Encounter (Signed)
Transition Care Management Unsuccessful Follow-up Telephone Call  Date of discharge and from where:  03/09/2022-[facility]  Attempts:  1st Attempt  Reason for unsuccessful TCM follow-up call:  Left voice message

## 2022-03-10 NOTE — ED Provider Notes (Signed)
[facility] Provider Note    Event Date/Time   First MD Initiated Contact with Patient 03/10/22 1657     (approximate)   History   Emesis and Abdominal Pain   HPI  Sandra Gibbs is a 26 y.o. female who presents to the ED for evaluation of Emesis and Abdominal Pain   I reviewed recurrent ED visits.  This is her fifth in the past 3 weeks, all for similar abdominal symptoms.  She was seen yesterday by one of my colleagues for the same.  She had an MRI of her pelvis with and without contrast without acute features. Last diagnosed with acute cystitis on 5/18 and discharged with 10 days of cefdinir.  This was in the [facility] healthcare system, urine culture with mixed urogenital flora.  Patient presents to the ED for evaluation of "I cannot keep anything down."  She reports developing dysuria just this morning and has been present throughout the day today.  Reports chronic midline and RLQ abdominal pain alongside this.  She reports frustration with her situation and recurrent ED visits.  Reports having a pain management doctor, but not a pain contract.  Reports when she was last diagnosed with acute cystitis, she only took 2 or 3 days of antibiotics and then discontinued because she was seen again after a few days and the urine culture was negative so she was told to stop her cefdinir.  Physical Exam   Triage Vital Signs: ED Triage Vitals  Enc Vitals Group     BP 03/10/22 1551 132/85     Pulse Rate 03/10/22 1551 95     Resp 03/10/22 1551 18     Temp 03/10/22 1551 99.1 F (37.3 C)     Temp Source 03/10/22 1551 Oral     SpO2 03/10/22 1551 96 %     Weight 03/10/22 1558 220 lb (99.8 kg)     Height 03/10/22 1558 5' (1.524 m)     Head Circumference --      Peak Flow --      Pain Score 03/10/22 1558 8     Pain Loc --      Pain Edu? --      Excl. in [place]? --     Most recent vital signs: Vitals:   03/10/22 1551  BP: 132/85  Pulse: 95  Resp: 18  Temp: 99.1 F  (37.3 C)  SpO2: 96%    General: Awake, no distress.  Obese.  Well-appearing.  Sitting up in bed with cross legs. CV:  Good peripheral perfusion.  Resp:  Normal effort.  Abd:  No distention.  Diffuse tenderness without localizing or peritoneal features.  No guarding. MSK:  No deformity noted.  Neuro:  No focal deficits appreciated. Other:     ED Results / Procedures / Treatments   Labs (all labs ordered are listed, but only abnormal results are displayed) Labs Reviewed  BASIC METABOLIC PANEL - Abnormal; Notable for the following components:      Result Value   Glucose, Bld 112 (*)    All other components within normal limits  URINALYSIS, ROUTINE W REFLEX MICROSCOPIC - Abnormal; Notable for the following components:   Color, Urine YELLOW (*)    APPearance CLOUDY (*)    Specific Gravity, Urine 1.032 (*)    Protein, ur 30 (*)    Leukocytes,Ua MODERATE (*)    WBC, UA >50 (*)    Bacteria, UA MANY (*)    Squamous Epithelial /  LPF >50 (*)    All other components within normal limits  URINE CULTURE  CBC  POC URINE PREG, ED    EKG   RADIOLOGY   Official radiology report(s): No results found.  PROCEDURES and INTERVENTIONS:  Procedures  Medications  HYDROmorphone (DILAUDID) injection 1 mg (1 mg Intravenous Given 03/10/22 1749)  droperidol (INAPSINE) 2.5 MG/ML injection 2.5 mg (2.5 mg Intravenous Given 03/10/22 1747)  cefTRIAXone (ROCEPHIN) 1 g in sodium chloride 0.9 % 100 mL IVPB (0 g Intravenous Stopped 03/10/22 1856)  sodium chloride 0.9 % bolus 1,000 mL (1,000 mLs Intravenous New Bag/Given 03/10/22 1747)     IMPRESSION / MDM / ASSESSMENT AND PLAN / ED COURSE  I reviewed the triage vital signs and the nursing notes.  Differential diagnosis includes, but is not limited to, acute cystitis, ureterolithiasis, ovarian torsion, appendicitis  {Patient presents with symptoms of an acute illness or injury that is potentially life-threatening.  26 year old female presents to the  ED with chronic abdominal/pelvic pain with superimposed acute emesis and dysuria, with evidence of acute cystitis suitable for outpatient management.  Pain is controlled with single round of IV analgesic medications, subsequently tolerated p.o. intake.  Blood work is reassuring with normal CBC and metabolic panel.  UA is clouded by many squamous cells, but the moderate leukocytes and her symptoms are concerning for the possibility of cystitis.  We will send this for culture and start her empirically on antibiotics.  We will discharge with return precautions.  I considered observation admission for this patient, but ultimately not necessary.  Clinical Course as of 03/10/22 1859  Thu Mar 10, 2022  1841 Reassessed.  Feeling better. [DS]  [id number] Patient requesting something to drink.  We will perform p.o. challenge with anticipation of outpatient management thereafter considering her improving symptoms and controlled pain.  No emesis for the nearly 4 hours that she has been here. [DS]    Clinical Course User Index [DS] Vladimir Crofts, MD     FINAL CLINICAL IMPRESSION(S) / ED DIAGNOSES   Final diagnoses:  Acute cystitis without hematuria  Dysuria  Pelvic pain in female     Rx / DC Orders   ED Discharge Orders          Ordered    cephALEXin (KEFLEX) 500 MG capsule  3 times daily        03/10/22 1858             Note:  This document was prepared using Dragon voice recognition software and may include unintentional dictation errors.   Vladimir Crofts, MD 03/10/22 Drema Halon

## 2022-03-10 NOTE — ED Triage Notes (Signed)
Pt reports she has an ovarian cyst and and has low abd pain.  Pt was seen here yesterday for similar sx.  Pt reports blood in emesis today.  Pt alert.

## 2022-03-11 ENCOUNTER — Telehealth: Payer: Self-pay

## 2022-03-11 LAB — URINE CULTURE: Culture: <10000 — AB

## 2022-03-11 NOTE — Telephone Encounter (Signed)
Transition Care Management Unsuccessful Follow-up Telephone Call  Date of discharge and from where:  03/10/2022-[facility]  Attempts:  1st Attempt  Reason for unsuccessful TCM follow-up call:  Left voice message    

## 2022-03-11 NOTE — Telephone Encounter (Signed)
Transition Care Management Unsuccessful Follow-up Telephone Call  Date of discharge and from where:  03/09/2022-[facility]  Attempts:  2nd Attempt  Reason for unsuccessful TCM follow-up call:  Left voice message    

## 2022-03-14 NOTE — Telephone Encounter (Signed)
Transition Care Management Unsuccessful Follow-up Telephone Call  Date of discharge and from where:  03/10/2022-[facility]  Attempts:  2nd Attempt  Reason for unsuccessful TCM follow-up call:  Left voice message    

## 2022-03-14 NOTE — Telephone Encounter (Signed)
Transition Care Management Unsuccessful Follow-up Telephone Call  Date of discharge and from where:  03/09/2022-[facility]  Attempts:  3rd Attempt  Reason for unsuccessful TCM follow-up call:  Left voice message    

## 2022-03-15 ENCOUNTER — Encounter: Payer: Self-pay | Admitting: Student

## 2022-03-15 ENCOUNTER — Ambulatory Visit (INDEPENDENT_AMBULATORY_CARE_PROVIDER_SITE_OTHER): Payer: Medicaid Other | Admitting: Student

## 2022-03-15 VITALS — BP 114/76 | HR 106 | Ht 60.0 in | Wt 233.6 lb

## 2022-03-15 DIAGNOSIS — I5181 Takotsubo syndrome: Secondary | ICD-10-CM | POA: Diagnosis not present

## 2022-03-15 DIAGNOSIS — R002 Palpitations: Secondary | ICD-10-CM

## 2022-03-15 NOTE — Progress Notes (Signed)
PCP:  Cathie Hoops, PA Primary Cardiologist: None Electrophysiologist: Sherryl Manges, MD   Sandra Gibbs is a 26 y.o. female seen today for Sherryl Manges, MD for routine electrophysiology followup.    Last seen 01/2021.  Pt had admission in 2022 for troponin positive chest pain in setting of significant stress felt to be possible Tako-tsubo. Echo showed EF ~45-50%. Cath showed normal cors with low normal EF ~50%, Follow up cMRI as outpatient unremarkable with normal EF at 59% and no scar.   Since last being seen in our clinic the patient reports doing very well. She has occasional palpitations and chest heaviness, but solely in the presence of anxiety or stress. Otherwise, she denies cardiac symptoms.  she denies dyspnea, PND, orthopnea, nausea, vomiting, dizziness, syncope, edema, weight gain, or early satiety.  Past Medical History:  Diagnosis Date   ADHD    Kidney stones    Past Surgical History:  Procedure Laterality Date   CYST REMOVAL TRUNK  2019   GALLBLADDER SURGERY  2016   KIDNEY SURGERY     LEFT HEART CATH AND CORONARY ANGIOGRAPHY N/A 01/01/2021   Procedure: LEFT HEART CATH AND CORONARY ANGIOGRAPHY;  Surgeon: Alwyn Pea, MD;  Location: [facility] INVASIVE CV LAB;  Service: Cardiovascular;  Laterality: N/A;   OVARIAN CYST SURGERY  2011   TONSILLECTOMY      Current Outpatient Medications  Medication Sig Dispense Refill   acetaminophen (TYLENOL) 325 MG tablet Take by mouth.     azithromycin (ZITHROMAX) 250 MG tablet Take 1 tablet (250 mg total) by mouth daily. Take first 2 tablets together, then 1 every day until finished. (Patient not taking: Reported on 03/09/2022) 6 tablet 0   busPIRone (BUSPAR) 30 MG tablet Take 30 mg by mouth 2 (two) times daily.     cefdinir (OMNICEF) 300 MG capsule Take by mouth. (Patient not taking: Reported on 03/09/2022)     cephALEXin (KEFLEX) 500 MG capsule Take 1 capsule (500 mg total) by mouth 3 (three) times daily for 5 days. 15  capsule 0   CONCERTA 27 MG CR tablet Take 27 mg by mouth at bedtime. (Patient not taking: Reported on 03/09/2022)     fluconazole (DIFLUCAN) 100 MG tablet Take 2 tablets (Patient not taking: Reported on 03/09/2022)     FLUoxetine (PROZAC) 20 MG capsule Take 40 mg by mouth. (Patient not taking: Reported on 03/09/2022)     guanFACINE (INTUNIV) 1 MG TB24 ER tablet  (Patient not taking: Reported on 03/09/2022)     guanFACINE (INTUNIV) 2 MG TB24 ER tablet Take 2 mg by mouth at bedtime. (Patient not taking: Reported on 03/09/2022)     hyoscyamine (LEVSIN SL) 0.125 MG SL tablet Place under the tongue.     Hyoscyamine Sulfate SL 0.125 MG SUBL Place under the tongue.     ketorolac (TORADOL) 10 MG tablet Take 10 mg by mouth 3 (three) times daily. (Patient not taking: Reported on 03/09/2022)     lidocaine (XYLOCAINE) 2 % jelly Place into the urethra. (Patient not taking: Reported on 03/09/2022)     loperamide (IMODIUM) 2 MG capsule Take 1 capsule (2 mg total) by mouth 4 (four) times daily as needed for diarrhea or loose stools. (Patient not taking: Reported on 03/09/2022) 12 capsule 0   LORazepam (ATIVAN) 1 MG tablet Take 1 mg by mouth as needed.     meloxicam (MOBIC) 15 MG tablet Take 1 tablet by mouth daily. (Patient not taking: Reported on 03/09/2022)  metFORMIN (GLUCOPHAGE) 500 MG tablet Take 1 tablet by mouth daily before breakfast. (Patient not taking: Reported on 03/09/2022)     metoprolol succinate (TOPROL-XL) 50 MG 24 hr tablet Take 50 mg by mouth daily. (Patient not taking: Reported on 03/09/2022)     mupirocin ointment (BACTROBAN) 2 % Apply topically 3 (three) times daily. (Patient not taking: Reported on 03/09/2022)     nitrofurantoin, macrocrystal-monohydrate, (MACROBID) 100 MG capsule Take 1 capsule (100 mg total) by mouth 2 (two) times daily. (Patient not taking: Reported on 03/09/2022) 10 capsule 0   ondansetron (ZOFRAN-ODT) 4 MG disintegrating tablet Take 1 tablet (4 mg total) by mouth every 8 (eight)  hours as needed for nausea or vomiting. 20 tablet 0   sodium chloride irrigation 0.9 % irrigation Irrigate with as directed. (Patient not taking: Reported on 03/09/2022)     solifenacin (VESICARE) 5 MG tablet Take 5 mg by mouth daily. (Patient not taking: Reported on 03/09/2022)     No current facility-administered medications for this visit.    Allergies  Allergen Reactions   Wound Dressing Adhesive Itching, Other (See Comments) and Rash    Other: "tears skin off" Other: "tears skin off" Other: "tears skin off"  Other reaction(s): Other (see comments) Other: "tears skin off" Other: "tears skin off" Other: "tears skin off"   Wound Dressings Itching and Rash    Other reaction(s): Other (See Comments) Other: "tears skin off" Other: "tears skin off" Other: "tears skin off"   Latex Itching   Morphine Itching and Rash    Social History   Socioeconomic History   Marital status: Married    Spouse name: Not on file   Number of children: Not on file   Years of education: Not on file   Highest education level: Not on file  Occupational History   Not on file  Tobacco Use   Smoking status: Never   Smokeless tobacco: Never  Vaping Use   Vaping Use: Never used  Substance and Sexual Activity   Alcohol use: No   Drug use: No   Sexual activity: Yes  Other Topics Concern   Not on file  Social History Narrative   Not on file   Social Determinants of Health   Financial Resource Strain: Not on file  Food Insecurity: Not on file  Transportation Needs: Not on file  Physical Activity: Not on file  Stress: Not on file  Social Connections: Not on file  Intimate Partner Violence: Not on file     Review of Systems: All other systems reviewed and are otherwise negative except as noted above.  Physical Exam: There were no vitals filed for this visit.  GEN- The patient is well appearing, alert and oriented x 3 today.   HEENT: normocephalic, atraumatic; sclera clear, conjunctiva  pink; hearing intact; oropharynx clear; neck supple, no JVP Lymph- no cervical lymphadenopathy Lungs- Clear to ausculation bilaterally, normal work of breathing.  No wheezes, rales, rhonchi Heart- Regular rate and rhythm, no murmurs, rubs or gallops, PMI not laterally displaced GI- soft, non-tender, non-distended, bowel sounds present, no hepatosplenomegaly Extremities- no clubbing, cyanosis, or edema; DP/PT/radial pulses 2+ bilaterally MS- no significant deformity or atrophy Skin- warm and dry, no rash or lesion Psych- euthymic mood, full affect Neuro- strength and sensation are intact  EKG is ordered. Personal review of EKG from today shows sinus tach at 106 bpm  Additional studies reviewed include: Previous EP office notes.   Cath 01/01/2021 Normal cors LVEF 50-55%  cMRI 02/24/2021  1.  Normal LV and RV size and function, LVEF 59%. 2.  No wall motion abnormalities noted. 3.  No evidence for late gadolinium enhancement (LGE) or scar. 4.  No evidence for infiltrative disease. 5. Normal Cardiac MRI with no evidence for myocarditis or Takotsubo cardiomyopathy.  Assessment and Plan:  1. LV dysfunction Transient and resolved on low dose BB.  Occurred with troponin + chest pain in setting of stress. Thought possible Tako-tsubo.  Follow up cMRI showed normal EF and no scar as above.   2. Palpitations Stable  3. Cardiac clearance for colonoscopy and upper endoscopy Chart reviewed and pt is at low risk with 0.4% risk of peri-operative cardiac complications by RCRI Nedra Hai(Lee criteria) She may proceed without further work up.   Follow up with Dr. Graciela HusbandsKlein in 12 months, sooner with issues.    Graciella FreerMichael Andrew Katelee Schupp, PA-C  03/15/22 10:10 AM

## 2022-03-15 NOTE — Patient Instructions (Signed)
Medication Instructions:  Your physician recommends that you continue on your current medications as directed. Please refer to the Current Medication list given to you today.  *If you need a refill on your cardiac medications before your next appointment, please call your pharmacy*   Lab Work: None If you have labs (blood work) drawn today and your tests are completely normal, you will receive your results only by: . MyChart Message (if you have MyChart) OR . A paper copy in the mail If you have any lab test that is abnormal or we need to change your treatment, we will call you to review the results.   Follow-Up: At [facility], you and your health needs are our priority.  As part of our continuing mission to provide you with exceptional heart care, we have created designated Provider Care Teams.  These Care Teams include your primary Cardiologist (physician) and Advanced Practice Providers (APPs -  Physician Assistants and Nurse Practitioners) who all work together to provide you with the care you need, when you need it.  Your next appointment:   1 year(s)  The format for your next appointment:   In Person  Provider:   Steven Klein, MD   

## 2022-03-15 NOTE — Telephone Encounter (Signed)
Transition Care Management Unsuccessful Follow-up Telephone Call  Date of discharge and from where:  03/10/2022-[facility]  Attempts:  3rd Attempt  Reason for unsuccessful TCM follow-up call:  Unable to reach patient    

## 2022-03-18 DIAGNOSIS — M549 Dorsalgia, unspecified: Secondary | ICD-10-CM | POA: Diagnosis not present

## 2022-03-18 DIAGNOSIS — R109 Unspecified abdominal pain: Secondary | ICD-10-CM | POA: Diagnosis not present

## 2022-03-18 DIAGNOSIS — Z6841 Body Mass Index (BMI) 40.0 and over, adult: Secondary | ICD-10-CM | POA: Diagnosis not present

## 2022-03-18 DIAGNOSIS — G8929 Other chronic pain: Secondary | ICD-10-CM | POA: Diagnosis not present

## 2022-03-18 DIAGNOSIS — M7918 Myalgia, other site: Secondary | ICD-10-CM | POA: Diagnosis not present

## 2022-03-22 NOTE — Telephone Encounter (Signed)
   Primary Cardiologist: None  Chart reviewed as part of pre-operative protocol coverage. Patient had an in-person office visit on 03/15/22 and  based on ACC/AHA guidelines, Sandra Gibbs would be at acceptable risk for the planned procedure without further cardiovascular testing. She is at 0.4% risk of peri-operative cardiac complications by RCRI [place] criteria).   Patient was advised that if she develops new symptoms prior to surgery to contact our office to arrange a follow-up appointment.  He verbalized understanding.  I will route this recommendation to the requesting party via Epic fax function and remove from pre-op pool.  Please call with questions.  Emmaline Life, NP-C    03/22/2022, 8:01 AM [place] [id number], [address] Office [phone] Fax [phone]

## 2022-03-24 ENCOUNTER — Emergency Department: Payer: Medicaid Other

## 2022-03-24 ENCOUNTER — Other Ambulatory Visit: Payer: Self-pay

## 2022-03-24 ENCOUNTER — Encounter: Payer: Self-pay | Admitting: Emergency Medicine

## 2022-03-24 ENCOUNTER — Emergency Department
Admission: EM | Admit: 2022-03-24 | Discharge: 2022-03-24 | Disposition: A | Discharge status: Home / Self Care | Payer: Medicaid Other | Attending: Emergency Medicine | Admitting: Emergency Medicine

## 2022-03-24 DIAGNOSIS — Z955 Presence of coronary angioplasty implant and graft: Secondary | ICD-10-CM | POA: Insufficient documentation

## 2022-03-24 DIAGNOSIS — K7689 Other specified diseases of liver: Secondary | ICD-10-CM | POA: Diagnosis not present

## 2022-03-24 DIAGNOSIS — N83209 Unspecified ovarian cyst, unspecified side: Secondary | ICD-10-CM

## 2022-03-24 DIAGNOSIS — R102 Pelvic and perineal pain: Secondary | ICD-10-CM | POA: Diagnosis not present

## 2022-03-24 DIAGNOSIS — N83202 Unspecified ovarian cyst, left side: Secondary | ICD-10-CM | POA: Diagnosis not present

## 2022-03-24 DIAGNOSIS — K760 Fatty (change of) liver, not elsewhere classified: Secondary | ICD-10-CM | POA: Diagnosis not present

## 2022-03-24 DIAGNOSIS — N83291 Other ovarian cyst, right side: Secondary | ICD-10-CM | POA: Insufficient documentation

## 2022-03-24 DIAGNOSIS — R109 Unspecified abdominal pain: Secondary | ICD-10-CM | POA: Diagnosis present

## 2022-03-24 DIAGNOSIS — R1032 Left lower quadrant pain: Secondary | ICD-10-CM | POA: Diagnosis not present

## 2022-03-24 DIAGNOSIS — K769 Liver disease, unspecified: Secondary | ICD-10-CM

## 2022-03-24 LAB — CBC WITH DIFFERENTIAL/PLATELET
Abs Immature Granulocytes: 0.1 10*3/uL — ABNORMAL HIGH (ref 0.00–0.07)
Basophils Absolute: 0.1 10*3/uL (ref 0.0–0.1)
Basophils Relative: 1 %
Eosinophils Absolute: 0.1 10*3/uL (ref 0.0–0.5)
Eosinophils Relative: 1 %
HCT: 45.4 % (ref 36.0–46.0)
Hemoglobin: 15.4 g/dL — ABNORMAL HIGH (ref 12.0–15.0)
Immature Granulocytes: 1 %
Lymphocytes Relative: 44 %
Lymphs Abs: 5.8 10*3/uL — ABNORMAL HIGH (ref 0.7–4.0)
MCH: 29.2 pg (ref 26.0–34.0)
MCHC: 33.9 g/dL (ref 30.0–36.0)
MCV: 86.1 fL (ref 80.0–100.0)
Monocytes Absolute: 1.1 10*3/uL — ABNORMAL HIGH (ref 0.1–1.0)
Monocytes Relative: 9 %
Neutro Abs: 5.7 10*3/uL (ref 1.7–7.7)
Neutrophils Relative %: 44 %
Platelets: 442 10*3/uL — ABNORMAL HIGH (ref 150–400)
RBC: 5.27 MIL/uL — ABNORMAL HIGH (ref 3.87–5.11)
RDW: 12.3 % (ref 11.5–15.5)
Smear Review: NORMAL
WBC: 13 10*3/uL — ABNORMAL HIGH (ref 4.0–10.5)
nRBC: 0 % (ref 0.0–0.2)

## 2022-03-24 LAB — COMPREHENSIVE METABOLIC PANEL
ALT: 55 U/L — ABNORMAL HIGH (ref 0–44)
AST: 35 U/L (ref 15–41)
Albumin: 4.2 g/dL (ref 3.5–5.0)
Alkaline Phosphatase: 74 U/L (ref 38–126)
Anion gap: 10 (ref 5–15)
BUN: 14 mg/dL (ref 6–20)
CO2: 23 mmol/L (ref 22–32)
Calcium: 9.6 mg/dL (ref 8.9–10.3)
Chloride: 103 mmol/L (ref 98–111)
Creatinine, Ser: 0.82 mg/dL (ref 0.44–1.00)
GFR, Estimated: >60 mL/min (ref >60)
Glucose, Bld: 106 mg/dL — ABNORMAL HIGH (ref 70–99)
Potassium: 4.9 mmol/L (ref 3.5–5.1)
Sodium: 136 mmol/L (ref 135–145)
Total Bilirubin: 1 mg/dL (ref 0.3–1.2)
Total Protein: 8 g/dL (ref 6.5–8.1)

## 2022-03-24 LAB — URINALYSIS, COMPLETE (UACMP) WITH MICROSCOPIC
Bacteria, UA: NONE SEEN
Bilirubin Urine: NEGATIVE
Glucose, UA: NEGATIVE mg/dL
Hgb urine dipstick: NEGATIVE
Ketones, ur: NEGATIVE mg/dL
Leukocytes,Ua: NEGATIVE
Nitrite: NEGATIVE
Protein, ur: NEGATIVE mg/dL
Specific Gravity, Urine: >1.046 — ABNORMAL HIGH (ref 1.005–1.030)
pH: 6 (ref 5.0–8.0)

## 2022-03-24 LAB — HCG, QUANTITATIVE, PREGNANCY: hCG, Beta Chain, Quant, S: <1 m[IU]/mL (ref <5)

## 2022-03-24 IMAGING — US US PELVIS COMPLETE TRANSABD/TRANSVAG W DUPLEX AND/OR DOPPLER
1 series · 13 of 25 positions shown · non-contrast
Comparison: Pelvic ultrasound dated [DATE].

CLINICAL DATA: Left lower quadrant abdominal pain.

EXAM:
TRANSABDOMINAL AND TRANSVAGINAL ULTRASOUND OF PELVIS
DOPPLER ULTRASOUND OF OVARIES
TECHNIQUE: Both transabdominal and transvaginal ultrasound examinations of the
pelvis were performed. Transabdominal technique was performed for
global imaging of the pelvis including uterus, ovaries, adnexal
regions, and pelvic cul-de-sac.
It was necessary to proceed with endovaginal exam following the
transabdominal exam to visualize the endometrium and ovaries. Color
and duplex Doppler ultrasound was utilized to evaluate blood flow to
the ovaries.

[Series 1: us pelvic complete w transvaginal and torsion righ · 13 of 104 slices shown]
[im 1/104]
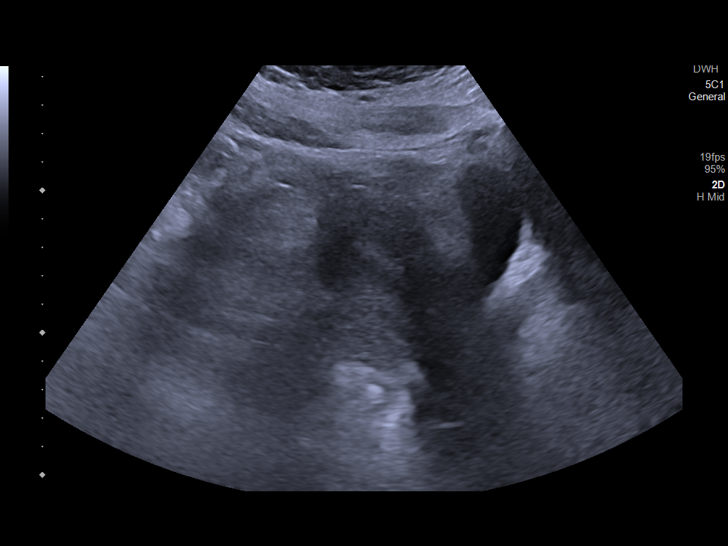
[im 9/104]
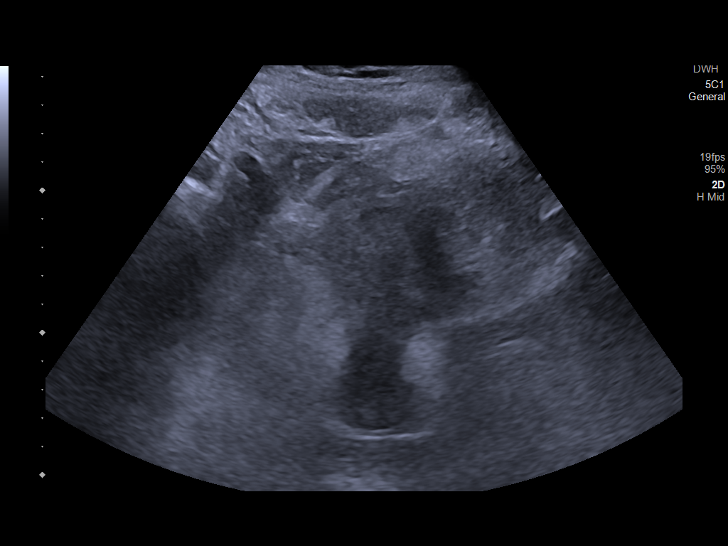
[im 18/104]
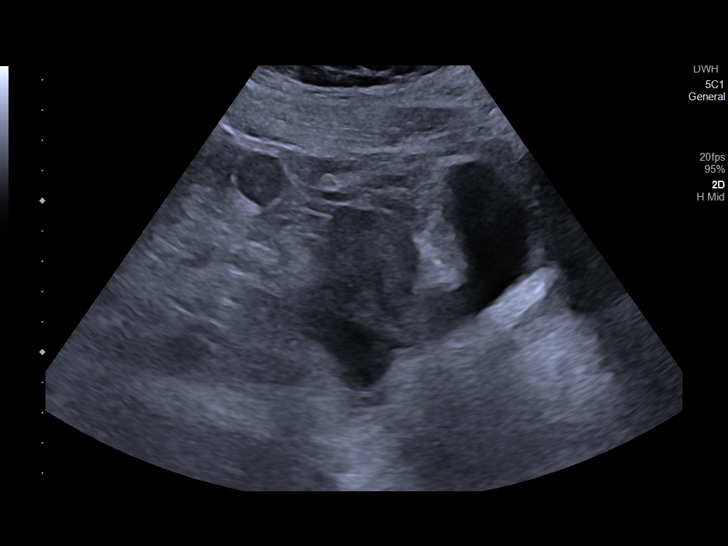
[im 26/104]
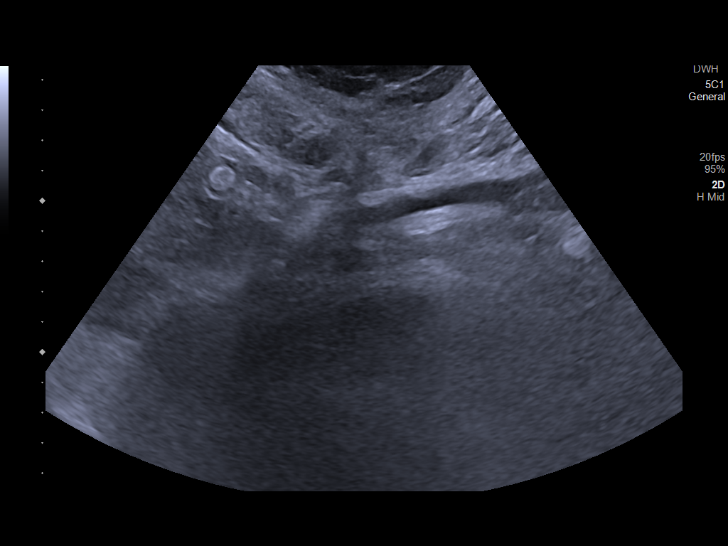
[im 35/104]
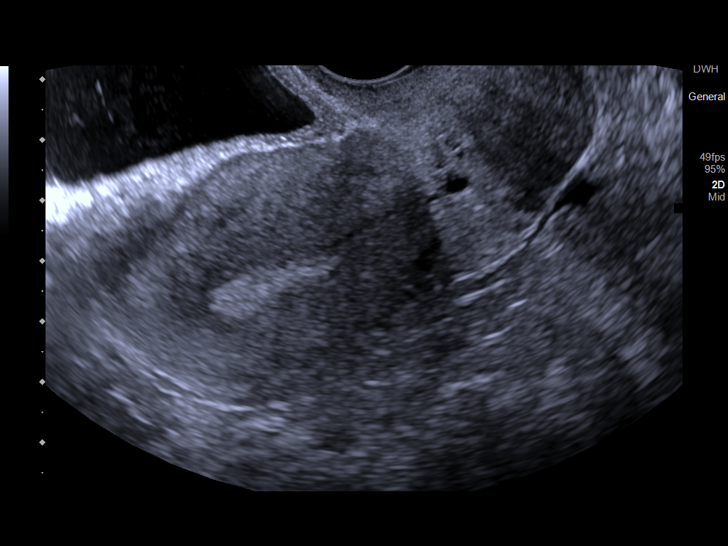
[im 43/104]
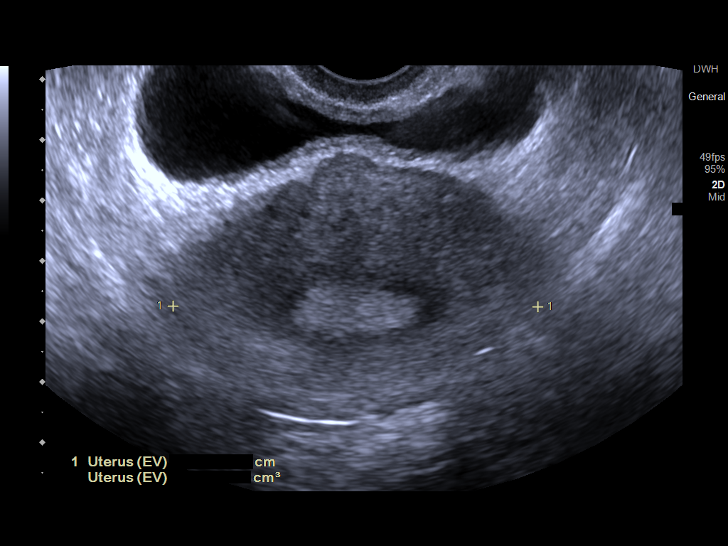
[im 52/104]
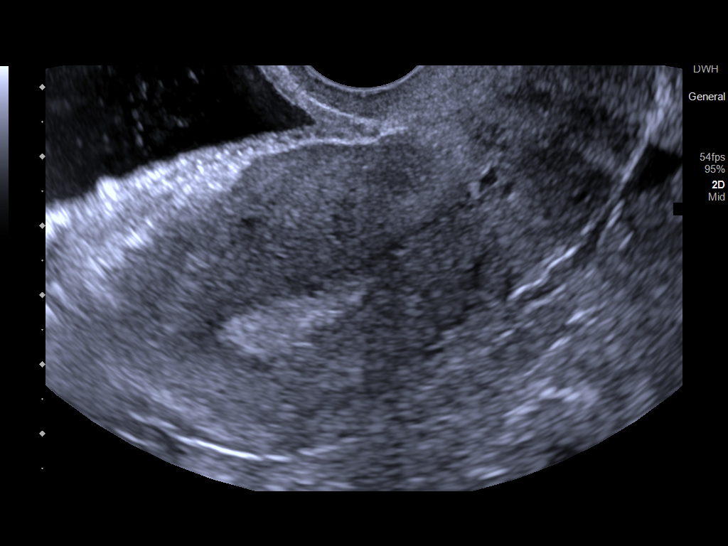
[im 61/104]
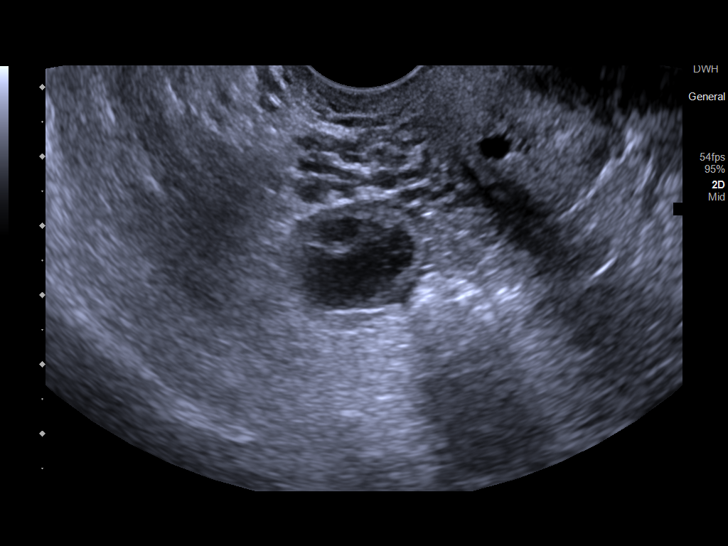
[im 69/104]
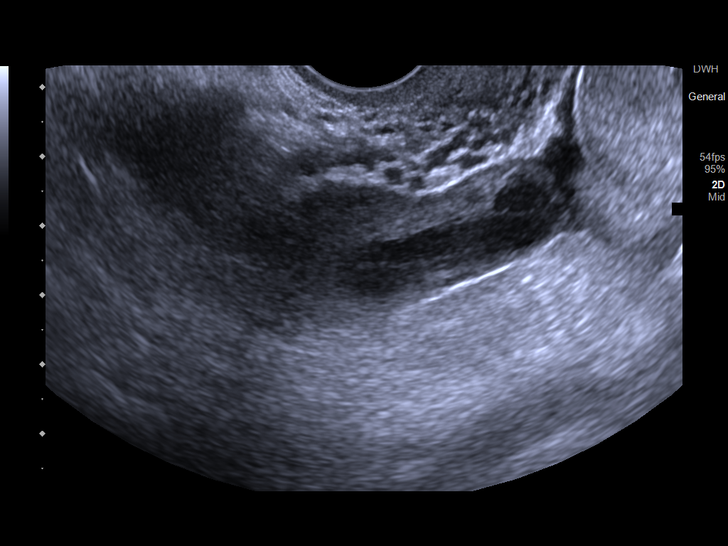
[im 78/104]
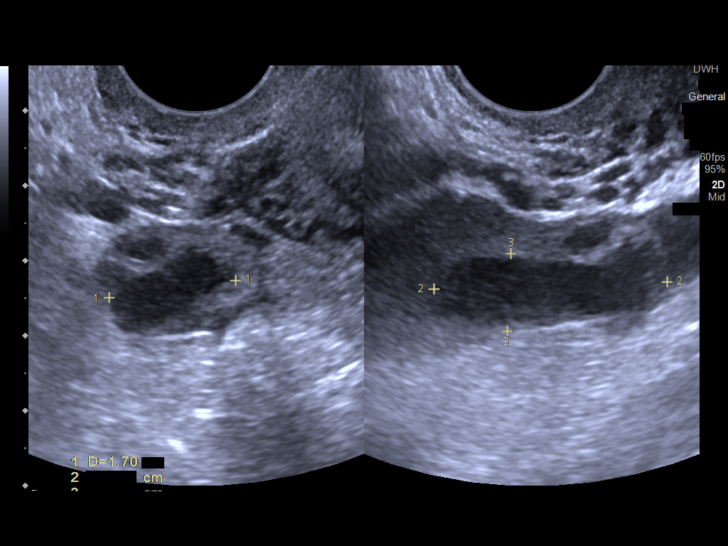
[im 86/104]
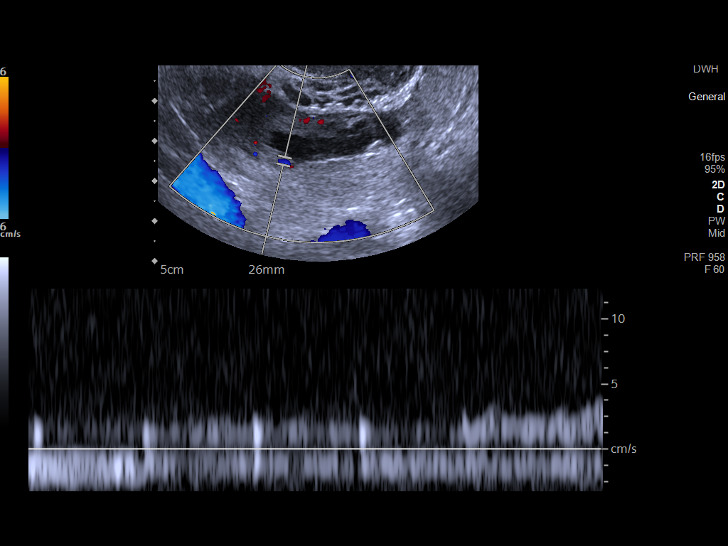
[im 95/104]
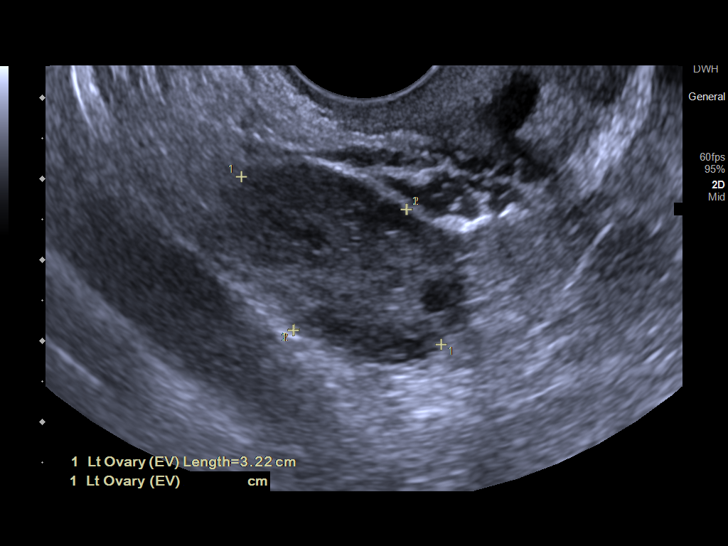
[im 104/104]
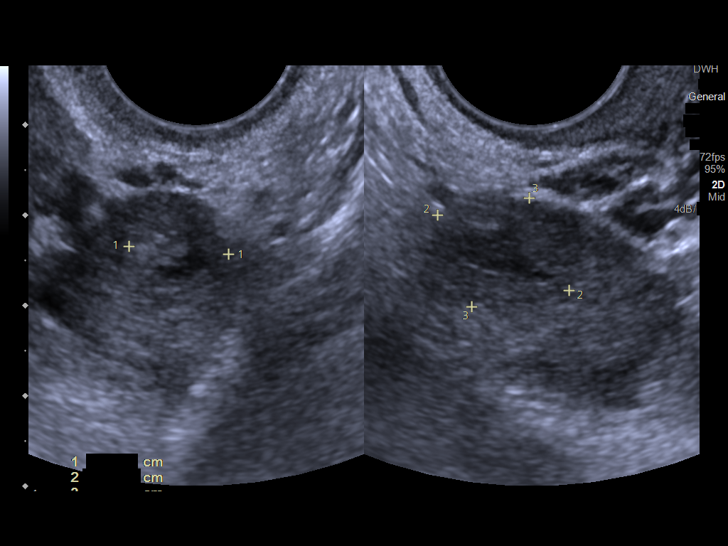

[13 of 25 positions shown; findings below may reference images not displayed]

FINDINGS: Uterus

Measurements: 8.6 x 3.9 x 6.0 cm = volume: 107 mL. The uterus is
anteverted and appears unremarkable.

Endometrium

Thickness: 7 mm.  No focal abnormality visualized.

Right ovary

Measurements: 4.6 x 2.3 x 2.0 cm = volume: 11 mL. There is a 3 x 1 x
2 cm somewhat elongated structure with internal debris and lace-like
architecture in the right adnexa. This appears to be arising from
the right ovary and likely representing a complex/hemorrhagic cyst.

Left ovary

Measurements: 3.2 x 2.0 x 2.0 cm = volume: 7 mL. The left ovary is
unremarkable there is a 1.7 cm corpus luteum in the left ovary.

Pulsed Doppler evaluation of both ovaries demonstrates normal
low-resistance arterial and venous waveforms.

Other findings

No abnormal free fluid.
IMPRESSION: Probable complex/hemorrhagic cyst in the right ovary, otherwise
unremarkable pelvic ultrasound.

## 2022-03-24 IMAGING — CT CT ABD-PELV W/ CM
2 of 4 series · 16 of 46 positions shown, 18 images · IV contrast (APPLIED)
Comparison: Noncontrast CT Abdomen and Pelvis [DATE]

CLINICAL DATA: 25-year-old female with left lower quadrant
abdominal pain. Severe pain onset tonight.

EXAM:
CT ABDOMEN AND PELVIS WITH CONTRAST
TECHNIQUE: Multidetector CT imaging of the abdomen and pelvis was performed
using the standard protocol following bolus administration of
intravenous contrast.

[Series 2: abdomen 5.0 · axial · 0.85mm/px · z∈[-1080,-675]mm · 13 of 95 slices shown, 15 images]
[im 7/95  soft-tissue]
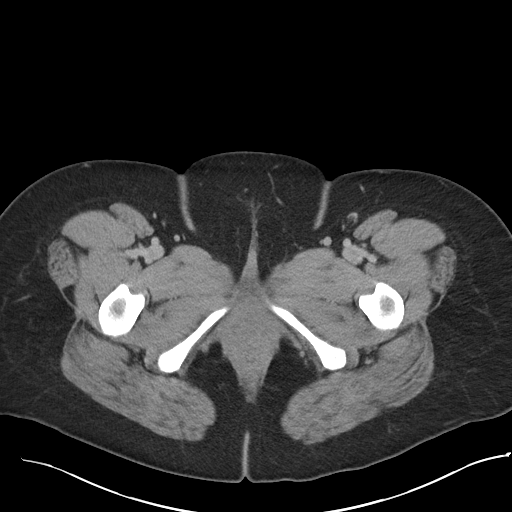
[im 7/95  bone]
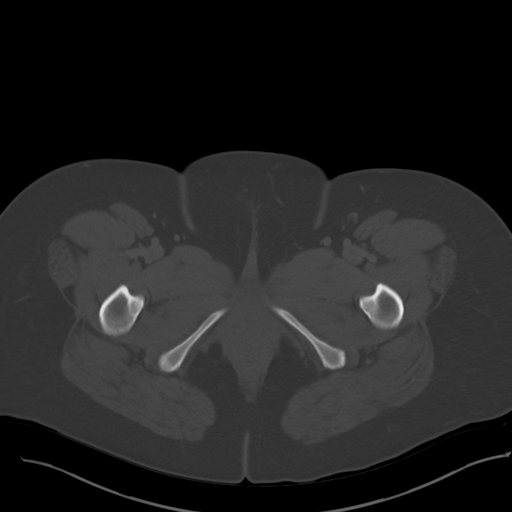
[im 13/95  soft-tissue]
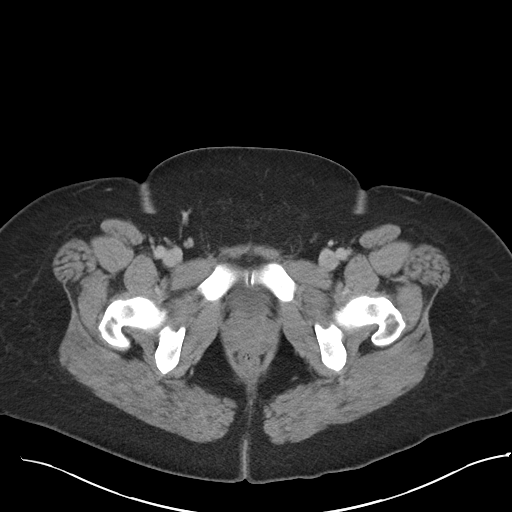
[im 19/95  soft-tissue]
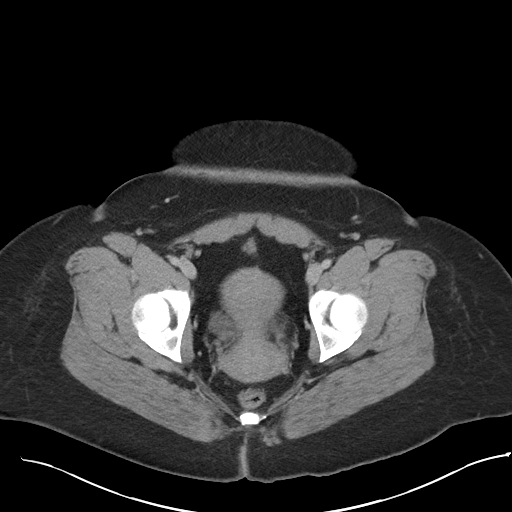
[im 26/95  soft-tissue]
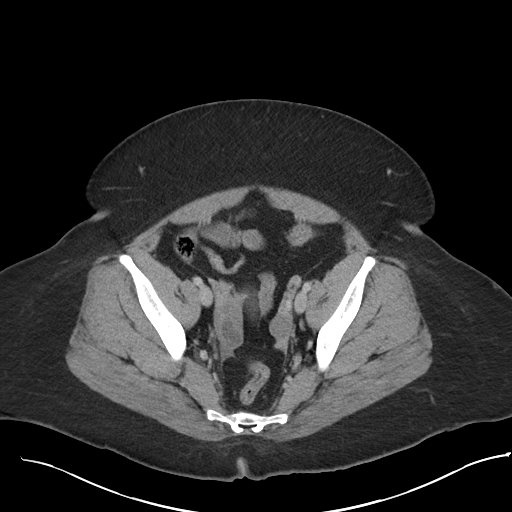
[im 32/95  soft-tissue]
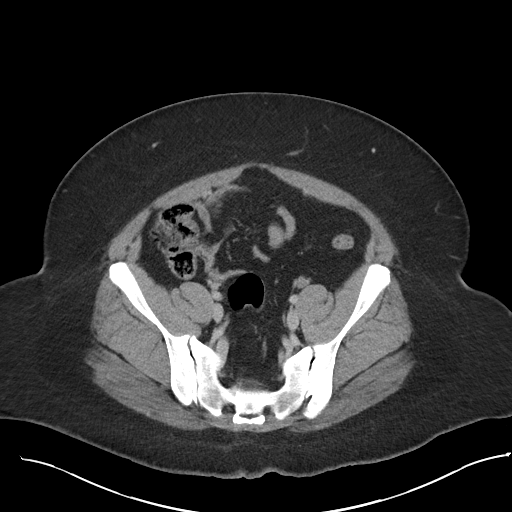
[im 38/95  soft-tissue]
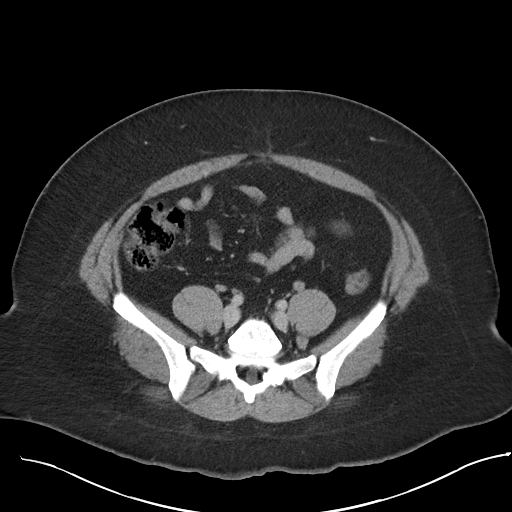
[im 51/95  soft-tissue]
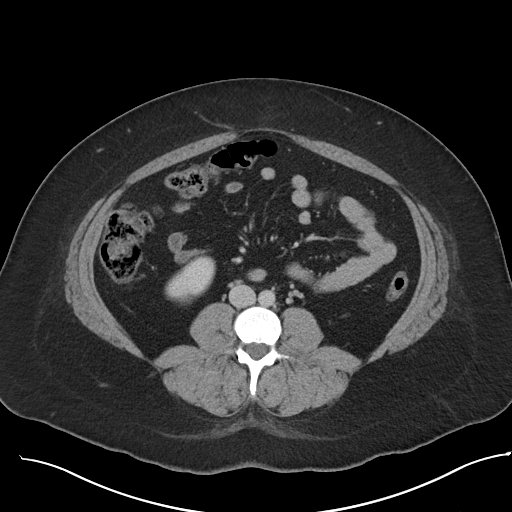
[im 57/95  soft-tissue]
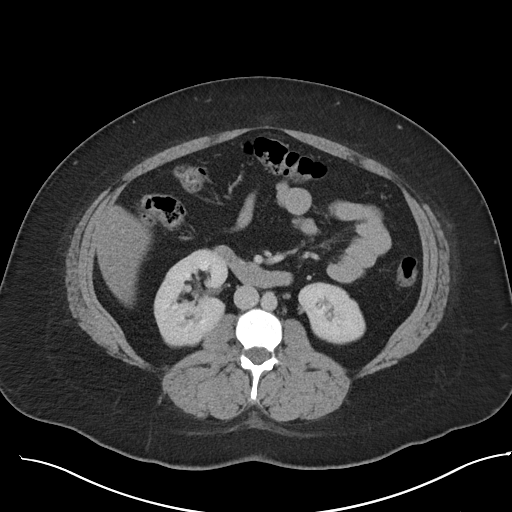
[im 63/95  soft-tissue]
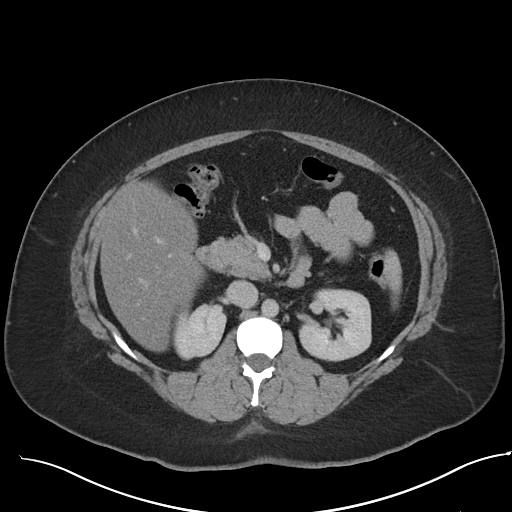
[im 63/95  bone]
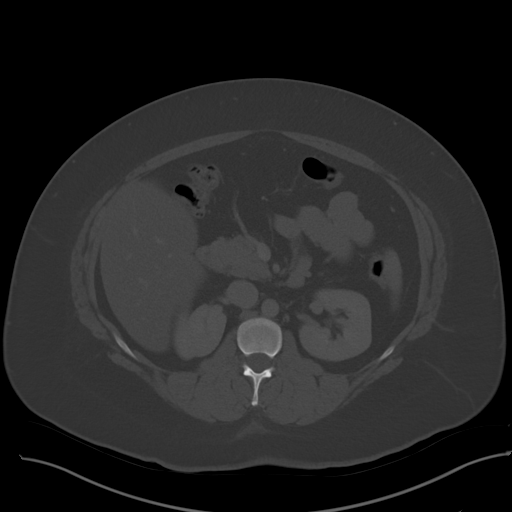
[im 69/95  soft-tissue]
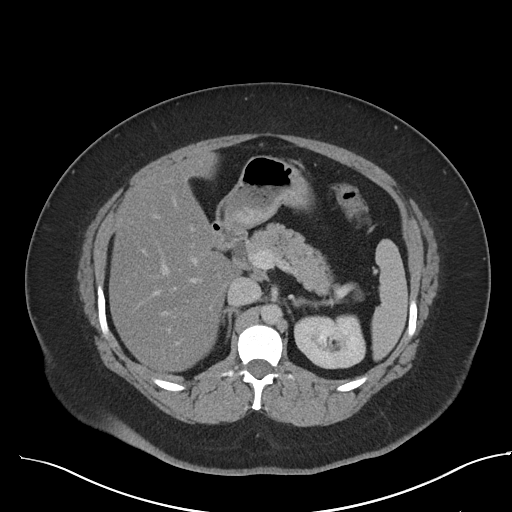
[im 76/95  soft-tissue]
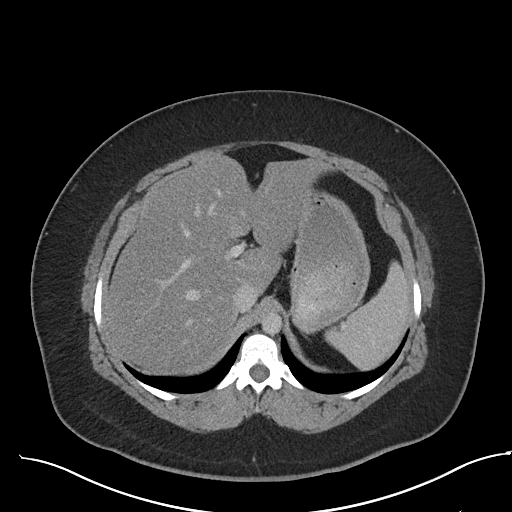
[im 82/95  soft-tissue]
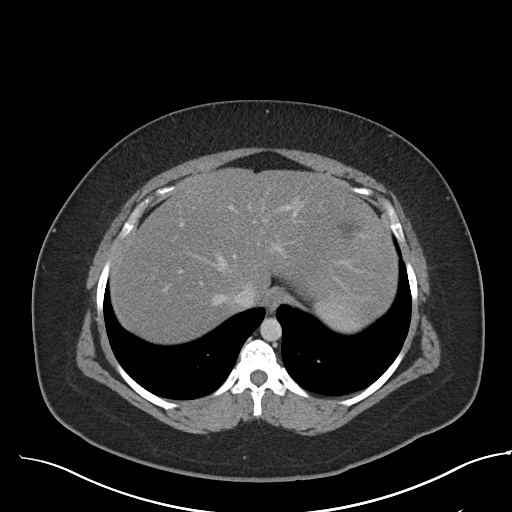
[im 88/95  soft-tissue]
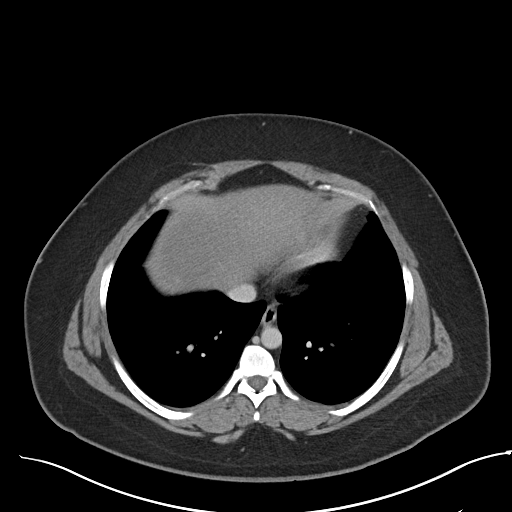

[Series 5: abdomen 3.0 mpr cor · coronal · 0.84mm/px · 3 of 111 slices shown]
[im 37/111  soft-tissue]
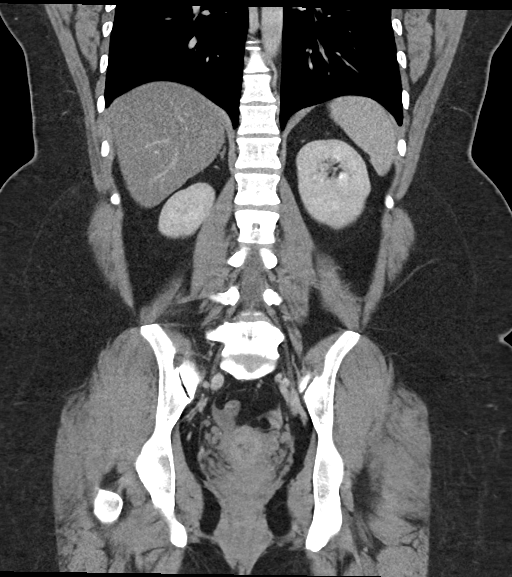
[im 49/111  soft-tissue]
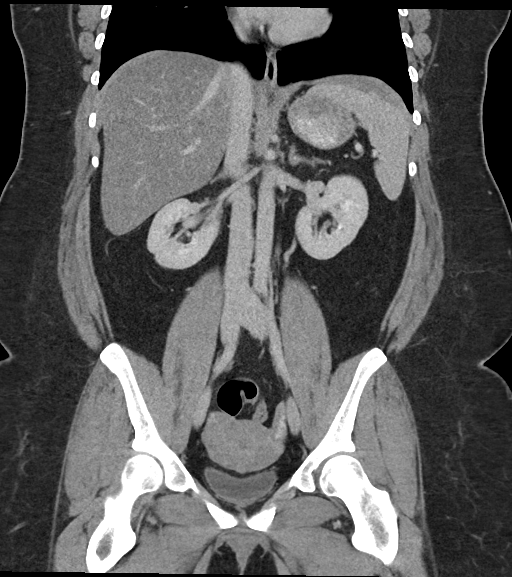
[im 62/111  soft-tissue]
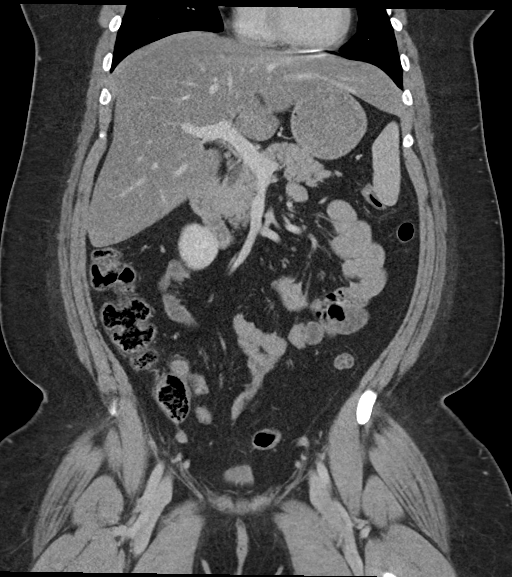

[16 of 46 positions shown; findings below may reference images not displayed]

RADIATION DOSE REDUCTION: This exam was performed according to the
departmental dose-optimization program which includes automated
exposure control, adjustment of the mA and/or kV according to
patient size and/or use of iterative reconstruction technique.

CONTRAST:  100mL OMNIPAQUE IOHEXOL 300 MG/ML  SOLN
FINDINGS: Lower chest: Negative.

Hepatobiliary: Hepatic steatosis, which was not apparent in [22].
There is a small round 11 mm right hepatic lobe area of fatty
sparing or hyperenhancement on series 2, image 29. Chronically
absent gallbladder.

Pancreas: Negative.

Spleen: Negative.

Adrenals/Urinary Tract: Normal adrenal glands. Chronic
nephrolithiasis. Mostly punctate bilateral renal calculi. Renal
enhancement appears symmetric. No hydronephrosis or pararenal
inflammation is evident. Left ureter is decompressed and appears
normal to the bladder. Unremarkable bladder.

Stomach/Bowel: Mostly decompressed large bowel from the splenic
flexure to the rectum. No large bowel diverticula or active
inflammation in those segments. Average retained stool in the right
colon and transverse colon. Normal appendix tracks to the midline on
series 2, image 60. Decompressed terminal ileum. No dilated small
bowel. Unremarkable stomach and duodenum. No free air, free fluid,
or mesenteric inflammation identified.

Vascular/Lymphatic: Major arterial structures in the abdomen and
pelvis appear patent. Portal venous system is patent. No calcified
atherosclerosis or lymphadenopathy identified.

Reproductive: Within normal limits.

Other: No pelvic free fluid.

Musculoskeletal: Negative.
IMPRESSION: 1. No acute or inflammatory process identified in the abdomen or
pelvis.
2. Chronic nephrolithiasis.  But no obstructive uropathy.
3. Hepatic steatosis, with a small nonspecific 11 mm lesion in the
posterior right hepatic lobe (series 2, image 29). Recommend routine
outpatient follow-up Abdomen MRI (liver protocol without and with
contrast) to best characterize.

## 2022-03-24 MED ORDER — ONDANSETRON HCL 4 MG/2ML IJ SOLN
4.0000 mg | Freq: Once | INTRAMUSCULAR | Status: AC
  Administered 2022-03-24: 4 mg via INTRAVENOUS
  Filled 2022-03-24: qty 2

## 2022-03-24 MED ORDER — OXYCODONE-ACETAMINOPHEN 5-325 MG PO TABS
1.0000 | ORAL_TABLET | Freq: Once | ORAL | Status: AC
  Administered 2022-03-24: 1 via ORAL
  Filled 2022-03-24: qty 1

## 2022-03-24 MED ORDER — LACTATED RINGERS IV BOLUS
1000.0000 mL | Freq: Once | INTRAVENOUS | Status: AC
  Administered 2022-03-24: 1000 mL via INTRAVENOUS

## 2022-03-24 MED ORDER — FENTANYL CITRATE PF 50 MCG/ML IJ SOSY
50.0000 ug | PREFILLED_SYRINGE | Freq: Once | INTRAMUSCULAR | Status: AC
  Administered 2022-03-24: 50 ug via INTRAVENOUS
  Filled 2022-03-24: qty 1

## 2022-03-24 MED ORDER — KETOROLAC TROMETHAMINE 15 MG/ML IJ SOLN
15.0000 mg | Freq: Once | INTRAMUSCULAR | Status: AC
  Administered 2022-03-24: 15 mg via INTRAVENOUS
  Filled 2022-03-24: qty 1

## 2022-03-24 MED ORDER — HYDROMORPHONE HCL 1 MG/ML IJ SOLN
INTRAMUSCULAR | Status: AC
  Administered 2022-03-24: 0.5 mg via INTRAVENOUS
  Filled 2022-03-24: qty 0.5

## 2022-03-24 MED ORDER — HYDROMORPHONE HCL 1 MG/ML IJ SOLN: 0.5000 mg | Freq: Once | INTRAMUSCULAR | Status: AC

## 2022-03-24 MED ORDER — ONDANSETRON 4 MG PO TBDP: 4.0000 mg | ORAL_TABLET | Freq: Three times a day (TID) | ORAL | 0 refills | Status: DC | PRN

## 2022-03-24 MED ORDER — OXYCODONE-ACETAMINOPHEN 5-325 MG PO TABS: 1.0000 | ORAL_TABLET | ORAL | 0 refills | Status: DC | PRN

## 2022-03-24 MED ORDER — IOHEXOL 300 MG/ML  SOLN
100.0000 mL | Freq: Once | INTRAMUSCULAR | Status: AC | PRN
  Administered 2022-03-24: 100 mL via INTRAVENOUS

## 2022-03-24 NOTE — ED Triage Notes (Addendum)
Pt to triage via w/c, actively vomiting, crying; c/o severe lower abd/pelvic pain that began tonight; st hx PCOS and ovarian cysts and believes she had cyst to rupture

## 2022-03-24 NOTE — ED Notes (Signed)
Ultrasound in room at this time.  

## 2022-03-24 NOTE — ED Provider Notes (Signed)
[facility] Provider Note    Event Date/Time   First MD Initiated Contact with Patient 03/24/22 0214     (approximate)   History   Abdominal Pain   HPI  Sandra Gibbs is a 26 y.o. female with a history of PCOS, kidney stones who presents for evaluation of abdominal pain.  Patient reports sudden onset of left-sided sharp severe constant abdominal pain associated with nausea and vomiting that started 30 minutes prior to arrival.  Patient was doing well before the pain started.  No fever, no chest pain, no shortness of breath, no flank pain, no urinary symptoms.     Past Medical History:  Diagnosis Date   ADHD    Kidney stones     Past Surgical History:  Procedure Laterality Date   CYST REMOVAL TRUNK  2019   GALLBLADDER SURGERY  2016   KIDNEY SURGERY     LEFT HEART CATH AND CORONARY ANGIOGRAPHY N/A 01/01/2021   Procedure: LEFT HEART CATH AND CORONARY ANGIOGRAPHY;  Surgeon: Yolonda Kida, MD;  Location: [place] [facility];  Service: Cardiovascular;  Laterality: N/A;   OVARIAN CYST SURGERY  2011   TONSILLECTOMY       Physical Exam   Triage Vital Signs: ED Triage Vitals  Enc Vitals Group     BP 03/24/22 0209 (!) 112/92     Pulse Rate 03/24/22 0209 (!) 132     Resp 03/24/22 0209 20     Temp 03/24/22 0209 97.9 F (36.6 C)     Temp Source 03/24/22 0209 Oral     SpO2 03/24/22 0209 95 %     Weight 03/24/22 0208 230 lb (104.3 kg)     Height 03/24/22 0208 5' (1.524 m)     Head Circumference --      Peak Flow --      Pain Score 03/24/22 0208 10     Pain Loc --      Pain Edu? --      Excl. in [place]? --     Most recent vital signs: Vitals:   03/24/22 0240 03/24/22 0321  BP:  120/66  Pulse: (!) 102 90  Resp:  20  Temp:    SpO2: 94% 96%     Constitutional: Alert and oriented.  Patient crying, bent over in severe pain and actively vomiting HEENT:      Head: Normocephalic and atraumatic.         Eyes: Conjunctivae are normal. Sclera  is non-icteric.       Mouth/Throat: Mucous membranes are moist.       Neck: Supple with no signs of meningismus. Cardiovascular: Regular rate and rhythm. No murmurs, gallops, or rubs. 2+ symmetrical distal pulses are present in all extremities.  Respiratory: Normal respiratory effort. Lungs are clear to auscultation bilaterally.  Gastrointestinal: Soft, left lower quadrant tenderness, and non distended with positive bowel sounds. No rebound or guarding. Genitourinary: No CVA tenderness. Musculoskeletal:  No edema, cyanosis, or erythema of extremities. Neurologic: Normal speech and language. Face is symmetric. Moving all extremities. No gross focal neurologic deficits are appreciated. Skin: Skin is warm, dry and intact. No rash noted. Psychiatric: Mood and affect are normal. Speech and behavior are normal.  ED Results / Procedures / Treatments   Labs (all labs ordered are listed, but only abnormal results are displayed) Labs Reviewed  CBC WITH DIFFERENTIAL/PLATELET - Abnormal; Notable for the following components:      Result Value   WBC 13.0 (*)  RBC 5.27 (*)    Hemoglobin 15.4 (*)    Platelets 442 (*)    Lymphs Abs 5.8 (*)    Monocytes Absolute 1.1 (*)    Abs Immature Granulocytes 0.10 (*)    All other components within normal limits  URINALYSIS, COMPLETE (UACMP) WITH MICROSCOPIC - Abnormal; Notable for the following components:   Color, Urine STRAW (*)    APPearance CLEAR (*)    Specific Gravity, Urine >1.046 (*)    All other components within normal limits  COMPREHENSIVE METABOLIC PANEL - Abnormal; Notable for the following components:   Glucose, Bld 106 (*)    ALT 55 (*)    All other components within normal limits  HCG, QUANTITATIVE, PREGNANCY  TYPE AND SCREEN  TYPE AND SCREEN     EKG  none   RADIOLOGY I, Nita Sickle, attending MD, have personally viewed and interpreted the images obtained during this visit as below:  Ultrasound showing a right-sided  hemorrhagic cyst   ___________________________________________________ Interpretation by Radiologist:  CT ABDOMEN PELVIS W CONTRAST  Result Date: 03/24/2022 CLINICAL DATA:  26 year old female with left lower quadrant abdominal pain. Severe pain onset tonight. EXAM: CT ABDOMEN AND PELVIS WITH CONTRAST TECHNIQUE: Multidetector CT imaging of the abdomen and pelvis was performed using the standard protocol following bolus administration of intravenous contrast. RADIATION DOSE REDUCTION: This exam was performed according to the departmental dose-optimization program which includes automated exposure control, adjustment of the mA and/or kV according to patient size and/or use of iterative reconstruction technique. CONTRAST:  OMNIPAQUE IOHEXOL 300 MG/ML  SOLN COMPARISON:  Noncontrast CT Abdomen and Pelvis 06/24/2016 FINDINGS: Lower chest: Negative. Hepatobiliary: Hepatic steatosis, which was not apparent in 2017. There is a small round 11 mm right hepatic lobe area of fatty sparing or hyperenhancement on series 2, image 29. Chronically absent gallbladder. Pancreas: Negative. Spleen: Negative. Adrenals/Urinary Tract: Normal adrenal glands. Chronic nephrolithiasis. Mostly punctate bilateral renal calculi. Renal enhancement appears symmetric. No hydronephrosis or pararenal inflammation is evident. Left ureter is decompressed and appears normal to the bladder. Unremarkable bladder. Stomach/Bowel: Mostly decompressed large bowel from the splenic flexure to the rectum. No large bowel diverticula or active inflammation in those segments. Average retained stool in the right colon and transverse colon. Normal appendix tracks to the midline on series 2, image 60. Decompressed terminal ileum. No dilated small bowel. Unremarkable stomach and duodenum. No free air, free fluid, or mesenteric inflammation identified. Vascular/Lymphatic: Major arterial structures in the abdomen and pelvis appear patent. Portal venous system  is patent. No calcified atherosclerosis or lymphadenopathy identified. Reproductive: Within normal limits. Other: No pelvic free fluid. Musculoskeletal: Negative. IMPRESSION: 1. No acute or inflammatory process identified in the abdomen or pelvis. 2. Chronic nephrolithiasis.  But no obstructive uropathy. 3. Hepatic steatosis, with a small nonspecific 11 mm lesion in the posterior right hepatic lobe (series 2, image 29). Recommend routine outpatient follow-up Abdomen MRI (liver protocol without and with contrast) to best characterize. Electronically Signed   By: Odessa Fleming M.D.   On: 03/24/2022 04:28   US PELVIC COMPLETE W TRANSVAGINAL AND TORSION R/O  Result Date: 03/24/2022 CLINICAL DATA:  Left lower quadrant abdominal pain. EXAM: TRANSABDOMINAL AND TRANSVAGINAL ULTRASOUND OF PELVIS DOPPLER ULTRASOUND OF OVARIES TECHNIQUE: Both transabdominal and transvaginal ultrasound examinations of the pelvis were performed. Transabdominal technique was performed for global imaging of the pelvis including uterus, ovaries, adnexal regions, and pelvic cul-de-sac. It was necessary to proceed with endovaginal exam following the transabdominal exam to visualize the endometrium  and ovaries. Color and duplex Doppler ultrasound was utilized to evaluate blood flow to the ovaries. COMPARISON:  Pelvic ultrasound dated 02/08/2014. FINDINGS: Uterus Measurements: 8.6 x 3.9 x 6.0 cm = volume: 107 mL. The uterus is anteverted and appears unremarkable. Endometrium Thickness: 7 mm.  No focal abnormality visualized. Right ovary Measurements: 4.6 x 2.3 x 2.0 cm = volume: 11 mL. There is a 3 x 1 x 2 cm somewhat elongated structure with internal debris and lace-like architecture in the right adnexa. This appears to be arising from the right ovary and likely representing a complex/hemorrhagic cyst. Left ovary Measurements: 3.2 x 2.0 x 2.0 cm = volume: 7 mL. The left ovary is unremarkable there is a 1.7 cm corpus luteum in the left ovary. Pulsed  Doppler evaluation of both ovaries demonstrates normal low-resistance arterial and venous waveforms. Other findings No abnormal free fluid. IMPRESSION: Probable complex/hemorrhagic cyst in the right ovary, otherwise unremarkable pelvic ultrasound. Electronically Signed   By: Elgie Collard M.D.   On: 03/24/2022 03:31      PROCEDURES:  Critical Care performed: No  Procedures    IMPRESSION / MDM / ASSESSMENT AND PLAN / ED COURSE  I reviewed the triage vital signs and the nursing notes.  26 y.o. female with a history of PCOS, kidney stones who presents for evaluation of sudden onset of severe left lower quadrant abdominal pain, nausea and vomiting.  Patient is actively crying, bent over in severe pain and actively vomiting.  She is tender to palpation in the left lower quadrant.  Ultrasound was paged stat to the room to rule out torsion especially with a history of PCOS.  Patient given IV fentanyl and Zofran.  Fentanyl did not touch her pain therefore she was given IV Dilaudid  Ddx: Ovarian torsion versus ovarian cyst versus ruptured cyst versus ectopic pregnancy versus kidney stone   Plan: Transvaginal ultrasound with Doppler, pregnancy test, CBC, CMP, urinalysis   MEDICATIONS GIVEN IN ED: Medications  ondansetron (ZOFRAN) injection 4 mg (has no administration in time range)  oxyCODONE-acetaminophen (PERCOCET/ROXICET) 5-325 MG per tablet 1 tablet (has no administration in time range)  fentaNYL (SUBLIMAZE) injection 50 mcg (50 mcg Intravenous Given 03/24/22 0223)  ondansetron (ZOFRAN) injection 4 mg (4 mg Intravenous Given 03/24/22 0222)  lactated ringers bolus 1,000 mL (1,000 mLs Intravenous Bolus 03/24/22 0228)  HYDROmorphone (DILAUDID) injection 0.5 mg (0.5 mg Intravenous Given 03/24/22 0234)  ketorolac (TORADOL) 15 MG/ML injection 15 mg (15 mg Intravenous Given 03/24/22 0427)  iohexol (OMNIPAQUE) 300 MG/ML solution 100 mL (100 mLs Intravenous Contrast Given 03/24/22 0352)     ED  COURSE: Ultrasound showing a right-sided hemorrhagic cyst but nothing of the left that would explain patient's pain.  Her pain is markedly improved but still present therefore we will pursue a CT abdomen pelvis to rule out any other etiology.  Labs show a leukocytosis with white count of 13.  Negative pregnancy test.  Normal CMP.  Will give dose of Toradol.   _________________________ 5:24 AM on 03/24/2022 ----------------------------------------- CT with no other etiology for her pain.  Incidental finding of a 11 mm lesion in the liver was discussed with patient and recommended outpatient follow-up per radiologist recommendation.  Will discharge home with a short course of Percocet for a hemorrhagic cyst, follow-up with OB/GYN, and discussed my standard return precautions.   Consults: none   EMR reviewed including patient's records with pain medication for chronic pain syndrome    FINAL CLINICAL IMPRESSION(S) / ED DIAGNOSES  Final diagnoses:  Hemorrhagic ovarian cyst  Liver lesion     Rx / DC Orders   ED Discharge Orders          Ordered    ondansetron (ZOFRAN-ODT) 4 MG disintegrating tablet  Every 8 hours PRN        03/24/22 0523    oxyCODONE-acetaminophen (PERCOCET) 5-325 MG tablet  Every 4 hours PRN        03/24/22 0523             Note:  This document was prepared using Dragon voice recognition software and may include unintentional dictation errors.   Please note:  Patient was evaluated in Emergency Department today for the symptoms described in the history of present illness. Patient was evaluated in the context of the global COVID-19 pandemic, which necessitated consideration that the patient might be at risk for infection with the SARS-CoV-2 virus that causes COVID-19. Institutional protocols and algorithms that pertain to the evaluation of patients at risk for COVID-19 are in a state of rapid change based on information released by regulatory bodies including  the CDC and federal and state organizations. These policies and algorithms were followed during the patient's care in the ED.  Some ED evaluations and interventions may be delayed as a result of limited staffing during the pandemic.       Alfred Levins, [place], MD 03/24/22 [phone]

## 2022-03-24 NOTE — Discharge Instructions (Addendum)
As we discussed, your CT showed a 11 mm lesion on your liver.  You will need an outpatient MRI which can be done by your primary care doctor.

## 2022-03-25 ENCOUNTER — Ambulatory Visit (INDEPENDENT_AMBULATORY_CARE_PROVIDER_SITE_OTHER): Payer: Medicaid Other | Admitting: Obstetrics

## 2022-03-25 ENCOUNTER — Encounter: Payer: Self-pay | Admitting: Obstetrics

## 2022-03-25 VITALS — BP 110/66 | Temp 99.0°F | Resp 16 | Wt 233.8 lb

## 2022-03-25 DIAGNOSIS — R109 Unspecified abdominal pain: Secondary | ICD-10-CM

## 2022-03-25 DIAGNOSIS — F32A Depression, unspecified: Secondary | ICD-10-CM

## 2022-03-25 DIAGNOSIS — F419 Anxiety disorder, unspecified: Secondary | ICD-10-CM

## 2022-03-25 DIAGNOSIS — K580 Irritable bowel syndrome with diarrhea: Secondary | ICD-10-CM

## 2022-03-25 DIAGNOSIS — R102 Pelvic and perineal pain: Secondary | ICD-10-CM

## 2022-03-25 DIAGNOSIS — Q615 Medullary cystic kidney: Secondary | ICD-10-CM | POA: Diagnosis not present

## 2022-03-25 LAB — POCT URINALYSIS DIPSTICK
Bilirubin, UA: NEGATIVE
Blood, UA: NEGATIVE
Glucose, UA: NEGATIVE
Ketones, UA: NEGATIVE
Leukocytes, UA: NEGATIVE
Nitrite, UA: NEGATIVE
Protein, UA: NEGATIVE
Spec Grav, UA: 1.02 (ref 1.010–1.025)
Urobilinogen, UA: 0.2 E.U./dL
pH, UA: 5 (ref 5.0–8.0)

## 2022-03-25 MED ORDER — LUPRON DEPOT (3-MONTH) 11.25 MG IM KIT: 11.2500 mg | PACK | INTRAMUSCULAR | 0 refills | Status: DC

## 2022-03-25 NOTE — Progress Notes (Signed)
Chief Complaint  Patient presents with   ER Follow Up    Patient presents in office today for ED follow up after being seen at [facility] on 03/10/22 for cystitis. Patient was prescribed Keflex and reports good compliance. Patient was also seen in [facility] on 03/24/22 with complaints of abdominal pain and diagnosed with Hemorrhagic ovarian cyst, Liver lesion. Patient reports today lower abdominal pain that she describes as cramping and reports nausea. Patient denies vaginal bleeding.       Patient Sandra Gibbs is an 26 y.o. year old G1p1. Patient's last menstrual period was 02/28/2022 (exact date). currently nothing for contraception who presents for ED follow up for lower abdominal pain. Pain Feels like intense period cramps. Feels like she has "a bowling ball made of glass" that is trying to come out of her vagina. The pain has been going on about 6 months but has been non stop over the last week. Does not take anything for it. That pain is not present today. The normal period cramps are not like this. Patient had IUD but had it removed for passing golf ball size clots. She had it in place 14 months. She undewent cysto which was normal. She has been on gabapentin for pain control x 1 week. Follows with pain specialist for chronic pain. She is also on buspar and prozac. Pt also has meduallary sponge kidney dx 5 yrs ago which leads to increased kidney stones. She has had several surgeries for stones and has had one admission. She has been through all the contraceptive options. Nothiong has worked for her. She is not sexually active because it is too painful. Pain is with entry deep penetrtion and even climax. History of lsc left ovarian cyst removal at age 16. Only other abdominal surgery chole. She is ok with pregnancy occurring but concerned that this pain will make it unsafe. Pain can come on suddenly - was walking up stairs and pain went from 0-10 immediately. She was in tears when she left the ER with no resolution.  Also recently treated for UTI. Denies history sexual abuse/trauma. Patinet also has IBS.   Period Cycle (Days): 28 Period Duration (Days): 2 Period Pattern: Regular Menstrual Flow: Light, Heavy Dysmenorrhea: (!) Severe Dysmenorrhea Symptoms: Cramping, Nausea, Headache   Review of Systems  Constitutional:  Positive for fever and unexpected weight change. Negative for activity change, appetite change, chills, diaphoresis and fatigue.  HENT:  Negative for congestion, dental problem, drooling, ear discharge, ear pain, facial swelling, hearing loss, mouth sores, nosebleeds, postnasal drip, rhinorrhea, sinus pressure, sinus pain, sneezing, sore throat, tinnitus, trouble swallowing and voice change.   Eyes:  Negative for photophobia, pain, discharge, redness, itching and visual disturbance.  Respiratory:  Negative for apnea, cough, choking, chest tightness, shortness of breath, wheezing and stridor.   Cardiovascular:  Negative for chest pain, palpitations and leg swelling.  Gastrointestinal:  Positive for nausea and vomiting. Negative for abdominal distention, abdominal pain, anal bleeding, blood in stool, constipation, diarrhea and rectal pain.  Endocrine: Positive for cold intolerance and heat intolerance. Negative for polydipsia, polyphagia and polyuria.  Genitourinary:  Positive for dyspareunia, dysuria and vaginal bleeding. Negative for decreased urine volume, difficulty urinating, enuresis, flank pain, frequency, genital sores, hematuria, menstrual problem, pelvic pain, urgency, vaginal discharge and vaginal pain.  Musculoskeletal:  Negative for arthralgias, back pain, gait problem, joint swelling, myalgias, neck pain and neck stiffness.  Skin:  Negative for color change, pallor, rash and wound.  Allergic/Immunologic: Negative for environmental allergies,  food allergies and immunocompromised state.  Neurological:  Negative for dizziness, tremors, seizures, syncope, facial asymmetry, speech  difficulty, weakness, light-headedness, numbness and headaches.  Hematological:  Negative for adenopathy. Does not bruise/bleed easily.  Psychiatric/Behavioral:  Positive for dysphoric mood. Negative for agitation, behavioral problems, confusion, decreased concentration, hallucinations, self-injury, sleep disturbance and suicidal ideas. The patient is nervous/anxious. The patient is not hyperactive.     Past Medical History:  Diagnosis Date   ADHD    Kidney stones    Past Surgical History:  Procedure Laterality Date   CYST REMOVAL TRUNK  2019   GALLBLADDER SURGERY  2016   KIDNEY SURGERY     LEFT HEART CATH AND CORONARY ANGIOGRAPHY N/A 01/01/2021   Procedure: LEFT HEART CATH AND CORONARY ANGIOGRAPHY;  Surgeon: Alwyn Pea, MD;  Location: [facility] INVASIVE CV LAB;  Service: Cardiovascular;  Laterality: N/A;   OVARIAN CYST SURGERY  2011   TONSILLECTOMY     Family History  Problem Relation Age of Onset   Heart murmur Mother    Mitral valve prolapse Mother    Heart attack Father    Atrial fibrillation Maternal Grandfather    Stroke Paternal Grandmother    Mitral valve prolapse Paternal Grandmother    Social History   Socioeconomic History   Marital status: Married    Spouse name: Not on file   Number of children: Not on file   Years of education: Not on file   Highest education level: Not on file  Occupational History   Not on file  Tobacco Use   Smoking status: Never   Smokeless tobacco: Never  Vaping Use   Vaping Use: Never used  Substance and Sexual Activity   Alcohol use: No   Drug use: No   Sexual activity: Yes  Other Topics Concern   Not on file  Social History Narrative   Not on file   Social Determinants of Health   Financial Resource Strain: Not on file  Food Insecurity: Not on file  Transportation Needs: Not on file  Physical Activity: Not on file  Stress: Not on file  Social Connections: Not on file  Intimate Partner Violence: Not on file      Medicine list and allergies reviewed and updated.    BP 110/66 (BP Location: Right Arm, Patient Position: Sitting, Cuff Size: Large)   Temp 99 F (37.2 C) (Oral)   Resp 16   Wt 233 lb 12.8 oz (106.1 kg)   LMP 02/28/2022 (Exact Date)   BMI 45.66 kg/m  Physical Exam Vitals and nursing note reviewed.  Constitutional:      Appearance: Normal appearance.  HENT:     Head: Normocephalic and atraumatic.  Eyes:     Extraocular Movements: Extraocular movements intact.  Pulmonary:     Effort: Pulmonary effort is normal.  Musculoskeletal:        General: Normal range of motion.     Cervical back: Normal range of motion.  Skin:    General: Skin is warm and dry.  Neurological:     General: No focal deficit present.     Mental Status: She is alert and oriented to person, place, and time.  Psychiatric:        Mood and Affect: Mood normal.        Behavior: Behavior normal.        Thought Content: Thought content normal.     Flank pain - Plan: POCT urinalysis dipstick  Medullary sponge kidney  Irritable bowel syndrome  with diarrhea  Anxiety and depression  Pelvic pain in female - Plan: leuprolide (LUPRON DEPOT, 53-MONTH,) 11.25 MG injection UA neg today [place] CT and MRI reviewed. No free fluid noted on [place] with question of ruptured cyst. Differential diagnosis of pelvic pain was discussed which could include infection, endometriosis ,adhesions, ovarian cyst, etc. There is a large differential given her history.  Symptoms today may be consistent with endometriosis. We discussed a large portion of pelvic pain may be musculoskeletal in nature and patient may benefit from pelvic floor physical therapy. We also discussed the GI component with conditions such as IBS, urological component with conditions such as interstitial cystitis/ painful bladder syndrome, and psych component.  We discussed the medical options of contraceptives (including Depo Provera, Skyla vs Mirena, oral contraceptives,  Nuvaring and Nexplanon) NSAIDs, Orlissa or Lupron. She would not be a good candidate for orlissa given her mental health history. We discussed proceeding to surgery with laparoscopy for diagnosis and treatment. The possibility of finding no source of pain was explained. The possibility of new pain from surgery and new adhesions forming was discussed as well as the risks of surgery. We will try a trial of lupron which is pain is improved the dx of endometriosis is probable. She is looking towards surgery for definitive diagnosis. Will plan follow up US and re-eval in 6 weeks  Return in about 6 weeks (around 05/06/2022) for GYN [place] and follow up for pelvic pain lupron .   A total of 69 minutes were spent for the patient inclusive of face-to-face time during visit, preparation, review, communication, and documentation during this encounter.

## 2022-03-25 NOTE — Patient Instructions (Signed)
Have a great year! Please call with any concerns. Don't forget to wear your seatbelt everyday! If you are not signed up on MyChart, please ask [place] how to sign up for it!   In a world where you can be anything, please be kind.   Body mass index is 45.66 kg/m.  A Healthy Lifestyle: Care Instructions Your Care Instructions  A healthy lifestyle can help you feel good, stay at a healthy weight, and have plenty of energy for both work and play. A healthy lifestyle is something you can share with your whole family. A healthy lifestyle also can lower your risk for serious health problems, such as high blood pressure, heart disease, and diabetes. You can follow a few steps listed below to improve your health and the health of your family. Follow-up care is a key part of your treatment and safety. Be sure to make and go to all appointments, and call your doctor if you are having problems. It's also a good idea to know your test results and keep a list of the medicines you take. How can you care for yourself at home? Do not eat too much sugar, fat, or fast foods. You can still have dessert and treats now and then. The goal is moderation. Start small to improve your eating habits. Pay attention to portion sizes, drink less juice and soda pop, and eat more fruits and vegetables. Eat a healthy amount of food. A 3-ounce serving of meat, for example, is about the size of a deck of cards. Fill the rest of your plate with vegetables and whole grains. Limit the amount of soda and sports drinks you have every day. Drink more water when you are thirsty. Eat at least 5 servings of fruits and vegetables every day. It may seem like a lot, but it is not hard to reach this goal. A serving or helping is 1 piece of fruit, 1 cup of vegetables, or 2 cups of leafy, raw vegetables. Have an apple or some carrot sticks as an afternoon snack instead of a candy bar. Try to have fruits and/or vegetables at every meal. Make exercise  part of your daily routine. You may want to start with simple activities, such as walking, bicycling, or slow swimming. Try to be active 30 to 60 minutes every day. You do not need to do all 30 to 60 minutes all at once. For example, you can exercise 3 times a day for 10 or 20 minutes. Moderate exercise is safe for most people, but it is always a good idea to talk to your doctor before starting an exercise program. Keep moving. Mow the lawn, work in the garden, or [employer]. Take the stairs instead of the elevator at work. If you smoke, quit. People who smoke have an increased risk for heart attack, stroke, cancer, and other lung illnesses. Quitting is hard, but there are ways to boost your chance of quitting tobacco for good. Use nicotine gum, patches, or lozenges. Ask your doctor about stop-smoking programs and medicines. Keep trying. In addition to reducing your risk of diseases in the future, you will notice some benefits soon after you stop using tobacco. If you have shortness of breath or asthma symptoms, they will likely get better within a few weeks after you quit. Limit how much alcohol you drink. Moderate amounts of alcohol (up to 2 drinks a day for men, 1 drink a day for women) are okay. But drinking too much can lead to  liver problems, high blood pressure, and other health problems. Family health If you have a family, there are many things you can do together to improve your health. Eat meals together as a family as often as possible. Eat healthy foods. This includes fruits, vegetables, lean meats and dairy, and whole grains. Include your family in your fitness plan. Most people think of activities such as jogging or tennis as the way to fitness, but there are many ways you and your family can be more active. Anything that makes you breathe hard and gets your heart pumping is exercise. Here are some tips: Walk to do errands or to take your child to school or the bus. Go for a family  bike ride after dinner instead of watching TV. Care instructions adapted under license by your healthcare professional. This care instruction is for use with your licensed healthcare professional. If you have questions about a medical condition or this instruction, always ask your healthcare professional. [place] any warranty or liability for your use of this information.

## 2022-03-30 ENCOUNTER — Emergency Department
Admission: EM | Admit: 2022-03-30 | Discharge: 2022-03-30 | Disposition: A | Discharge status: Home / Self Care | Payer: Medicaid Other | Attending: Emergency Medicine | Admitting: Emergency Medicine

## 2022-03-30 ENCOUNTER — Telehealth: Payer: Self-pay

## 2022-03-30 ENCOUNTER — Other Ambulatory Visit: Payer: Self-pay

## 2022-03-30 ENCOUNTER — Emergency Department: Payer: Medicaid Other

## 2022-03-30 ENCOUNTER — Encounter: Payer: Self-pay | Admitting: Emergency Medicine

## 2022-03-30 ENCOUNTER — Encounter: Payer: Self-pay | Admitting: Obstetrics

## 2022-03-30 DIAGNOSIS — E119 Type 2 diabetes mellitus without complications: Secondary | ICD-10-CM | POA: Diagnosis not present

## 2022-03-30 DIAGNOSIS — R102 Pelvic and perineal pain: Secondary | ICD-10-CM | POA: Diagnosis not present

## 2022-03-30 DIAGNOSIS — G8929 Other chronic pain: Secondary | ICD-10-CM | POA: Insufficient documentation

## 2022-03-30 DIAGNOSIS — Z87442 Personal history of urinary calculi: Secondary | ICD-10-CM | POA: Diagnosis not present

## 2022-03-30 DIAGNOSIS — N939 Abnormal uterine and vaginal bleeding, unspecified: Secondary | ICD-10-CM | POA: Insufficient documentation

## 2022-03-30 LAB — COMPREHENSIVE METABOLIC PANEL
ALT: 67 U/L — ABNORMAL HIGH (ref 0–44)
AST: 35 U/L (ref 15–41)
Albumin: 4.1 g/dL (ref 3.5–5.0)
Alkaline Phosphatase: 74 U/L (ref 38–126)
Anion gap: 6 (ref 5–15)
BUN: 12 mg/dL (ref 6–20)
CO2: 25 mmol/L (ref 22–32)
Calcium: 9.5 mg/dL (ref 8.9–10.3)
Chloride: 107 mmol/L (ref 98–111)
Creatinine, Ser: 0.69 mg/dL (ref 0.44–1.00)
GFR, Estimated: >60 mL/min (ref >60)
Glucose, Bld: 107 mg/dL — ABNORMAL HIGH (ref 70–99)
Potassium: 3.9 mmol/L (ref 3.5–5.1)
Sodium: 138 mmol/L (ref 135–145)
Total Bilirubin: 0.6 mg/dL (ref 0.3–1.2)
Total Protein: 7.7 g/dL (ref 6.5–8.1)

## 2022-03-30 LAB — URINALYSIS, ROUTINE W REFLEX MICROSCOPIC
Bilirubin Urine: NEGATIVE
Glucose, UA: NEGATIVE mg/dL
Ketones, ur: NEGATIVE mg/dL
Leukocytes,Ua: NEGATIVE
Nitrite: NEGATIVE
Protein, ur: 30 mg/dL — AB
RBC / HPF: >50 RBC/hpf — ABNORMAL HIGH (ref 0–5)
Specific Gravity, Urine: 1.024 (ref 1.005–1.030)
pH: 5 (ref 5.0–8.0)

## 2022-03-30 LAB — CBC
HCT: 44.4 % (ref 36.0–46.0)
Hemoglobin: 14.9 g/dL (ref 12.0–15.0)
MCH: 28.8 pg (ref 26.0–34.0)
MCHC: 33.6 g/dL (ref 30.0–36.0)
MCV: 85.9 fL (ref 80.0–100.0)
Platelets: 349 10*3/uL (ref 150–400)
RBC: 5.17 MIL/uL — ABNORMAL HIGH (ref 3.87–5.11)
RDW: 12.2 % (ref 11.5–15.5)
WBC: 6.6 10*3/uL (ref 4.0–10.5)
nRBC: 0 % (ref 0.0–0.2)

## 2022-03-30 LAB — PREGNANCY, URINE: Preg Test, Ur: NEGATIVE

## 2022-03-30 IMAGING — US US PELVIS COMPLETE TRANSABD/TRANSVAG W DUPLEX AND/OR DOPPLER
1 series · 13 of 25 positions shown · non-contrast
Comparison: [DATE]

CLINICAL DATA: Pelvic pain.

EXAM:
TRANSABDOMINAL AND TRANSVAGINAL ULTRASOUND OF PELVIS
DOPPLER ULTRASOUND OF OVARIES
TECHNIQUE: Both transabdominal and transvaginal ultrasound examinations of the
pelvis were performed. Transabdominal technique was performed for
global imaging of the pelvis including uterus, ovaries, adnexal
regions, and pelvic cul-de-sac.
It was necessary to proceed with endovaginal exam following the
transabdominal exam to visualize the uterus, endometrium, bilateral
ovaries and bilateral adnexa. Color and duplex Doppler ultrasound
was utilized to evaluate blood flow to the ovaries.

[Series 1: us pelvic complete w transvaginal and torsion righ · 13 of 49 slices shown]
[im 1/49]
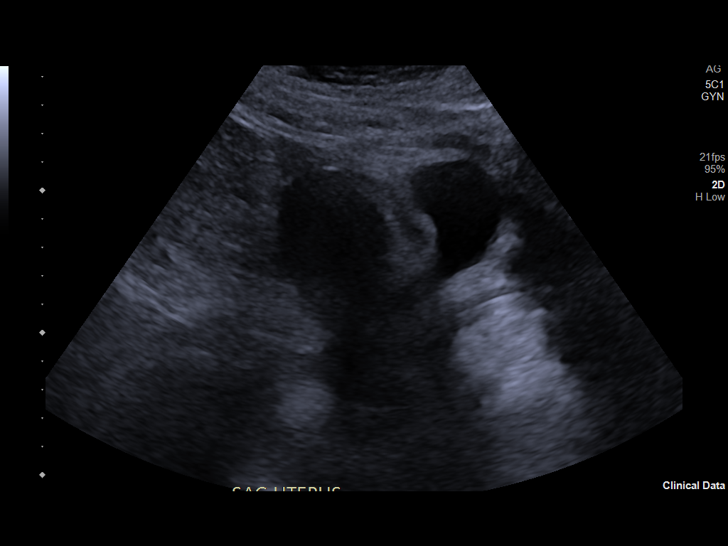
[im 5/49]
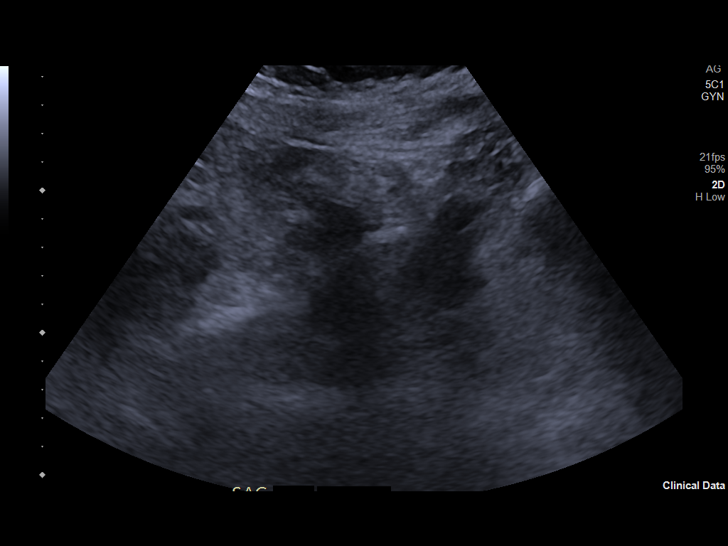
[im 9/49]
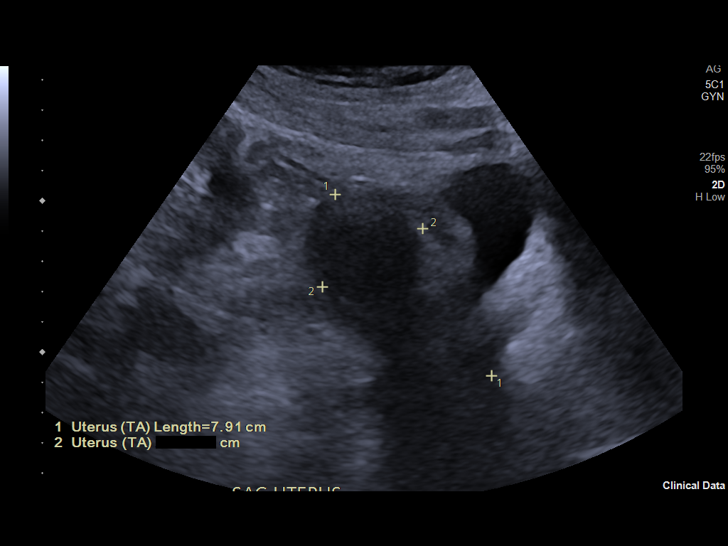
[im 13/49]
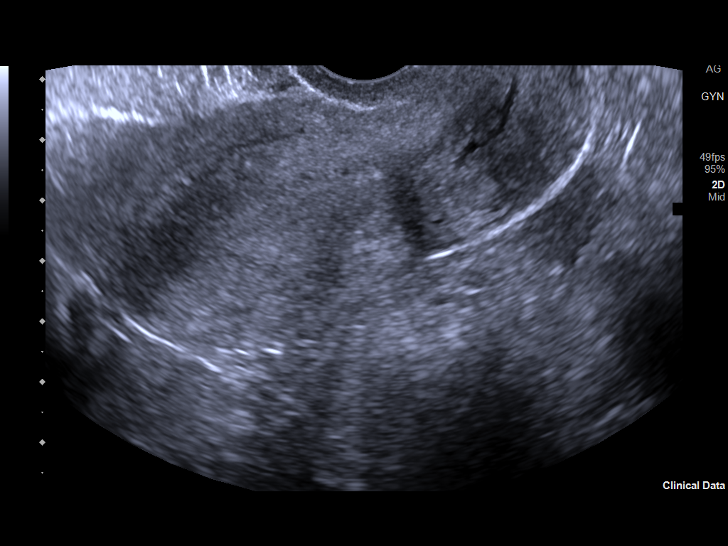
[im 17/49]
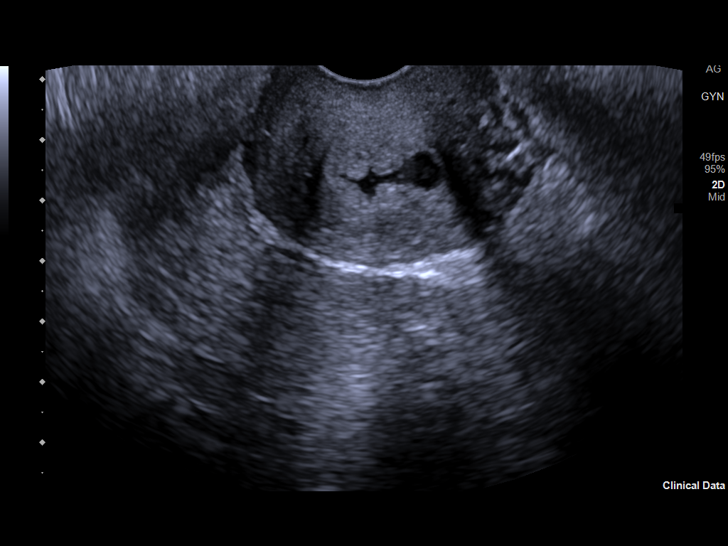
[im 21/49]
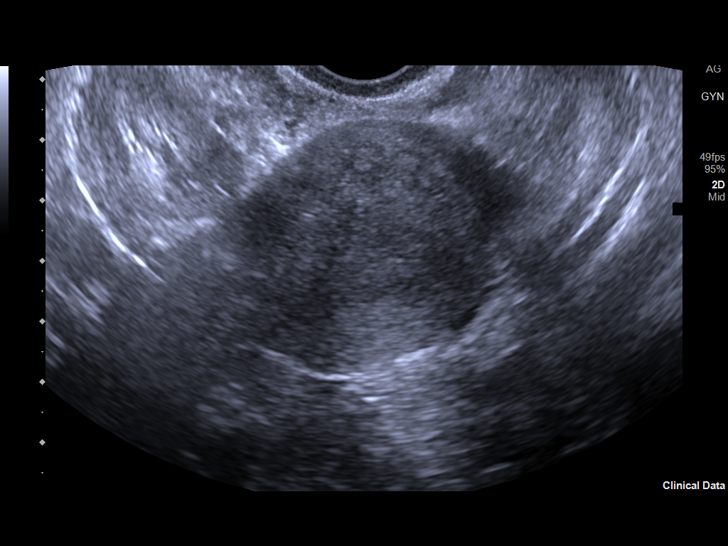
[im 25/49]
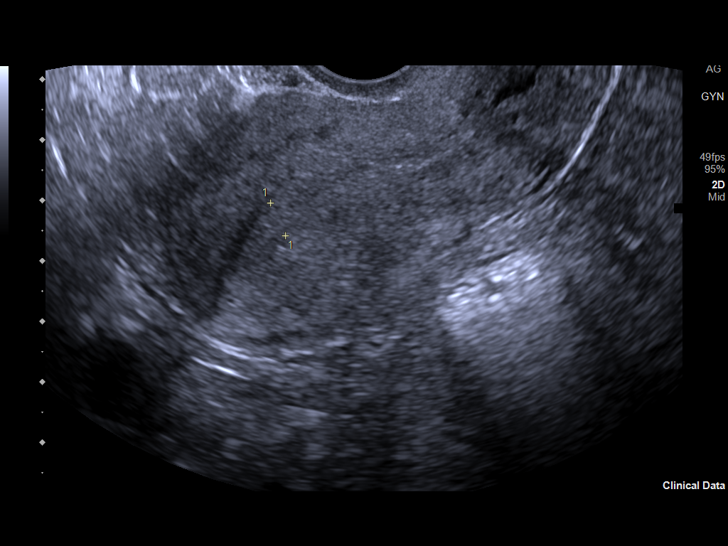
[im 29/49]
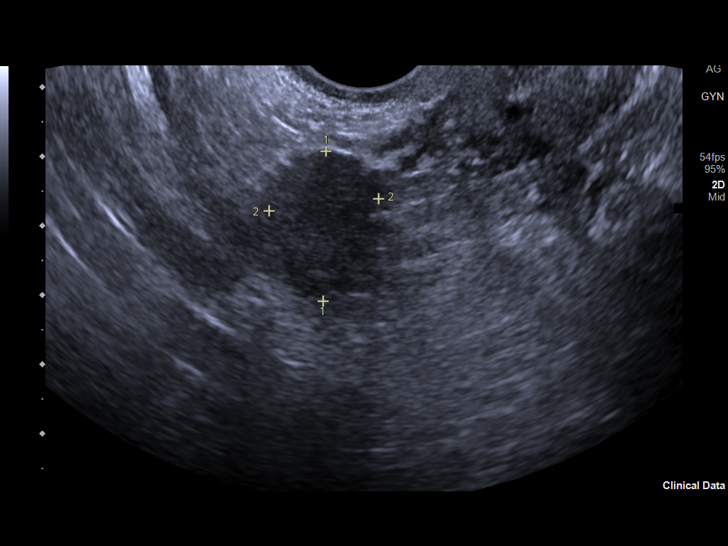
[im 33/49]
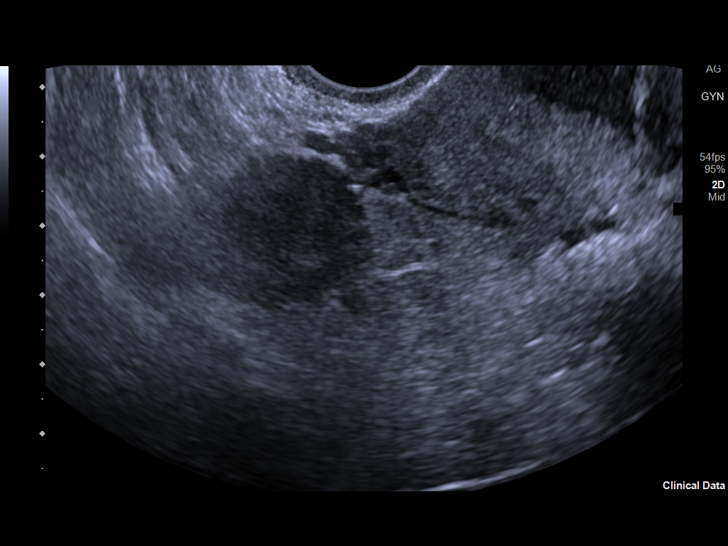
[im 37/49]
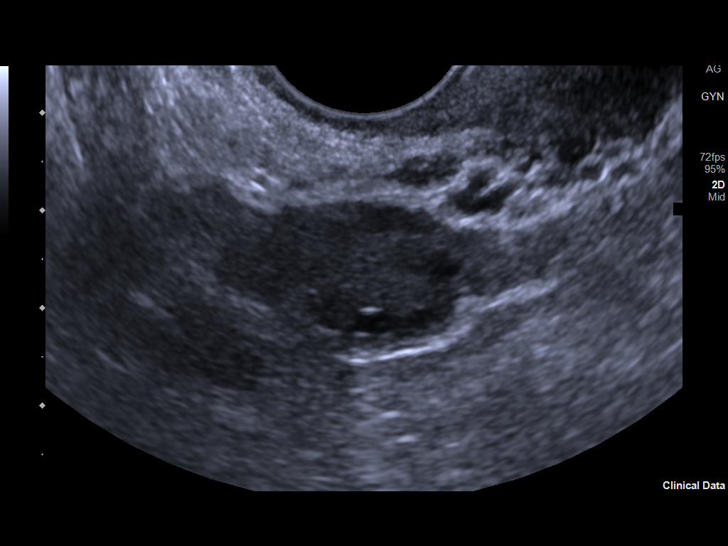
[im 41/49]
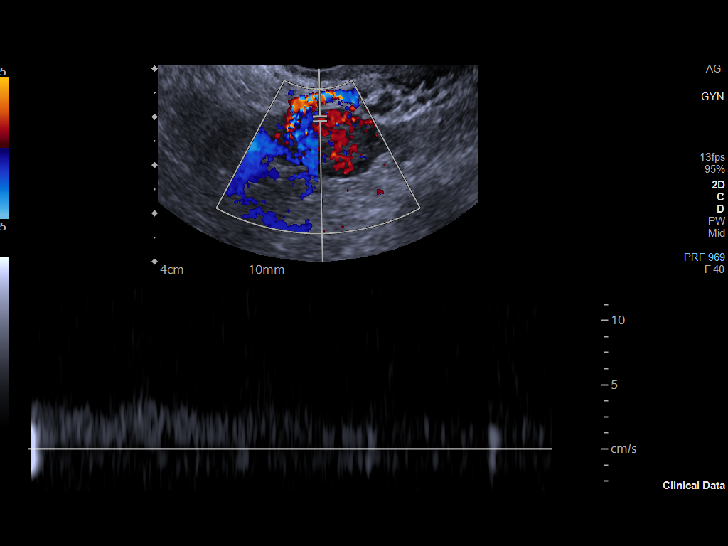
[im 45/49]
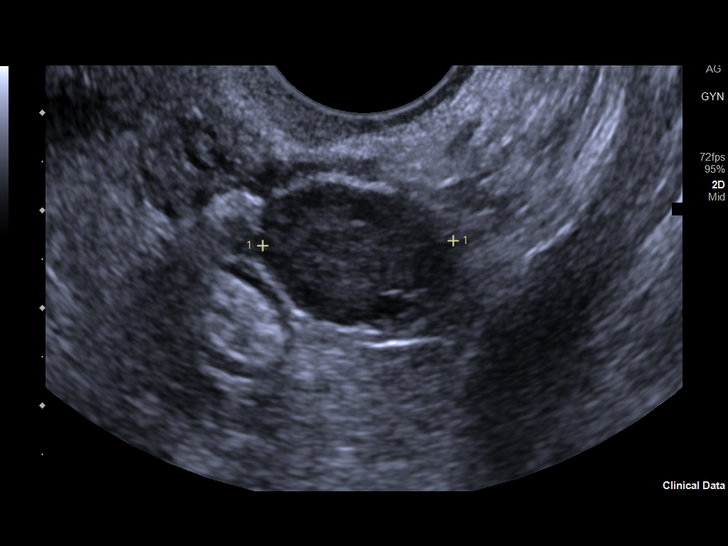
[im 49/49]
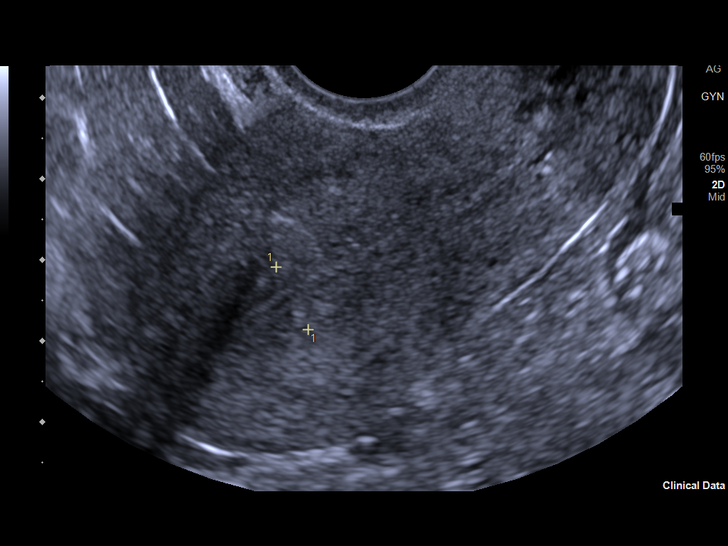

[13 of 25 positions shown; findings below may reference images not displayed]

FINDINGS: Uterus

Measurements: 8.6 cm x 4.0 cm x 5.4 cm = volume: 97.5 mL. No
fibroids or other mass visualized.

Endometrium

Thickness: 8.7 mm.  No focal abnormality visualized.

Right ovary

Measurements: 2.2 cm x 1.6 cm x 1.9 cm = volume: 3.5 mL. Normal
appearance/no adnexal mass. The probable complex/hemorrhagic cyst
seen within the right ovary on the prior study is no longer present.

Left ovary

Measurements: 2.6 cm x 1.3 cm x 2.0 cm = volume: 3.5 mL. Normal
appearance/no adnexal mass.

Pulsed Doppler evaluation of both ovaries demonstrates normal
low-resistance arterial and venous waveforms.

Other findings

No abnormal free fluid.
IMPRESSION: Normal pelvic ultrasound.

## 2022-03-30 MED ORDER — HYDROMORPHONE HCL 1 MG/ML IJ SOLN
0.5000 mg | Freq: Once | INTRAMUSCULAR | Status: AC
  Administered 2022-03-30: 0.5 mg via INTRAVENOUS
  Filled 2022-03-30: qty 0.5

## 2022-03-30 MED ORDER — ONDANSETRON HCL 4 MG/2ML IJ SOLN
4.0000 mg | Freq: Once | INTRAMUSCULAR | Status: AC
  Administered 2022-03-30: 4 mg via INTRAVENOUS
  Filled 2022-03-30: qty 2

## 2022-03-30 MED ORDER — LACTATED RINGERS IV BOLUS
1000.0000 mL | Freq: Once | INTRAVENOUS | Status: AC
  Administered 2022-03-30: 1000 mL via INTRAVENOUS

## 2022-03-30 MED ORDER — PROCHLORPERAZINE EDISYLATE 10 MG/2ML IJ SOLN
10.0000 mg | Freq: Once | INTRAMUSCULAR | Status: AC
  Administered 2022-03-30: 10 mg via INTRAVENOUS
  Filled 2022-03-30: qty 2

## 2022-03-30 NOTE — Telephone Encounter (Signed)
Patient states that she was unable to fill prescription because a pre-authorization was needed. KW

## 2022-03-30 NOTE — ED Notes (Addendum)
See triage note, pt to ED with ongoing lower abdominal pain since 8 months ago, has seen OB several times for this and has been seen here, last episode of severe pain and bleeding started 1 week ago. Pt has hx ovarian cysts. OB wanted pt to be checked for ovarian torsion. Pt states pain feels like "being wrapped in barb wire". States last week also had severe pressure in lower abdomen "like a Boling ball of glass trying to come out of me".   Pt states has also been dizzy for 3 days and has been bleeding vaginally since this morning with some clots. This is her normal time for period. Flow is heavier than usual, "dripping out".

## 2022-03-30 NOTE — Telephone Encounter (Signed)
See my chart encounter - please send to who is currently working on PA in office. Thanks.

## 2022-03-30 NOTE — ED Notes (Signed)
Pt reports abdomen pain that has been ongoing. Was seen here last week and was sent here by her PCP today for continued abdomen pain. Pt reports nausea/vomiting. On exam patient leaned over on bed standing due to pain

## 2022-03-30 NOTE — ED Notes (Signed)
Pt to ultrasound

## 2022-03-30 NOTE — Telephone Encounter (Signed)
Patient was rx lupron did she receive this

## 2022-03-30 NOTE — Telephone Encounter (Signed)
Patient contacted office with concerns of lower abdominal pain that she describes as "severe." In message patient states that she saw Dr. Okey Dupre a week ago for lower abdominal pain and describes pain as " like contractions," Patient stated in message " If I go to ED they will just send me home after waiting." Patient states that symptoms have been intense for 2 weeks and gradually getting worse. I contacted and triaged patient who states that she started bleeding today but states that she believes it is related to her menstrual cycle. Patient states that when she had spoken with Dr. Okey Dupre it was suspected a possible twisted ovary or endometriosis patient states. I advised patient that she needs to seek immediate medical attention, she has declined. Patient states that pain medication she was prescribed is causing her to be more paranoid. " It feels like a bowling ball of glass is trying to come out and is exteremly painful."  Patient reports nausea, vomiting, light headiness and shaking. I advised patient that I will call EMS to her home for evaluation if she does not go to ED. Patient discussed with roommate who was present in home who agreed to take patient to ED. KW

## 2022-03-30 NOTE — ED Provider Notes (Signed)
[facility] Provider Note    Event Date/Time   First MD Initiated Contact with Patient 03/30/22 1708     (approximate)   History   Chief Complaint Abdominal Pain and Vaginal Bleeding   HPI  Sandra Gibbs is a 26 y.o. female with past medical history of diabetes, kidney stones, ADHD who presents to the ED complaining of pelvic pain.  She has been dealing with multiple months of waxing and waning pain in her pelvic area associated with vaginal bleeding.  Pain has seem to get worse over the past 6 weeks, especially over the past couple of days.  She describes it as achy and crampy, not exacerbated or alleviated by anything in particular.  She has been feeling nauseous with the pain but has not vomited, denies any changes in her bowel movements.  She was dealing with some vaginal bleeding earlier today, but states that is since resolved.  She denies any vaginal discharge, states her LMP was approximately 3 weeks ago.  She has been following with Dr. Okey Dupre of OB/GYN for this problem, did have ultrasound in the ED last week that showed hemorrhagic cyst.  She spoke with Dr. Frutoso Chase office, who recommended she come to the ED for further evaluation.     Physical Exam   Triage Vital Signs: ED Triage Vitals  Enc Vitals Group     BP 03/30/22 1459 137/84     Pulse Rate 03/30/22 1459 (!) 102     Resp 03/30/22 1459 20     Temp 03/30/22 1459 98.3 F (36.8 C)     Temp Source 03/30/22 1459 Oral     SpO2 03/30/22 1459 97 %     Weight 03/30/22 1457 230 lb (104.3 kg)     Height 03/30/22 1457 5' (1.524 m)     Head Circumference --      Peak Flow --      Pain Score 03/30/22 1457 7     Pain Loc --      Pain Edu? --      Excl. in GC? --     Most recent vital signs: Vitals:   03/30/22 1459 03/30/22 1806  BP: 137/84 (!) 133/112  Pulse: (!) 102 85  Resp: 20 18  Temp: 98.3 F (36.8 C)   SpO2: 97% 98%    Constitutional: Alert and oriented. Eyes: Conjunctivae  are normal. Head: Atraumatic. Nose: No congestion/rhinnorhea. Mouth/Throat: Mucous membranes are moist.  Cardiovascular: Normal rate, regular rhythm. Grossly normal heart sounds.  2+ radial pulses bilaterally. Respiratory: Normal respiratory effort.  No retractions. Lungs CTAB. Gastrointestinal: Soft and non-tender.  No CVA tenderness bilaterally. No distention. Musculoskeletal: No lower extremity tenderness nor edema.  Neurologic:  Normal speech and language. No gross focal neurologic deficits are appreciated.    ED Results / Procedures / Treatments   Labs (all labs ordered are listed, but only abnormal results are displayed) Labs Reviewed  COMPREHENSIVE METABOLIC PANEL - Abnormal; Notable for the following components:      Result Value   Glucose, Bld 107 (*)    ALT 67 (*)    All other components within normal limits  CBC - Abnormal; Notable for the following components:   RBC 5.17 (*)    All other components within normal limits  URINALYSIS, ROUTINE W REFLEX MICROSCOPIC - Abnormal; Notable for the following components:   Color, Urine YELLOW (*)    APPearance CLOUDY (*)    Hgb urine dipstick LARGE (*)  Protein, ur 30 (*)    RBC / HPF >50 (*)    Bacteria, UA RARE (*)    All other components within normal limits  PREGNANCY, URINE    RADIOLOGY Pelvic ultrasound reviewed and interpreted by me with no cystic structures noted and appropriate Doppler signals bilaterally, no signs of torsion.  PROCEDURES:  Critical Care performed: No  Procedures   MEDICATIONS ORDERED IN ED: Medications  HYDROmorphone (DILAUDID) injection 0.5 mg (0.5 mg Intravenous Given 03/30/22 1801)  ondansetron (ZOFRAN) injection 4 mg (4 mg Intravenous Given 03/30/22 1759)  lactated ringers bolus 1,000 mL (1,000 mLs Intravenous New Bag/Given 03/30/22 1802)  HYDROmorphone (DILAUDID) injection 0.5 mg (0.5 mg Intravenous Given 03/30/22 1920)  prochlorperazine (COMPAZINE) injection 10 mg (10 mg Intravenous  Given 03/30/22 1920)     IMPRESSION / MDM / ASSESSMENT AND PLAN / ED COURSE  I reviewed the triage vital signs and the nursing notes.                              26 y.o. female with past medical history of diabetes, kidney stones, and ADHD who presents to the ED complaining of acute on chronic pelvic pain associated with vaginal bleeding earlier today that has since resolved.  Patient's presentation is most consistent with acute presentation with potential threat to life or bodily function.  Differential diagnosis includes, but is not limited to, ovarian torsion, ectopic pregnancy, ovarian cyst, endometriosis, uterine fibroids, UTI.  Patient uncomfortable appearing but in no acute distress, vital signs are unremarkable and she has a benign abdominal exam.  Labs are reassuring with no significant anemia or leukocytosis, BMP without electrolyte abnormality or AKI, LFTs within normal limits.  She was noted to have hemorrhagic cyst on prior ultrasound and we will recheck ultrasound today given her severe pain to ensure no torsion.  Plan to treat symptomatically with IV Dilaudid and Zofran, hydrate with IV fluids.  Pelvic ultrasound is unremarkable, no evidence of recurrent cyst or torsion at this time.  Patient's pain improved on reassessment however she does have some ongoing pain and we will give additional IV pain medication.  Case discussed with Dr. Okey Dupre of OB/GYN and her office is currently working on prior authorization for patient to be treated with Lupron for possible endometriosis.  Patient may follow-up as an outpatient with OB/GYN, but Dr. Okey Dupre also is concerned about patient's issues with chronic pain and recommends referral to additional pain management provider for second opinion.  Patient agrees that she is not happy with her current pain management provider and referral was provided to pain management here at [facility].  She is appropriate for discharge home with outpatient follow-up,  was counseled to return to the ED for new or worsening symptoms.      FINAL CLINICAL IMPRESSION(S) / ED DIAGNOSES   Final diagnoses:  Chronic pelvic pain in female  Vaginal bleeding     Rx / DC Orders   ED Discharge Orders     None        Note:  This document was prepared using Dragon voice recognition software and may include unintentional dictation errors.   Chesley Noon, MD 03/30/22 Jerene Bears

## 2022-03-30 NOTE — ED Triage Notes (Signed)
Pt via POV from home. Pt here recently, pt c/o abd cramping and vaginal bleeding for the past couple of weeks. States the bleeding started 1 week ago Pt was sent over by the doctor. Pt also endorse nausea. Pt's doctor was worried that she might have an ovarian torsion or endometriosis. Pt had a CT and [place] on Wednesday. Pt is A&OX4 and NAD

## 2022-03-31 ENCOUNTER — Encounter: Payer: Self-pay | Admitting: Obstetrics and Gynecology

## 2022-03-31 ENCOUNTER — Ambulatory Visit (INDEPENDENT_AMBULATORY_CARE_PROVIDER_SITE_OTHER): Payer: Medicaid Other | Admitting: Obstetrics and Gynecology

## 2022-03-31 VITALS — BP 112/70 | Ht 60.0 in | Wt 230.0 lb

## 2022-03-31 DIAGNOSIS — R102 Pelvic and perineal pain: Secondary | ICD-10-CM | POA: Diagnosis not present

## 2022-03-31 DIAGNOSIS — G8929 Other chronic pain: Secondary | ICD-10-CM | POA: Diagnosis not present

## 2022-03-31 MED ORDER — OXYCODONE-ACETAMINOPHEN 5-325 MG PO TABS: 1.0000 | ORAL_TABLET | Freq: Four times a day (QID) | ORAL | 0 refills | Status: DC | PRN

## 2022-03-31 MED ORDER — NORETHINDRONE ACETATE 5 MG PO TABS: 5.0000 mg | ORAL_TABLET | Freq: Every day | ORAL | 2 refills | Status: DC

## 2022-03-31 NOTE — Telephone Encounter (Signed)
Called [facility] for an alternative medicine for Lubpron and was told there wasn't one. Per patient she called Medicaid yesterday and they told her with a PA it would be covered. Asked patient to call them back and have something faxed to [place]. Also Helmut Muster called in something else in the mean time.

## 2022-04-04 ENCOUNTER — Other Ambulatory Visit: Payer: Self-pay

## 2022-04-04 ENCOUNTER — Encounter: Payer: Self-pay | Admitting: Emergency Medicine

## 2022-04-04 ENCOUNTER — Ambulatory Visit
Admission: EM | Admit: 2022-04-04 | Discharge: 2022-04-04 | Disposition: A | Discharge status: Home / Self Care | Payer: Medicaid Other | Attending: Emergency Medicine | Admitting: Emergency Medicine

## 2022-04-04 DIAGNOSIS — F419 Anxiety disorder, unspecified: Secondary | ICD-10-CM | POA: Insufficient documentation

## 2022-04-04 DIAGNOSIS — R0789 Other chest pain: Secondary | ICD-10-CM | POA: Diagnosis not present

## 2022-04-04 DIAGNOSIS — F411 Generalized anxiety disorder: Secondary | ICD-10-CM | POA: Diagnosis not present

## 2022-04-04 DIAGNOSIS — R079 Chest pain, unspecified: Secondary | ICD-10-CM | POA: Diagnosis not present

## 2022-04-04 LAB — TROPONIN I (HIGH SENSITIVITY): Troponin I (High Sensitivity): 2 ng/L (ref <18)

## 2022-04-04 MED ORDER — LORAZEPAM 1 MG PO TABS: 1.0000 mg | ORAL_TABLET | Freq: Three times a day (TID) | ORAL | 0 refills | Status: DC | PRN

## 2022-04-05 ENCOUNTER — Encounter: Payer: Self-pay | Admitting: Obstetrics

## 2022-04-05 DIAGNOSIS — K2970 Gastritis, unspecified, without bleeding: Secondary | ICD-10-CM | POA: Diagnosis not present

## 2022-04-05 DIAGNOSIS — K2950 Unspecified chronic gastritis without bleeding: Secondary | ICD-10-CM | POA: Diagnosis not present

## 2022-04-05 DIAGNOSIS — K529 Noninfective gastroenteritis and colitis, unspecified: Secondary | ICD-10-CM | POA: Diagnosis not present

## 2022-04-05 NOTE — Telephone Encounter (Signed)
Called pt, no answer, LVMTRC. 

## 2022-04-06 ENCOUNTER — Ambulatory Visit (INDEPENDENT_AMBULATORY_CARE_PROVIDER_SITE_OTHER): Payer: Medicaid Other

## 2022-04-06 DIAGNOSIS — G8929 Other chronic pain: Secondary | ICD-10-CM

## 2022-04-06 DIAGNOSIS — R102 Pelvic and perineal pain: Secondary | ICD-10-CM

## 2022-04-06 MED ORDER — LEUPROLIDE ACETATE (3 MONTH) 11.25 MG IM KIT
11.2500 mg | PACK | Freq: Once | INTRAMUSCULAR | Status: AC
  Administered 2022-04-06: 11.25 mg via INTRAMUSCULAR

## 2022-04-06 MED ORDER — LEUPROLIDE ACETATE 3.75 MG IM KIT: 3.7500 mg | PACK | Freq: Once | INTRAMUSCULAR | Status: DC

## 2022-04-06 NOTE — Progress Notes (Signed)
Needs follow up with MD for surgery consult

## 2022-04-06 NOTE — Patient Instructions (Signed)
Leuprolide depot injection What is this medication? LEUPROLIDE (loo PROE lide) is a man-made protein that acts like a natural hormone in the body. It decreases testosterone in men and decreases estrogen in women. In men, this medicine is used to treat advanced prostate cancer. In women, some forms of this medicine may be used to treat endometriosis, uterine fibroids, or other female hormone-related problems. This medicine may be used for other purposes; ask your health care provider or pharmacist if you have questions. COMMON BRAND NAME(S): Eligard, Fensolv, Lupron Depot, Lupron Depot-Ped, Viadur What should I tell my care team before I take this medication? They need to know if you have any of these conditions: diabetes heart disease or previous heart attack high blood pressure high cholesterol mental illness osteoporosis pain or difficulty passing urine seizures spinal cord metastasis stroke suicidal thoughts, plans, or attempt; a previous suicide attempt by you or a family member tobacco smoker unusual vaginal bleeding (women) an unusual or allergic reaction to leuprolide, benzyl alcohol, other medicines, foods, dyes, or preservatives pregnant or trying to get pregnant breast-feeding How should I use this medication? This medicine is for injection into a muscle or for injection under the skin. It is given by a health care professional in a hospital or clinic setting. The specific product will determine how it will be given to you. Make sure you understand which product you receive and how often you will receive it. Talk to your pediatrician regarding the use of this medicine in children. Special care may be needed. Overdosage: If you think you have taken too much of this medicine contact a poison control center or emergency room at once. NOTE: This medicine is only for you. Do not share this medicine with others. What if I miss a dose? It is important not to miss a dose. Call your  doctor or health care professional if you are unable to keep an appointment. Depot injections: Depot injections are given either once-monthly, every 12 weeks, every 16 weeks, or every 24 weeks depending on the product you are prescribed. The product you are prescribed will be based on if you are female or female, and your condition. Make sure you understand your product and dosing. What may interact with this medication? Do not take this medicine with any of the following medications: chasteberry cisapride dronedarone pimozide thioridazine This medicine may also interact with the following medications: herbal or dietary supplements, like black cohosh or DHEA female hormones, like estrogens or progestins and birth control pills, patches, rings, or injections female hormones, like testosterone other medicines that prolong the QT interval (abnormal heart rhythm) This list may not describe all possible interactions. Give your health care provider a list of all the medicines, herbs, non-prescription drugs, or dietary supplements you use. Also tell them if you smoke, drink alcohol, or use illegal drugs. Some items may interact with your medicine. What should I watch for while using this medication? Visit your doctor or health care professional for regular checks on your progress. During the first weeks of treatment, your symptoms may get worse, but then will improve as you continue your treatment. You may get hot flashes, increased bone pain, increased difficulty passing urine, or an aggravation of nerve symptoms. Discuss these effects with your doctor or health care professional, some of them may improve with continued use of this medicine. Female patients may experience a menstrual cycle or spotting during the first months of therapy with this medicine. If this continues, contact your doctor or   health care professional. This medicine may increase blood sugar. Ask your healthcare provider if changes in diet  or medicines are needed if you have diabetes. What side effects may I notice from receiving this medication? Side effects that you should report to your doctor or health care professional as soon as possible: allergic reactions like skin rash, itching or hives, swelling of the face, lips, or tongue breathing problems chest pain depression or memory disorders pain in your legs or groin pain at site where injected or implanted seizures severe headache signs and symptoms of high blood sugar such as being more thirsty or hungry or having to urinate more than normal. You may also feel very tired or have blurry vision swelling of the feet and legs suicidal thoughts or other mood changes visual changes vomiting Side effects that usually do not require medical attention (report to your doctor or health care professional if they continue or are bothersome): breast swelling or tenderness decrease in sex drive or performance diarrhea hot flashes loss of appetite muscle, joint, or bone pains nausea redness or irritation at site where injected or implanted skin problems or acne This list may not describe all possible side effects. Call your doctor for medical advice about side effects. You may report side effects to FDA at 1-800-FDA-1088. Where should I keep my medication? This drug is given in a hospital or clinic and will not be stored at home. NOTE: This sheet is a summary. It may not cover all possible information. If you have questions about this medicine, talk to your doctor, pharmacist, or health care provider.  2023 Elsevier/Gold Standard (2021-08-27 00:00:00)  

## 2022-04-08 DIAGNOSIS — N946 Dysmenorrhea, unspecified: Secondary | ICD-10-CM | POA: Diagnosis not present

## 2022-04-08 DIAGNOSIS — K769 Liver disease, unspecified: Secondary | ICD-10-CM | POA: Diagnosis not present

## 2022-04-08 DIAGNOSIS — K760 Fatty (change of) liver, not elsewhere classified: Secondary | ICD-10-CM | POA: Diagnosis not present

## 2022-04-08 DIAGNOSIS — F329 Major depressive disorder, single episode, unspecified: Secondary | ICD-10-CM | POA: Diagnosis not present

## 2022-04-08 DIAGNOSIS — G8929 Other chronic pain: Secondary | ICD-10-CM | POA: Diagnosis not present

## 2022-04-08 DIAGNOSIS — R109 Unspecified abdominal pain: Secondary | ICD-10-CM | POA: Diagnosis not present

## 2022-04-08 DIAGNOSIS — F411 Generalized anxiety disorder: Secondary | ICD-10-CM | POA: Diagnosis not present

## 2022-04-08 DIAGNOSIS — K529 Noninfective gastroenteritis and colitis, unspecified: Secondary | ICD-10-CM | POA: Diagnosis not present

## 2022-04-08 DIAGNOSIS — Q615 Medullary cystic kidney: Secondary | ICD-10-CM | POA: Diagnosis not present

## 2022-04-08 DIAGNOSIS — N200 Calculus of kidney: Secondary | ICD-10-CM | POA: Diagnosis not present

## 2022-04-15 ENCOUNTER — Encounter: Payer: Self-pay | Admitting: Obstetrics

## 2022-04-18 ENCOUNTER — Other Ambulatory Visit: Payer: Self-pay | Admitting: Obstetrics

## 2022-04-18 MED ORDER — FLUCONAZOLE 150 MG PO TABS
150.0000 mg | ORAL_TABLET | Freq: Once | ORAL | 1 refills | Status: AC
Start: 2022-04-18 — End: 2022-04-18

## 2022-04-21 DIAGNOSIS — R160 Hepatomegaly, not elsewhere classified: Secondary | ICD-10-CM | POA: Diagnosis not present

## 2022-04-21 DIAGNOSIS — K760 Fatty (change of) liver, not elsewhere classified: Secondary | ICD-10-CM | POA: Diagnosis not present

## 2022-04-28 DIAGNOSIS — F411 Generalized anxiety disorder: Secondary | ICD-10-CM | POA: Diagnosis not present

## 2022-04-28 DIAGNOSIS — F331 Major depressive disorder, recurrent, moderate: Secondary | ICD-10-CM | POA: Diagnosis not present

## 2022-04-28 DIAGNOSIS — F900 Attention-deficit hyperactivity disorder, predominantly inattentive type: Secondary | ICD-10-CM | POA: Diagnosis not present

## 2022-05-05 ENCOUNTER — Ambulatory Visit (INDEPENDENT_AMBULATORY_CARE_PROVIDER_SITE_OTHER): Payer: Medicaid Other | Admitting: Obstetrics and Gynecology

## 2022-05-05 ENCOUNTER — Encounter: Payer: Self-pay | Admitting: Obstetrics and Gynecology

## 2022-05-05 VITALS — BP 104/80 | Ht 60.0 in | Wt 236.0 lb

## 2022-05-05 DIAGNOSIS — R102 Pelvic and perineal pain: Secondary | ICD-10-CM | POA: Diagnosis not present

## 2022-05-05 DIAGNOSIS — G8929 Other chronic pain: Secondary | ICD-10-CM

## 2022-05-05 MED ORDER — GABAPENTIN 300 MG PO CAPS: ORAL_CAPSULE | ORAL | 0 refills | Status: DC

## 2022-05-05 NOTE — Patient Instructions (Signed)
I value your feedback and you entrusting us with your care. If you get a  patient survey, I would appreciate you taking the time to let us know about your experience today. Thank you! ? ? ?

## 2022-05-05 NOTE — Progress Notes (Signed)
Sandra Gibbs, [place]   Chief Complaint  Patient presents with   Follow-up    Still having pelvic pain    HPI:      Sandra Gibbs is a 26 y.o. G1P1001 whose LMP was No LMP recorded., presents today for f/u for CPP after Lupron started 04/06/22; pt still with severe abd sx. Pt started on lupron by Dr. Okey Dupre (no longer at our practice) who wanted her to f/u in 6 wks and get f/u u/s from 6/23. She and pt would then discuss dx lap if still having severe sx. Pt can no longer work due to pain; takes percocet very sparingly when can't tolerate it anymore. #20 given 03/31/22 and pt still has 7-10 pills left. Has had some spotting this month with lupron and had even worse dysmen when menses was due recently. Is always hot so doesn't notice increased vasomotor sx. Has had bilat upper leg pain, coming from pelvis and radiating to thighs, since starting lupron. Is improved with percocet if taken, otherwise, sx come and go.    Had neg EGD with GI recently; having increased IBS sx and GI MD thinks might be endometriosis on intestines/abd cavity. No IBS improvement with GI meds that worked before.   Pt saw pain mgmt in past who gave her gabapentin 300 mg QD per pt without sx relief. Was seeing pain MD at [facility] and would like referral elsewhere. Pt upset because MDs/ED think she is drug seeker when in actuality, takes very little of pain Rx and is in significant pain. No one has answers for her re: cause of pain. She would like to proceed with surgery.   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   Renal calculi 12/07/2019   Irritable bowel syndrome without diarrhea 12/07/2019   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   Chondral lesion 09/10/2019   Patellofemoral disorder of left knee 09/10/2019   Generalized anxiety disorder 08/13/2019   MDD (major depressive disorder), recurrent episode, moderate (HCC) 08/13/2019   OSA (obstructive sleep apnea) 07/04/2017   Chronic daily headache 05/17/2017   Obesity (BMI 35.0-39.9 without comorbidity) 05/17/2017   Hematochezia 01/21/2015   Flank pain 01/07/2015   Elevated transaminase level 01/07/2015   S/P laparoscopic cholecystectomy 01/01/2015   Gall bladder stones 12/24/2014   Recurrent biliary colic 12/24/2014    Past Surgical History:  Procedure Laterality Date   CYST REMOVAL TRUNK  2019   GALLBLADDER SURGERY  2016   KIDNEY SURGERY     LEFT HEART CATH AND CORONARY ANGIOGRAPHY N/A 01/01/2021   Procedure: LEFT HEART CATH AND CORONARY ANGIOGRAPHY;  Surgeon: Alwyn Pea, MD;  Location: [facility] INVASIVE CV LAB;  Service: Cardiovascular;  Laterality: N/A;   OVARIAN CYST SURGERY  2011   TONSILLECTOMY      Family History  Problem Relation Age of Onset   Heart murmur Mother    Mitral valve prolapse Mother    Heart attack Father    Atrial fibrillation Maternal Grandfather  Stroke Paternal Grandmother    Mitral valve prolapse Paternal Grandmother     Social History   Socioeconomic History   Marital status: Married    Spouse name: Not on file   Number of children: Not on file   Years of education: Not on file   Highest education level: Not on file  Occupational History   Not on file  Tobacco Use   Smoking status: Never   Smokeless tobacco: Never  Vaping Use   Vaping Use: Never used  Substance and Sexual Activity   Alcohol use: No   Drug use: No   Sexual activity: Yes    Birth  control/protection: None  Other Topics Concern   Not on file  Social History Narrative   Not on file   Social Determinants of Health   Financial Resource Strain: Not on file  Food Insecurity: Not on file  Transportation Needs: Not on file  Physical Activity: Not on file  Stress: Not on file  Social Connections: Not on file  Intimate Partner Violence: Not on file    Outpatient Medications Prior to Visit  Medication Sig Dispense Refill   fluconazole (DIFLUCAN) 150 MG tablet Take 150 mg by mouth once.     FLUoxetine (PROZAC) 10 MG capsule Take by mouth.     FLUoxetine (PROZAC) 20 MG capsule Take 40 mg by mouth.     hydrOXYzine (ATARAX) 50 MG tablet SMARTSIG:0.5-1 Tablet(s) By Mouth PRN     Hyoscyamine Sulfate SL 0.125 MG SUBL Place under the tongue.     leuprolide (LUPRON DEPOT, 65-MONTH,) 11.25 MG injection Inject 11.25 mg into the muscle every 3 (three) months. 1 each 0   LORazepam (ATIVAN) 0.5 MG tablet Take 0.5 mg by mouth as needed.     oxyCODONE-acetaminophen (PERCOCET) 5-325 MG tablet Take 1 tablet by mouth every 6 (six) hours as needed. 20 tablet 0   busPIRone (BUSPAR) 30 MG tablet Take 30 mg by mouth 2 (two) times daily.     CONCERTA 27 MG CR tablet Take 27 mg by mouth at bedtime.     gabapentin (NEURONTIN) 300 MG capsule Take 300 mg by mouth 3 (three) times daily.     LORazepam (ATIVAN) 1 MG tablet Take 1 tablet (1 mg total) by mouth every 8 (eight) hours as needed. 10 tablet 0   norethindrone (AYGESTIN) 5 MG tablet Take 1 tablet (5 mg total) by mouth daily. 30 tablet 2   No facility-administered medications prior to visit.      ROS:  Review of Systems  Constitutional:  Negative for fever.  Gastrointestinal:  Negative for blood in stool, constipation, diarrhea, nausea and vomiting.  Genitourinary:  Positive for dyspareunia and pelvic pain. Negative for dysuria, flank pain, frequency, hematuria, urgency, vaginal bleeding, vaginal discharge and vaginal pain.   Musculoskeletal:  Negative for back pain.  Skin:  Negative for rash.  Psychiatric/Behavioral:  Positive for agitation and dysphoric mood.     OBJECTIVE:   Vitals:  BP 104/80   Ht 5' (1.524 m)   Wt 236 lb (107 kg)   BMI 46.09 kg/m   Physical Exam Vitals reviewed.  Constitutional:      Appearance: She is well-developed.  Pulmonary:     Effort: Pulmonary effort is normal.  Musculoskeletal:        General: Normal range of motion.     Cervical back: Normal range of motion.  Skin:    General: Skin is warm and dry.  Neurological:  General: No focal deficit present.     Mental Status: She is alert and oriented to person, place, and time.     Cranial Nerves: No cranial nerve deficit.  Psychiatric:        Mood and Affect: Mood normal.        Behavior: Behavior normal.        Thought Content: Thought content normal.        Judgment: Judgment normal.     Assessment/Plan: Chronic pelvic pain in female - Plan: gabapentin (NEURONTIN) 300 MG capsule, US PELVIC COMPLETE WITH TRANSVAGINAL, Ambulatory referral to Pain Clinic; recheck Gyn u/s per Dr. Okey Dupre; will f/u with results. Refer to Dr. Logan Bores for dx lap consult and surgery scheduling. Referral to pain mgmt at [facility], restart gabapentin but taper up to 300 mg TID for sx. Can increase further prn, still has remaining percocet prn sx.    Meds ordered this encounter  Medications   gabapentin (NEURONTIN) 300 MG capsule    Sig: Take 1 tab daily for 1 day, then BID for 1 day, then TID    Dispense:  90 capsule    Refill:  0    Order Specific Question:   Supervising Provider    Answer:   Hildred Laser [[id number]]      Return in about 1 week (around 05/12/2022) for with Dr. Logan Bores for dx lap consult for CPP.  Zylen Wenig B. Ravyn Nikkel, PA-C 05/06/2022 2:39 PM

## 2022-05-06 ENCOUNTER — Telehealth: Payer: Self-pay

## 2022-05-06 ENCOUNTER — Encounter: Payer: Self-pay | Admitting: Obstetrics and Gynecology

## 2022-05-06 NOTE — Telephone Encounter (Signed)
Pt called triage reporting an extreme pain flair up, requesting a refill on the Percocet. Please advise

## 2022-05-06 NOTE — Telephone Encounter (Signed)
Pt had 7 percocet remaining yesterday (per pt), needs to take those.

## 2022-05-09 NOTE — Telephone Encounter (Signed)
Called pt no answer, left message to return call.  

## 2022-05-09 NOTE — Telephone Encounter (Signed)
Pt should have started her gabapentin for pain and was supposed to schedule appt with Dr. Logan Bores when she checked out.

## 2022-05-09 NOTE — Telephone Encounter (Signed)
Pt returned the call.  Said that she only has 2 percocets left.  She is still in great pain.  She also wants to know if she needs to make another appt since her symptoms are not getting better.

## 2022-05-10 ENCOUNTER — Encounter: Payer: Self-pay | Admitting: Obstetrics and Gynecology

## 2022-05-10 NOTE — Telephone Encounter (Signed)
Done on Mychart message

## 2022-05-10 NOTE — Telephone Encounter (Signed)
Pt called to confirm appt with [facility].

## 2022-05-12 ENCOUNTER — Other Ambulatory Visit: Payer: Self-pay | Admitting: Obstetrics and Gynecology

## 2022-05-12 ENCOUNTER — Encounter: Payer: Self-pay | Admitting: Obstetrics and Gynecology

## 2022-05-12 ENCOUNTER — Telehealth: Payer: Self-pay

## 2022-05-12 DIAGNOSIS — G8929 Other chronic pain: Secondary | ICD-10-CM

## 2022-05-12 MED ORDER — OXYCODONE-ACETAMINOPHEN 5-325 MG PO TABS: 1.0000 | ORAL_TABLET | Freq: Four times a day (QID) | ORAL | 0 refills | Status: DC | PRN

## 2022-05-12 NOTE — Telephone Encounter (Signed)
Pt calling triage reporting bad pelvic pain, pelvic pressure, back pain, leg pain around 3am she passed a clot around the size of a quarter, she states that she has been speaking to [facility] about this and wants to know what to do about the pain. Please advise.

## 2022-05-12 NOTE — Telephone Encounter (Signed)
Messaging pt through [employer]

## 2022-05-12 NOTE — Progress Notes (Signed)
Rx RF percocet #10 due to severe pain. Has appt with Dr. Logan Bores for surg consult next wk. Has pain clinic ref, on gabapentin

## 2022-05-19 ENCOUNTER — Ambulatory Visit: Payer: Medicaid Other | Admitting: Obstetrics and Gynecology

## 2022-05-20 ENCOUNTER — Encounter: Payer: Self-pay | Admitting: Obstetrics and Gynecology

## 2022-05-20 ENCOUNTER — Ambulatory Visit (INDEPENDENT_AMBULATORY_CARE_PROVIDER_SITE_OTHER): Payer: Medicaid Other | Admitting: Obstetrics and Gynecology

## 2022-05-20 VITALS — BP 114/80 | Ht 60.0 in | Wt 232.0 lb

## 2022-05-20 DIAGNOSIS — R112 Nausea with vomiting, unspecified: Secondary | ICD-10-CM

## 2022-05-20 DIAGNOSIS — R102 Pelvic and perineal pain: Secondary | ICD-10-CM

## 2022-05-20 DIAGNOSIS — G8929 Other chronic pain: Secondary | ICD-10-CM | POA: Diagnosis not present

## 2022-05-20 NOTE — Progress Notes (Signed)
Patient presents today due to chronic pelvic pain. She states this pain has been ongoing for approximately 1 year, getting worse more recently. She has been nauseated and vomiting over the past week, taking sublingual Zofran as needed. Patient would like to discuss lap procedure for possible endometriosis. No other concerns at this time.

## 2022-05-20 NOTE — Progress Notes (Signed)
Encounter opened in error. KW 

## 2022-05-20 NOTE — Progress Notes (Signed)
HPI:      [name] is a 26 y.o. G1P0101 who LMP was No LMP recorded (lmp unknown).  Subjective:   She presents today to discuss her chronic pelvic pain.  Patient has had pelvic pain for several years and it seems to be getting worse.  Complicating this matter is the fact that she has IBS.  Her pain is noncyclic.  She has previously tried OCPs without success.  She is currently approximately 6 weeks into Lupron.  She reports that over the last 2 weeks her pain has become worse sometimes inducing nausea and vomiting.  She reports that she has had multiple workups including ultrasounds and MRIs upper and lower GIs and bladder cystoscopy without a diagnosis. She has 1 previous pregnancy and reports that she had no trouble becoming pregnant 8 years ago. She does report irregular cycles even when not on Lupron.    Hx: The following portions of the patient's history were reviewed and updated as appropriate:             She  has a past medical history of ADHD and Kidney stones. She does not have any pertinent problems on file. She  has a past surgical history that includes Ovarian cyst surgery (2011); Gallbladder surgery (2016); Cyst removal trunk (2019); Tonsillectomy; Kidney surgery; and LEFT HEART CATH AND CORONARY ANGIOGRAPHY (N/A, 01/01/2021). Her family history includes Atrial fibrillation in her maternal grandfather; Heart attack in her father; Heart murmur in her mother; Mitral valve prolapse in her mother and paternal grandmother; Stroke in her paternal grandmother. She  reports that she has never smoked. She has never used smokeless tobacco. She reports that she does not drink alcohol and does not use drugs. She has a current medication list which includes the following prescription(s): fluoxetine, fluoxetine, gabapentin, hydroxyzine, hyoscyamine sulfate sl, lupron depot (52-month), lorazepam, ondansetron, oxycodone-acetaminophen, and [DISCONTINUED] dicyclomine. She is allergic to wound  dressing adhesive, wound dressings, latex, and morphine.       Review of Systems:  Review of Systems  Constitutional: Denied constitutional symptoms, night sweats, recent illness, fatigue, fever, insomnia and weight loss.  Eyes: Denied eye symptoms, eye pain, photophobia, vision change and visual disturbance.  Ears/Nose/Throat/Neck: Denied ear, nose, throat or neck symptoms, hearing loss, nasal discharge, sinus congestion and sore throat.  Cardiovascular: Denied cardiovascular symptoms, arrhythmia, chest pain/pressure, edema, exercise intolerance, orthopnea and palpitations.  Respiratory: Denied pulmonary symptoms, asthma, pleuritic pain, productive sputum, cough, dyspnea and wheezing.  Gastrointestinal: Denied, gastro-esophageal reflux, melena, nausea and vomiting.  Genitourinary: See HPI for additional information.  Musculoskeletal: Denied musculoskeletal symptoms, stiffness, swelling, muscle weakness and myalgia.  Dermatologic: Denied dermatology symptoms, rash and scar.  Neurologic: Denied neurology symptoms, dizziness, headache, neck pain and syncope.  Psychiatric: Denied psychiatric symptoms, anxiety and depression.  Endocrine: Denied endocrine symptoms including hot flashes and night sweats.   Meds:   Current Outpatient Medications on File Prior to Visit  Medication Sig Dispense Refill   FLUoxetine (PROZAC) 10 MG capsule Take by mouth.     FLUoxetine (PROZAC) 20 MG capsule Take 40 mg by mouth.     gabapentin (NEURONTIN) 300 MG capsule Take 1 tab daily for 1 day, then BID for 1 day, then TID 90 capsule 0   hydrOXYzine (ATARAX) 50 MG tablet SMARTSIG:0.5-1 Tablet(s) By Mouth PRN     Hyoscyamine Sulfate SL 0.125 MG SUBL Place under the tongue.     leuprolide (LUPRON DEPOT, 85-MONTH,) 11.25 MG injection Inject 11.25 mg into the muscle  every 3 (three) months. 1 each 0   LORazepam (ATIVAN) 0.5 MG tablet Take 0.5 mg by mouth as needed.     ondansetron (ZOFRAN) 4 MG tablet Take 4 mg by  mouth every 8 (eight) hours as needed for nausea or vomiting.     oxyCODONE-acetaminophen (PERCOCET) 5-325 MG tablet Take 1 tablet by mouth every 6 (six) hours as needed. 10 tablet 0   [DISCONTINUED] dicyclomine (BENTYL) 10 MG capsule   0   No current facility-administered medications on file prior to visit.      Objective:     Vitals:   05/20/22 0828  BP: 114/80   Filed Weights   05/20/22 0828  Weight: 232 lb (105.2 kg)              Ultrasound results reviewed          Assessment:    G1P0101 Patient Active Problem List   Diagnosis Date Noted   Personal history of kidney stones 12/31/2021   Vomiting 10/06/2021   Dysuria 10/06/2021   Hair follicle infection 10/06/2021   Need for vaccination 01/13/2021   Takotsubo cardiomyopathy 01/01/2021   Acute systolic CHF (congestive heart failure) (HCC) 01/01/2021   Pyelonephritis 12/31/2020   Diabetes mellitus without complication (HCC)    Elevated troponin    Laxity of left anterior cruciate ligament 07/21/2020   Attention deficit hyperactivity disorder (ADHD), 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   Renal calculi 12/07/2019   Irritable bowel syndrome without diarrhea 12/07/2019   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 Chronic pelvic pain in female   2. Nausea and vomiting in adult     Possible endometriosis-patient on Lupron.  Possible initial Lupron effect  of worsening pelvic pain before it works.  If Lupron does not work there is a chance that it is not endometriosis as suspected.  Possible internal adhesions versus IBS.   Plan:            1.  Endometriosis We have discussed endometriosis in detail.  I informed her that endometriosis is a life-long, often progressive disease that persists until menopause in many people.  I told her that endometriosis is often affected by hormones and that certain conditions resulting in changes of these hormones can treat endometriosis.  We have discussed the resultant inflammation, adhesions and damage to internal organs that can occur with endometriosis.  We have discussed the decreased fertility often caused by this disease.  The type and timing of pain of endometriosis has also been discussed.  The possible treatment options from expectant management through major surgery have been reviewed, and each appropriate treatment discussed individually in relation to the extent of endometriosis.  I have given her literature on endometriosis and answered all her questions.  We have discussed the possible use of Myfembree if Lupron fails.  Patient will likely decline this  We have also discussed possible exploratory laparoscopy because patient "just wants a diagnosis".   Orders No orders of the defined types  were placed in this encounter.   No orders of the defined types were placed in this encounter.     F/U  Return in about 6 weeks (around 07/01/2022). I spent 30 minutes involved in the care of this patient preparing to see the patient by obtaining and reviewing her medical history (including labs, imaging tests and prior procedures), documenting clinical information in the electronic health record (EHR), counseling and coordinating care plans, writing and sending prescriptions, ordering tests or procedures and in direct communicating with the patient and medical staff discussing pertinent items from her history and  physical exam.  Elonda Husky, M.D. 05/20/2022 9:11 AM

## 2022-05-23 ENCOUNTER — Telehealth: Payer: Self-pay | Admitting: Internal Medicine

## 2022-05-23 DIAGNOSIS — G8929 Other chronic pain: Secondary | ICD-10-CM

## 2022-05-23 NOTE — Telephone Encounter (Signed)
Received mychart message from patient stating she has a "pinching randomly in my chest"  LVM to  offer her an appt today with Cadence Fransico Michael, [place]

## 2022-05-23 NOTE — Telephone Encounter (Signed)
Left voicemail message that we would like to schedule her appointment for today or tomorrow and to give [place] a call back.

## 2022-05-24 NOTE — Telephone Encounter (Signed)
New order placed. Thx.

## 2022-05-24 NOTE — Telephone Encounter (Signed)
Is order already placed for [facility] or do I need to add that? Can't remember what we did. Thx.

## 2022-05-26 ENCOUNTER — Other Ambulatory Visit: Payer: Self-pay

## 2022-05-26 ENCOUNTER — Emergency Department: Payer: Medicaid Other

## 2022-05-26 ENCOUNTER — Telehealth: Payer: Self-pay

## 2022-05-26 ENCOUNTER — Encounter: Payer: Self-pay | Admitting: Obstetrics and Gynecology

## 2022-05-26 ENCOUNTER — Emergency Department
Admission: EM | Admit: 2022-05-26 | Discharge: 2022-05-26 | Disposition: A | Discharge status: Home / Self Care | Payer: Medicaid Other | Attending: Emergency Medicine | Admitting: Emergency Medicine

## 2022-05-26 DIAGNOSIS — R102 Pelvic and perineal pain: Secondary | ICD-10-CM | POA: Diagnosis present

## 2022-05-26 DIAGNOSIS — N939 Abnormal uterine and vaginal bleeding, unspecified: Secondary | ICD-10-CM | POA: Insufficient documentation

## 2022-05-26 LAB — CBC
HCT: 43.6 % (ref 36.0–46.0)
Hemoglobin: 15 g/dL (ref 12.0–15.0)
MCH: 28.7 pg (ref 26.0–34.0)
MCHC: 34.4 g/dL (ref 30.0–36.0)
MCV: 83.5 fL (ref 80.0–100.0)
Platelets: 359 10*3/uL (ref 150–400)
RBC: 5.22 MIL/uL — ABNORMAL HIGH (ref 3.87–5.11)
RDW: 12 % (ref 11.5–15.5)
WBC: 7.3 10*3/uL (ref 4.0–10.5)
nRBC: 0 % (ref 0.0–0.2)

## 2022-05-26 LAB — URINALYSIS, ROUTINE W REFLEX MICROSCOPIC
Bilirubin Urine: NEGATIVE
Glucose, UA: NEGATIVE mg/dL
Ketones, ur: NEGATIVE mg/dL
Leukocytes,Ua: NEGATIVE
Nitrite: NEGATIVE
Protein, ur: NEGATIVE mg/dL
Specific Gravity, Urine: 1.016 (ref 1.005–1.030)
pH: 6 (ref 5.0–8.0)

## 2022-05-26 LAB — COMPREHENSIVE METABOLIC PANEL
ALT: 77 U/L — ABNORMAL HIGH (ref 0–44)
AST: 38 U/L (ref 15–41)
Albumin: 4.1 g/dL (ref 3.5–5.0)
Alkaline Phosphatase: 71 U/L (ref 38–126)
Anion gap: 8 (ref 5–15)
BUN: 11 mg/dL (ref 6–20)
CO2: 25 mmol/L (ref 22–32)
Calcium: 9.6 mg/dL (ref 8.9–10.3)
Chloride: 106 mmol/L (ref 98–111)
Creatinine, Ser: 0.72 mg/dL (ref 0.44–1.00)
GFR, Estimated: >60 mL/min (ref >60)
Glucose, Bld: 97 mg/dL (ref 70–99)
Potassium: 4.1 mmol/L (ref 3.5–5.1)
Sodium: 139 mmol/L (ref 135–145)
Total Bilirubin: 0.6 mg/dL (ref 0.3–1.2)
Total Protein: 7.5 g/dL (ref 6.5–8.1)

## 2022-05-26 LAB — LIPASE, BLOOD: Lipase: 36 U/L (ref 11–51)

## 2022-05-26 LAB — POC URINE PREG, ED: Preg Test, Ur: NEGATIVE

## 2022-05-26 MED ORDER — ONDANSETRON HCL 4 MG/2ML IJ SOLN
4.0000 mg | Freq: Once | INTRAMUSCULAR | Status: AC
  Administered 2022-05-26: 4 mg via INTRAVENOUS
  Filled 2022-05-26: qty 2

## 2022-05-26 MED ORDER — OXYCODONE HCL 5 MG PO TABS
10.0000 mg | ORAL_TABLET | Freq: Once | ORAL | Status: AC
  Administered 2022-05-26: 10 mg via ORAL
  Filled 2022-05-26: qty 2

## 2022-05-26 MED ORDER — IOHEXOL 300 MG/ML  SOLN
100.0000 mL | Freq: Once | INTRAMUSCULAR | Status: AC | PRN
  Administered 2022-05-26: 100 mL via INTRAVENOUS

## 2022-05-26 MED ORDER — ONDANSETRON 4 MG PO TBDP
4.0000 mg | ORAL_TABLET | Freq: Once | ORAL | Status: AC
Start: 2022-05-26 — End: 2022-05-26

## 2022-05-26 MED ORDER — ONDANSETRON 4 MG PO TBDP
ORAL_TABLET | ORAL | Status: AC
  Administered 2022-05-26: 4 mg via ORAL
  Filled 2022-05-26: qty 1

## 2022-05-26 MED ORDER — SODIUM CHLORIDE 0.9 % IV BOLUS
1000.0000 mL | Freq: Once | INTRAVENOUS | Status: AC
  Administered 2022-05-26: 1000 mL via INTRAVENOUS

## 2022-05-26 MED ORDER — MORPHINE SULFATE (PF) 4 MG/ML IV SOLN
4.0000 mg | Freq: Once | INTRAVENOUS | Status: AC
  Administered 2022-05-26: 4 mg via INTRAVENOUS
  Filled 2022-05-26: qty 1

## 2022-05-26 NOTE — ED Notes (Signed)
Green and purple tubes sent to the lab.

## 2022-05-26 NOTE — ED Triage Notes (Addendum)
Pt comes with c/o abdominal pain that started yesterday. Pt states vomiting as well. Pt states pain all over but worse on left side. Pt vomiting in triage. Pt states some vaginal bleeding as well with clots.

## 2022-05-26 NOTE — Telephone Encounter (Signed)
Does it usually take 1 month to start working?

## 2022-05-26 NOTE — ED Provider Notes (Addendum)
[facility] Provider Note    Event Date/Time   First MD Initiated Contact with Patient 05/26/22 [phone]     (approximate)   History   No chief complaint on file.   HPI  Sandra Gibbs is a 26 y.o. female   Past medical history of ovarian cysts, cystectomy, renal stones, Estelle June and depression who presents with pelvic pain.  She has had episodic pelvic pain that corresponds with her menstrual cycle chronically, followed by obstetrics and gynecology with a plan for medication changes, and possible surgical exploration for endometriosis and other causes.  Presents today with pelvic pain, left-sided, identical to prior episodes.  Has some nausea, and some vaginal bleeding with clots that is not too dissimilar from prior episodes.  Denies dysuria.  Bowel movements are at baseline, no bleeding.  No vaginal discharge.  No fever.  History was obtained via patient and her partner who is at bedside.      Physical Exam   Triage Vital Signs: ED Triage Vitals  Enc Vitals Group     BP 05/26/22 0855 (!) 140/103     Pulse Rate 05/26/22 0855 (!) 102     Resp 05/26/22 0855 18     Temp 05/26/22 0855 98.3 F (36.8 C)     Temp Source 05/26/22 0855 Oral     SpO2 05/26/22 0855 97 %     Weight --      Height --      Head Circumference --      Peak Flow --      Pain Score 05/26/22 0902 7     Pain Loc --      Pain Edu? --      Excl. in GC? --     Most recent vital signs: Vitals:   05/26/22 1134 05/26/22 1200  BP: 120/77 110/68  Pulse: 86 73  Resp: 20   Temp:    SpO2: 97% 97%    General: Awake, no distress.  CV:  Good peripheral perfusion.  Resp:  Normal effort.  Abd:  No distention.  Mild left lower quadrant/pelvic tenderness to palpation. Other:  Toxic appearing, comfortable.   ED Results / Procedures / Treatments   Labs (all labs ordered are listed, but only abnormal results are displayed) Labs Reviewed  COMPREHENSIVE METABOLIC PANEL - Abnormal;  Notable for the following components:      Result Value   ALT 77 (*)    All other components within normal limits  CBC - Abnormal; Notable for the following components:   RBC 5.22 (*)    All other components within normal limits  URINALYSIS, ROUTINE W REFLEX MICROSCOPIC - Abnormal; Notable for the following components:   Color, Urine YELLOW (*)    APPearance CLEAR (*)    Hgb urine dipstick LARGE (*)    Bacteria, UA RARE (*)    All other components within normal limits  LIPASE, BLOOD  POC URINE PREG, ED     I reviewed labs and they are notable for negative pregnancy test.  UA is not consistent with urinary tract infection.   RADIOLOGY I dependently reviewed and interpreted ultrasound of the pelvis and see  no significant ovarian enlargement   PROCEDURES:  Critical Care performed: No  Procedures   MEDICATIONS ORDERED IN ED: Medications  ondansetron (ZOFRAN) injection 4 mg (4 mg Intravenous Given 05/26/22 1015)  morphine (PF) 4 MG/ML injection 4 mg (4 mg Intravenous Given 05/26/22 1015)  sodium chloride 0.9 % bolus  1,000 mL (0 mLs Intravenous Stopped 05/26/22 1321)  ondansetron (ZOFRAN-ODT) disintegrating tablet 4 mg (4 mg Oral Given 05/26/22 1357)  oxyCODONE (Oxy IR/ROXICODONE) immediate release tablet 10 mg (10 mg Oral Given 05/26/22 1356)  iohexol (OMNIPAQUE) 300 MG/ML solution 100 mL (100 mLs Intravenous Contrast Given 05/26/22 1347)     IMPRESSION / MDM / ASSESSMENT AND PLAN / ED COURSE  I reviewed the triage vital signs and the nursing notes.                              Differential diagnosis includes, but is not limited to, ovarian cyst, variant torsion, chronic pelvic pain related to menstrual cycle, mittelschmerz, stated though less likely acute intra-abdominal pathology diverticulitis, appendicitis, obstruction.    MDM: This is a patient with ongoing pelvic pain similar to prior, no changes today.  Recurrence of prior pain, will rule out emergencies like ovarian  torsion given history of ovarian cysts and tenderness to the left pelvic area.  History less consistent with infection or sexually transmitted infection, other acute abdominal pathology and chronicity and well appearance.  Transvaginal ultrasound without findings to explain this patient's pain.  She does have a history of kidney stones and now with left lower quadrant pain, refractory to pain medications, severe will CT scan abdomen and pelvis to assess for nephrolithiasis, diverticulitis or other intra-abdominal pathology.  CT negative.  Patient to be discharged with follow-up with [facility] and strict return precautions.  Patient's presentation is most consistent with acute presentation with potential threat to life or bodily function.     FINAL CLINICAL IMPRESSION(S) / ED DIAGNOSES   Final diagnoses:  Acute pelvic pain, female     Rx / DC Orders   ED Discharge Orders     None        Note:  This document was prepared using Dragon voice recognition software and may include unintentional dictation errors.    Pilar Jarvis, MD 05/26/22 9604    Pilar Jarvis, MD 05/26/22 1420

## 2022-05-26 NOTE — Telephone Encounter (Signed)
TRIAGE VOICEMAIL: Jill Alexanders (husband) reports patient called earlier about passing two clots ~1inch or more. Dr Logan Bores wasn't available at the time. They were unable to advise if she needed to go to ER. They set up an appointment with him. He reports the pain and clotting got worse and she was puking. They are at the ED and wanted to let Dr Logan Bores know. They are aware Dr. Logan Bores wanted to wait a bit, but husband thinks moving forward she really needs to figure out if she has Endometriosis or not so they can quit playing around.

## 2022-05-27 NOTE — Telephone Encounter (Signed)
Called and spoke with patient. Made her aware of the recommendation to discuss further options and a follow-up, currently scheduled for 06/10/22. She states continuing to take Lupron (almost for 9 weeks) but states she is currently have a menstrual cycle, on day 2 of clots and moderate-heavy bleeding. She states sending a MyChart message to [employer] because she prescribed the medication. Please advise if anything further needs to be done.

## 2022-06-01 ENCOUNTER — Telehealth: Payer: Self-pay | Admitting: Obstetrics and Gynecology

## 2022-06-01 NOTE — Telephone Encounter (Signed)
Reached out to pt to schedule appt with Dr. Logan Bores on 8/25 at either 8:00 or 8:25.  Left message for pt to call back to schedule.

## 2022-06-02 ENCOUNTER — Encounter: Payer: Self-pay | Admitting: Obstetrics and Gynecology

## 2022-06-02 NOTE — Telephone Encounter (Signed)
Pt reached out via MyChart.  [place] she is not going to be able to take the earlier appt that Dr. Logan Bores had on 8/25 at 8:00.

## 2022-06-02 NOTE — Telephone Encounter (Signed)
Attempted to reach pt about sooner appt with Dr. Logan Bores.  The available appt is on Friday, August 25 at 8:00.  Left message for pt to call back to reschedule the appt.

## 2022-06-05 ENCOUNTER — Other Ambulatory Visit: Payer: Self-pay | Admitting: Obstetrics and Gynecology

## 2022-06-05 DIAGNOSIS — G8929 Other chronic pain: Secondary | ICD-10-CM

## 2022-06-07 NOTE — Telephone Encounter (Signed)
Patient is scheduled for 06/10/22 with Dr. Logan Bores

## 2022-06-10 ENCOUNTER — Ambulatory Visit (INDEPENDENT_AMBULATORY_CARE_PROVIDER_SITE_OTHER): Payer: Medicaid Other | Admitting: Obstetrics and Gynecology

## 2022-06-10 ENCOUNTER — Encounter: Payer: Self-pay | Admitting: Obstetrics and Gynecology

## 2022-06-10 VITALS — BP 118/84 | Ht 60.0 in | Wt 236.6 lb

## 2022-06-10 DIAGNOSIS — G8929 Other chronic pain: Secondary | ICD-10-CM | POA: Diagnosis not present

## 2022-06-10 DIAGNOSIS — R102 Pelvic and perineal pain: Secondary | ICD-10-CM | POA: Diagnosis not present

## 2022-06-10 NOTE — Progress Notes (Signed)
HPI:      [name] is a 26 y.o. G1P0101 who LMP was Patient's last menstrual period was 05/21/2022 (exact date).  Subjective:   She presents today because she says that her Lupron (2 months) is not helping with her pelvic pain.  She also states that a few weeks ago she had several days of bleeding like a menstrual period.  She states that she still has a small amount of spotting but not enough to take medicine to fix. The big issue for her is that the Lupron does not seem to have changed her pelvic pain. She is known to have IBS and diarrhea daily but she says she has had this her whole life and she does not believe this is the source of her discomfort.  She also reports that she has previously had bladder cystoscopy without diagnosis.    Hx: The following portions of the patient's history were reviewed and updated as appropriate:             She  has a past medical history of ADHD and Kidney stones. She does not have any pertinent problems on file. She  has a past surgical history that includes Ovarian cyst surgery (2011); Gallbladder surgery (2016); Cyst removal trunk (2019); Tonsillectomy; Kidney surgery; and LEFT HEART CATH AND CORONARY ANGIOGRAPHY (N/A, 01/01/2021). Her family history includes Atrial fibrillation in her maternal grandfather; Heart attack in her father; Heart murmur in her mother; Mitral valve prolapse in her mother and paternal grandmother; Stroke in her paternal grandmother. She  reports that she has never smoked. She has never used smokeless tobacco. She reports that she does not drink alcohol and does not use drugs. She has a current medication list which includes the following prescription(s): buspirone, fluoxetine, hyoscyamine sulfate sl, lupron depot (56-month), lorazepam, ondansetron, oxycodone-acetaminophen, and [DISCONTINUED] dicyclomine. She is allergic to wound dressing adhesive, wound dressings, latex, and morphine.       Review of Systems:  Review of  Systems  Constitutional: Denied constitutional symptoms, night sweats, recent illness, fatigue, fever, insomnia and weight loss.  Eyes: Denied eye symptoms, eye pain, photophobia, vision change and visual disturbance.  Ears/Nose/Throat/Neck: Denied ear, nose, throat or neck symptoms, hearing loss, nasal discharge, sinus congestion and sore throat.  Cardiovascular: Denied cardiovascular symptoms, arrhythmia, chest pain/pressure, edema, exercise intolerance, orthopnea and palpitations.  Respiratory: Denied pulmonary symptoms, asthma, pleuritic pain, productive sputum, cough, dyspnea and wheezing.  Gastrointestinal: Denied, gastro-esophageal reflux, melena, nausea and vomiting.  Genitourinary: See HPI for additional information.  Musculoskeletal: Denied musculoskeletal symptoms, stiffness, swelling, muscle weakness and myalgia.  Dermatologic: Denied dermatology symptoms, rash and scar.  Neurologic: Denied neurology symptoms, dizziness, headache, neck pain and syncope.  Psychiatric: Denied psychiatric symptoms, anxiety and depression.  Endocrine: Denied endocrine symptoms including hot flashes and night sweats.   Meds:   Current Outpatient Medications on File Prior to Visit  Medication Sig Dispense Refill   busPIRone (BUSPAR) 10 MG tablet Take 10 mg by mouth 2 (two) times daily.     FLUoxetine (PROZAC) 10 MG capsule Take by mouth.     Hyoscyamine Sulfate SL 0.125 MG SUBL Place under the tongue.     leuprolide (LUPRON DEPOT, 71-MONTH,) 11.25 MG injection Inject 11.25 mg into the muscle every 3 (three) months. 1 each 0   LORazepam (ATIVAN) 0.5 MG tablet Take 0.5 mg by mouth as needed.     ondansetron (ZOFRAN) 4 MG tablet Take 4 mg by mouth every 8 (eight) hours as needed  for nausea or vomiting.     oxyCODONE-acetaminophen (PERCOCET) 5-325 MG tablet Take 1 tablet by mouth every 6 (six) hours as needed. 10 tablet 0   [DISCONTINUED] dicyclomine (BENTYL) 10 MG capsule   0   No current  facility-administered medications on file prior to visit.      Objective:     Vitals:   06/10/22 0942  BP: 118/84   Filed Weights   06/10/22 0942  Weight: 236 lb 9.6 oz (107.3 kg)                        Assessment:    G1P0101 Patient Active Problem List   Diagnosis Date Noted   Personal history of kidney stones 12/31/2021   Vomiting 10/06/2021   Dysuria [id number]   Hair follicle infection [id number]   Need for vaccination 01/13/2021   Takotsubo cardiomyopathy [id number]   Acute systolic CHF (congestive heart failure) ([place]) 01/01/2021   Pyelonephritis 12/31/2020   Diabetes mellitus without complication (HCC)    Elevated troponin    Laxity of left anterior cruciate ligament 07/21/2020   Attention deficit hyperactivity disorder (ADHD), 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   Renal calculi 12/07/2019   Irritable bowel syndrome without diarrhea 12/07/2019   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 Chronic pelvic pain in female   2. Pelvic pain in female     Patient was presumptively being treated for endometriosis/adenomyosis with use of Lupron.  She reports her pain has not changed over the last 2 months. Based on the fact that her pain does not change with Lupron or with Mirena IUD I strongly question the diagnosis of endometriosis or adenomyosis.  It seems as if her pain is not uterine related under the  circumstances.   Plan:            1.  She will consider other options over the next month.  If her pain resolves then perhaps she will get a second dose of Lupron.  Her bleeding seems to have declined and at this point she does not desire any medication to try to fix her bleeding.  We have discussed the use of add back medication to control symptoms and breakthrough bleeding.  She has declined this at this time.  Once she has made a decision about whether to receive her next dose of Lupron or continue a different work-up presumably bladder or bowel center and she will inform us or make an appointment. Orders No orders of the defined types were placed in this encounter.   No orders of the defined types were placed in this encounter.     F/U  No follow-ups on file. I spent 24 minutes involved in the care of this patient preparing to see the patient by obtaining and reviewing her medical history (including labs, imaging tests and prior procedures), documenting clinical information in the electronic health record (EHR), counseling and coordinating care plans, writing and sending prescriptions, ordering tests or  procedures and in direct communicating with the patient and medical staff discussing pertinent items from her history and physical exam.  Elonda Husky, M.D. 06/10/2022 10:20 AM

## 2022-06-10 NOTE — Progress Notes (Signed)
Patient presents today for follow-up regarding pelvic pain. She states while on Lupron she has been experiencing lots of clots and a heavy menstrual cycle. She states along with her cycle she has sharp pelvic pain and cramping, nausea, vomiting and headaches. She states no additional concerns today.

## 2022-06-16 ENCOUNTER — Ambulatory Visit (INDEPENDENT_AMBULATORY_CARE_PROVIDER_SITE_OTHER): Payer: Medicaid Other

## 2022-06-16 ENCOUNTER — Ambulatory Visit
Admission: EM | Admit: 2022-06-16 | Discharge: 2022-06-16 | Disposition: A | Discharge status: Home / Self Care | Payer: Medicaid Other | Attending: Emergency Medicine | Admitting: Emergency Medicine

## 2022-06-16 DIAGNOSIS — U071 COVID-19: Secondary | ICD-10-CM

## 2022-06-16 DIAGNOSIS — R0789 Other chest pain: Secondary | ICD-10-CM | POA: Diagnosis not present

## 2022-06-16 DIAGNOSIS — R079 Chest pain, unspecified: Secondary | ICD-10-CM | POA: Diagnosis not present

## 2022-06-16 MED ORDER — IPRATROPIUM BROMIDE 0.06 % NA SOLN: 2.0000 | Freq: Four times a day (QID) | NASAL | 0 refills | Status: DC

## 2022-06-16 MED ORDER — PROMETHAZINE-DM 6.25-15 MG/5ML PO SYRP: 5.0000 mL | ORAL_SOLUTION | Freq: Four times a day (QID) | ORAL | 0 refills | Status: DC | PRN

## 2022-06-16 MED ORDER — IBUPROFEN 600 MG PO TABS: 600.0000 mg | ORAL_TABLET | Freq: Four times a day (QID) | ORAL | 0 refills | Status: DC | PRN

## 2022-06-16 MED ORDER — MOLNUPIRAVIR EUA 200MG CAPSULE: 4.0000 | ORAL_CAPSULE | Freq: Two times a day (BID) | ORAL | 0 refills | Status: AC

## 2022-06-16 NOTE — Discharge Instructions (Addendum)
Your x-ray was normal.  Your EKG was nonspecific, however, it does not appear that you are having a heart attack.  Please go to to the ED if your chest pain returns.  Finish the [employer], even if you feel better.  Atrovent nasal spray, saline nasal irrigation with a Lloyd Huger Med rinse and distilled water as often as you want, Mucinex D for nasal congestion.  Promethazine DM for the cough.  600 mg of ibuprofen combined with 1000 mg of Tylenol together 3-4 times a day as needed for pain.

## 2022-06-16 NOTE — ED Triage Notes (Signed)
Pt states cough, sore throat and foggy ears x day 4. Tested positive for COVID at home.

## 2022-06-16 NOTE — ED Provider Notes (Signed)
HPI  SUBJECTIVE:  Sandra Gibbs is a 26 y.o. female who presents with 4 days of nasal congestion, rhinorrhea, bilateral ear popping, postnasal drip, sore throat, nonproductive cough.  States that she is coughing frequently, and is unable to sleep at night secondary to the cough.  She also reports nausea, vomiting, diarrhea, abdominal pain.  She had a positive home COVID test earlier today.  No fevers, body aches, headaches, loss of sense of smell or taste, wheezing, shortness of breath.  She reports a sharp, substernal episode of chest pain while in the department here.  It resolved after a few minutes.  It was accompanied with palpitations, shortness of breath.  She has had similar chest pain like this before when she had Takotsubo cardiomyopathy, but states that this pain is not as severe and does not radiate like it did when she had the cardiomyopathy.  She reports an episode of right sided sharp chest pain underneath her right breast that lasted 30 seconds to a minute yesterday, but this has since resolved and not returned.  No calf pain, swelling, hemoptysis.  She has been in bed a lot over the past few days, but has been getting up and moving around.  No known COVID exposure.  She had 2 doses of the COVID vaccine.  She took an antipyretic within 6 hours of evaluation.  She tried over-the-counter cold medications, rest, cough drops, Mucinex, Tylenol, generic Robitussin without improvement of symptoms.  Symptoms are worse with being up and around.  She has a past medical history of BMI above 30, to 2 sumo cardiomyopathy, COVID 2 times before, medullary sponge kidney.  No history of pulmonary disease, PE, DVT, diabetes, hypertension, chronic kidney disease.    Past Medical History:  Diagnosis Date   ADHD    Kidney stones     Past Surgical History:  Procedure Laterality Date   CYST REMOVAL TRUNK  2019   GALLBLADDER SURGERY  2016   KIDNEY SURGERY     LEFT HEART CATH AND CORONARY ANGIOGRAPHY N/A  01/01/2021   Procedure: LEFT HEART CATH AND CORONARY ANGIOGRAPHY;  Surgeon: Alwyn Pea, MD;  Location: [facility];  Service: Cardiovascular;  Laterality: N/A;   OVARIAN CYST SURGERY  2011   TONSILLECTOMY      Family History  Problem Relation Age of Onset   Heart murmur Mother    Mitral valve prolapse Mother    Heart attack Father    Atrial fibrillation Maternal Grandfather    Stroke Paternal Grandmother    Mitral valve prolapse Paternal Grandmother     Social History   Tobacco Use   Smoking status: Never   Smokeless tobacco: Never  Vaping Use   Vaping Use: Never used  Substance Use Topics   Alcohol use: No   Drug use: No    No current facility-administered medications for this encounter.  Current Outpatient Medications:    ibuprofen (ADVIL) 600 MG tablet, Take 1 tablet (600 mg total) by mouth every 6 (six) hours as needed., Disp: 30 tablet, Rfl: 0   ipratropium (ATROVENT) 0.06 % nasal spray, Place 2 sprays into both nostrils 4 (four) times daily., Disp: 15 mL, Rfl: 0   molnupiravir EUA (LAGEVRIO) 200 mg CAPS capsule, Take 4 capsules (800 mg total) by mouth 2 (two) times daily for 5 days., Disp: 40 capsule, Rfl: 0   promethazine-dextromethorphan (PROMETHAZINE-DM) 6.25-15 MG/5ML syrup, Take 5 mLs by mouth 4 (four) times daily as needed for cough., Disp: 118 mL,  Rfl: 0   busPIRone (BUSPAR) 10 MG tablet, Take 10 mg by mouth 2 (two) times daily., Disp: , Rfl:    busPIRone (BUSPAR) 15 MG tablet, SMARTSIG:1 Tablet(s) By Mouth Morning-Evening, Disp: , Rfl:    FLUoxetine (PROZAC) 10 MG capsule, Take by mouth., Disp: , Rfl:    Hyoscyamine Sulfate SL 0.125 MG SUBL, Place under the tongue., Disp: , Rfl:    leuprolide (LUPRON DEPOT, 91-MONTH,) 11.25 MG injection, Inject 11.25 mg into the muscle every 3 (three) months., Disp: 1 each, Rfl: 0   LORazepam (ATIVAN) 0.5 MG tablet, Take 0.5 mg by mouth as needed., Disp: , Rfl:    ondansetron (ZOFRAN) 4 MG tablet, Take 4 mg by  mouth every 8 (eight) hours as needed for nausea or vomiting., Disp: , Rfl:    ondansetron (ZOFRAN-ODT) 8 MG disintegrating tablet, Take by mouth., Disp: , Rfl:    oxyCODONE-acetaminophen (PERCOCET) 5-325 MG tablet, Take 1 tablet by mouth every 6 (six) hours as needed., Disp: 10 tablet, Rfl: 0  Allergies  Allergen Reactions   Wound Dressing Adhesive Itching, Other (See Comments) and Rash    Other: "tears skin off" Other: "tears skin off" Other: "tears skin off"  Other reaction(s): Other (see comments) Other: "tears skin off" Other: "tears skin off" Other: "tears skin off"   Wound Dressings Itching and Rash    Other reaction(s): Other (See Comments) Other: "tears skin off" Other: "tears skin off" Other: "tears skin off"   Latex Itching   Morphine Itching and Rash     ROS  As noted in HPI.   Physical Exam  BP 129/84 (BP Location: Right Arm)   Pulse 82   Temp 98.4 F (36.9 C) (Oral)   Resp 16   Ht 5' (1.524 m)   Wt 104.3 kg   LMP 05/21/2022 (Exact Date)   SpO2 99%   BMI 44.92 kg/m   Constitutional: Well developed, well nourished, no acute distress.  Coughing. Eyes:  EOMI, conjunctiva normal bilaterally HENT: Normocephalic, atraumatic,mucus membranes moist.  TMs normal bilaterally.  Positive nasal congestion.  No maxillary, frontal sinus tenderness.  Normal oropharynx, normal tonsils without exudates.  Positive postnasal drip. Neck: No appreciable cervical adenopathy Respiratory: Normal inspiratory effort, lungs clear bilaterally.  No anterior, lateral chest wall tenderness Cardiovascular: Normal rate, regular rhythm, no murmurs, rubs, gallops GI: nondistended skin: No rash, skin intact Musculoskeletal: Calves symmetric, nontender, no edema Neurologic: Alert & oriented x 3, no focal neuro deficits Psychiatric: Speech and behavior appropriate   ED Course   Medications - No data to display  Orders Placed This Encounter  Procedures   DG Chest 2 View     Standing Status:   Standing    Number of Occurrences:   1    Order Specific Question:   Reason for Exam (SYMPTOM  OR DIAGNOSIS REQUIRED)    Answer:   Substernal chest pain, history of Takotsubo cardiomyopathy.  COVID-positive.  Rule out pneumonia, change in heart size   ED EKG    Standing Status:   Standing    Number of Occurrences:   1    Order Specific Question:   Reason for Exam    Answer:   Chest Pain   EKG 12-Lead    Standing Status:   Standing    Number of Occurrences:   1   EKG 12-Lead    Standing Status:   Standing    Number of Occurrences:   1    No results found for this  or any previous visit (from the past 24 hour(s)). DG Chest 2 View  Result Date: 06/16/2022 CLINICAL DATA:  Substernal chest pain. EXAM: CHEST - 2 VIEW COMPARISON:  Chest radiograph February 05, 2021 FINDINGS: The heart size and mediastinal contours are within normal limits. Both lungs are clear. The visualized skeletal structures are unremarkable. IMPRESSION: No active cardiopulmonary disease. Electronically Signed   By: Annia Belt M.D.   On: 06/16/2022 19:47    ED Clinical Impression  1. COVID-19 virus infection   2. Chest pain, unspecified type      ED Assessment/Plan     Patient reports an episode of chest pain accompanied with palpitations while in the department.  It has resolved.  She has no reproducible chest wall tenderness, although she is coughing extensively.  Will check chest x-ray, EKG due to history of Takotsubo cardiomyopathy.  Deferring troponin as chest pain started while here in the department, and do not think that troponin would be positive at this point in time.  There is no evidence of a DVT.  Doubt PE, with normal vital signs, although this is in the differential as she is COVID-positive.   Reviewed imaging independently.  No acute cardiopulmonary disease.  No cardiomegaly as read by me.  See radiology report for full details.  EKG: Normal sinus rhythm, rate 88.  Normal axis, right  ventricular conduction delay.  No hypertrophy.  Nonspecific T wave abnormality.  No ST elevation compared to EKG from 04/04/2022.  EKG shows no signs of ischemia.  Chest x-ray is normal.  We discussed the risks and benefits of Paxlovid versus molnupiravir, she has opted for molnupiravir.  Will send home with Promethazine DM, Atrovent nasal spray, saline nasal occasion, Tylenol/ibuprofen, Mucinex D.  Return to the ED if chest pain returns.  Discussed EKG, imaging, MDM, treatment plan, and plan for follow-up with patient. Discussed sn/sx that should prompt return to the ED. patient agrees with plan.   Meds ordered this encounter  Medications   molnupiravir EUA (LAGEVRIO) 200 mg CAPS capsule    Sig: Take 4 capsules (800 mg total) by mouth 2 (two) times daily for 5 days.    Dispense:  40 capsule    Refill:  0   promethazine-dextromethorphan (PROMETHAZINE-DM) 6.25-15 MG/5ML syrup    Sig: Take 5 mLs by mouth 4 (four) times daily as needed for cough.    Dispense:  118 mL    Refill:  0   ipratropium (ATROVENT) 0.06 % nasal spray    Sig: Place 2 sprays into both nostrils 4 (four) times daily.    Dispense:  15 mL    Refill:  0   ibuprofen (ADVIL) 600 MG tablet    Sig: Take 1 tablet (600 mg total) by mouth every 6 (six) hours as needed.    Dispense:  30 tablet    Refill:  0      *This clinic note was created using Scientist, clinical (histocompatibility and immunogenetics). Therefore, there may be occasional mistakes despite careful proofreading.  ?    Domenick Gong, MD 06/16/22 2021

## 2022-06-23 ENCOUNTER — Ambulatory Visit: Payer: Medicaid Other | Admitting: Obstetrics and Gynecology

## 2022-06-30 ENCOUNTER — Ambulatory Visit: Payer: Medicaid Other | Admitting: Obstetrics and Gynecology

## 2022-07-01 ENCOUNTER — Encounter: Payer: Self-pay | Admitting: Obstetrics and Gynecology

## 2022-08-02 ENCOUNTER — Ambulatory Visit
Admission: EM | Admit: 2022-08-02 | Discharge: 2022-08-02 | Disposition: A | Discharge status: Home / Self Care | Payer: Medicaid Other

## 2022-08-02 ENCOUNTER — Encounter: Payer: Self-pay | Admitting: Emergency Medicine

## 2022-08-02 DIAGNOSIS — S161XXA Strain of muscle, fascia and tendon at neck level, initial encounter: Secondary | ICD-10-CM

## 2022-08-02 DIAGNOSIS — S060X9A Concussion with loss of consciousness of unspecified duration, initial encounter: Secondary | ICD-10-CM | POA: Diagnosis not present

## 2022-08-02 MED ORDER — TIZANIDINE HCL 4 MG PO TABS: 4.0000 mg | ORAL_TABLET | Freq: Every evening | ORAL | 0 refills | Status: AC | PRN

## 2022-08-02 MED ORDER — PROMETHAZINE HCL 25 MG PO TABS: ORAL_TABLET | ORAL | 0 refills | Status: DC

## 2022-08-02 MED ORDER — PREDNISONE 10 MG PO TABS: ORAL_TABLET | ORAL | 0 refills | Status: DC

## 2022-08-02 NOTE — Discharge Instructions (Signed)
NECK PAIN: Stressed avoiding painful activities. This can exacerbate your symptoms and make them worse.  May apply heat to the areas of pain for some relief. Use medications as directed. Be aware of which medications make you drowsy and do not drive or operate any kind of heavy machinery while using the medication (ie pain medications or muscle relaxers). F/U with PCP for reexamination or return sooner if condition worsens or does not begin to improve over the next few days.   NECK PAIN RED FLAGS: If symptoms get worse than they are right now, you should come back sooner for re-evaluation. If you have increased numbness/ tingling or notice that the numbness/tingling is affecting the legs or saddle region, go to ER. If you ever lose continence go to ER.      HEADACHE: You were seen in clinic today for headache. Rest and take meds as directed. If at any point, the headache becomes very severe, is associated with fever, is associated with neck pain/stiffness, you feel like passing out, the headache is different from any you've have had before, there are vision changes/issues with speech/issues with balance, or numbness/weakness in a part of the body, you should be seen urgently or emergently for more serious causes of headache   Follow-up with your PCP.  Inquire about a referral to neurology if symptoms or not improving.

## 2022-08-02 NOTE — ED Provider Notes (Signed)
[facility]    CSN: [id number] Arrival date & time: 08/02/22  [id number]      History   Chief Complaint Chief Complaint  Patient presents with   Neck Pain   Headache    HPI Sandra Gibbs is a 26 y.o. female presenting for continued headaches and neck pain following a accidental fall that occurred 2 weeks ago.  Patient reports she was having severe pelvic pain which she thinks may be related to possible undiagnosed endometriosis (is being followed by OB/GYN).  Reports that she went to the bathroom and while she was on the toilet she started to feel increased pain that made her feel dizzy and she fell forward and hit her head on the wall.  Reports loss of consciousness.  She says that her neck was turned when she fell.  She states that she did go to the emergency department and had a CT scan performed of her head and neck.  Reports that everything was normal.  She followed up with her PCP last week and was told that she likely had a concussion.  They advised her to take it easy.  Patient has taken cyclobenzaprine, ibuprofen and Tylenol but states none of the medications have helped.  She continues to have headache where she hit her head on the right frontal area and also left-sided neck pain and pain into her left trapezius and shoulder.  Reports increased pain in her neck when she tries to move it.  No radiation of pain down her upper extremities.  Has felt some dizziness and had nausea without vomiting.  No report of any photophobia or confusion.  Patient does also have a history of chronic headaches but says she has not had any issues with chronic daily headaches in a while.  No other complaints.  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/21/2020   Attention deficit hyperactivity disorder (ADHD), 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   Renal calculi 12/07/2019   Irritable bowel syndrome without diarrhea 12/07/2019   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   Chondral lesion 09/10/2019   Patellofemoral disorder of left knee 09/10/2019   Generalized anxiety disorder 08/13/2019   MDD (major depressive disorder), recurrent episode, moderate (HCC) 08/13/2019   OSA (obstructive sleep apnea) 07/04/2017   Chronic daily headache 05/17/2017   Obesity (BMI 35.0-39.9 without comorbidity) 05/17/2017   Hematochezia 01/21/2015   Flank pain 01/07/2015   Elevated transaminase level 01/07/2015   S/P laparoscopic cholecystectomy 01/01/2015   Gall bladder stones 12/24/2014   Recurrent biliary colic 12/24/2014    Past Surgical History:  Procedure Laterality Date   CYST REMOVAL TRUNK  2019   GALLBLADDER SURGERY  2016   KIDNEY SURGERY     LEFT HEART CATH AND CORONARY ANGIOGRAPHY N/A 01/01/2021   Procedure: LEFT HEART CATH AND CORONARY ANGIOGRAPHY;  Surgeon: Alwyn Peaallwood, Dwayne D, MD;  Location: [facility];  Service: Cardiovascular;  Laterality: N/A;   OVARIAN CYST SURGERY  2011   TONSILLECTOMY      OB History     Gravida  1   Para  1   Term  0   Preterm  1   AB      Living  1      SAB      IAB      Ectopic      Multiple      Live Births  1            Home Medications    Prior to Admission medications   Medication Sig Start Date End Date Taking? Authorizing Provider  busPIRone (BUSPAR) 15 MG tablet SMARTSIG:1 Tablet(s) By Mouth Morning-Evening 06/07/22  Yes [provider]  Hyoscyamine Sulfate SL 0.125 MG SUBL Place  under the tongue. 01/20/22  Yes [provider]  ibuprofen (ADVIL) 600 MG tablet Take 1 tablet (600 mg total) by mouth every 6 (six) hours as needed. 06/16/22  Yes Domenick Gong, MD  LORazepam (ATIVAN) 0.5 MG tablet Take 0.5 mg by mouth as needed. 04/28/22  Yes [provider]  ondansetron (ZOFRAN) 4 MG tablet Take 4 mg by mouth every 8 (eight) hours as needed for nausea or vomiting.   Yes [provider]  predniSONE (DELTASONE) 10 MG tablet Take 6 tabs p.o. on day 1 and decrease by 1 tablet daily until complete 08/02/22  Yes Shirlee Latch, PA-C  promethazine (PHENERGAN) 25 MG tablet Take half to 1 tablet every 6 hours as needed for dizziness, nausea 08/02/22  Yes Eusebio Friendly B, PA-C  tiZANidine (ZANAFLEX) 4 MG tablet Take 1 tablet (4 mg total) by mouth at bedtime as needed for up to 15 days for muscle spasms. 08/02/22 08/17/22 Yes Shirlee Latch, PA-C  busPIRone (BUSPAR) 10 MG tablet Take 10 mg by mouth 2 (two) times daily.    [provider]  dicyclomine (BENTYL) 20 MG tablet Take 20 mg by mouth 4 (four) times daily. 07/28/22   [provider]  DULoxetine (CYMBALTA) 20 MG capsule Take by mouth. 07/19/22   [provider]  FLUoxetine (PROZAC) 10 MG capsule Take by mouth. 05/02/22   [provider]  ipratropium (ATROVENT) 0.06 % nasal spray Place 2 sprays into both nostrils 4 (four) times daily. 06/16/22   Domenick Gong, MD  leuprolide (LUPRON DEPOT, 35-MONTH,) 11.25 MG injection Inject 11.25 mg into the muscle every 3 (three) months. 03/25/22   Horald Pollen, MD  ondansetron (ZOFRAN-ODT) 8 MG disintegrating tablet Take by mouth. 05/18/22   [provider]  oxyCODONE-acetaminophen (PERCOCET) 5-325 MG tablet Take 1 tablet by mouth every 6 (six) hours as needed. 05/12/22   Copland, Ilona Sorrel, PA-C  promethazine-dextromethorphan (PROMETHAZINE-DM) 6.25-15 MG/5ML syrup Take 5 mLs by mouth 4 (four) times daily as needed for cough.  06/16/22   Domenick Gong, MD  traZODone (DESYREL) 50 MG tablet Take by mouth. 06/16/22   [provider]    Family History Family History  Problem Relation Age of Onset   Heart murmur Mother    Mitral valve prolapse Mother    Heart attack Father    Atrial fibrillation Maternal Grandfather    Stroke Paternal Grandmother    Mitral valve prolapse Paternal Grandmother     Social History Social History   Tobacco Use   Smoking status: Never   Smokeless tobacco: Never  Vaping Use   Vaping Use: Never used  Substance Use Topics   Alcohol use: No   Drug use: No  Allergies   Wound dressing adhesive, Wound dressings, Latex, and Morphine   Review of Systems Review of Systems  Constitutional:  Negative for fatigue.  Eyes:  Negative for photophobia and visual disturbance.  Respiratory:  Negative for shortness of breath.   Cardiovascular:  Negative for chest pain and palpitations.  Gastrointestinal:  Positive for nausea. Negative for abdominal pain and vomiting.  Musculoskeletal:  Positive for neck pain and neck stiffness. Negative for back pain.  Skin:  Negative for wound.  Neurological:  Positive for dizziness, syncope (initially 2 weeks ago, not since) and headaches. Negative for tremors, seizures, facial asymmetry, speech difficulty, weakness and numbness.  Psychiatric/Behavioral:  Negative for confusion.      Physical Exam Triage Vital Signs ED Triage Vitals  Enc Vitals Group     BP 08/02/22 0908 107/73     Pulse Rate 08/02/22 0908 75     Resp 08/02/22 0908 16     Temp 08/02/22 0908 98.7 F (37.1 C)     Temp Source 08/02/22 0908 Oral     SpO2 08/02/22 0908 97 %     Weight --      Height --      Head Circumference --      Peak Flow --      Pain Score 08/02/22 0904 7     Pain Loc --      Pain Edu? --      Excl. in [place]? --    No data found.  Updated Vital Signs BP 107/73 (BP Location: Right Arm)   Pulse 75   Temp 98.7 F (37.1 C) (Oral)   Resp 16    LMP  (LMP Unknown)   SpO2 97%     Physical Exam Vitals and nursing note reviewed.  Constitutional:      General: She is not in acute distress.    Appearance: Normal appearance. She is not ill-appearing or toxic-appearing.  HENT:     Head: Normocephalic and atraumatic.     Nose: Nose normal.     Mouth/Throat:     Mouth: Mucous membranes are moist.     Pharynx: Oropharynx is clear.  Eyes:     General: No scleral icterus.       Right eye: No discharge.        Left eye: No discharge.     Extraocular Movements: Extraocular movements intact.     Conjunctiva/sclera: Conjunctivae normal.     Pupils: Pupils are equal, round, and reactive to light.  Cardiovascular:     Rate and Rhythm: Normal rate and regular rhythm.     Heart sounds: Normal heart sounds.  Pulmonary:     Effort: Pulmonary effort is normal. No respiratory distress.     Breath sounds: Normal breath sounds.  Musculoskeletal:     Cervical back: Normal range of motion and neck supple. Tenderness (no spinal tenderness. TTP left paracervical and trapezius muscles. Pain with movement) present.  Skin:    General: Skin is dry.  Neurological:     General: No focal deficit present.     Mental Status: She is alert and oriented to person, place, and time. Mental status is at baseline.     Cranial Nerves: No cranial nerve deficit.     Motor: No weakness.     Coordination: Coordination normal.     Gait: Gait normal.     Comments: 5 out of 5 strength bilateral upper and lower extremities  Psychiatric:  Mood and Affect: Mood normal.        Behavior: Behavior normal.        Thought Content: Thought content normal.      UC Treatments / Results  Labs (all labs ordered are listed, but only abnormal results are displayed) Labs Reviewed - No data to display  EKG   Radiology No results found.  Procedures Procedures (including critical care time)  Medications Ordered in UC Medications - No data to  display  Initial Impression / Assessment and Plan / UC Course  I have reviewed the triage vital signs and the nursing notes.  Pertinent labs & imaging results that were available during my care of the patient were reviewed by me and considered in my medical decision making (see chart for details).   26 year old female presents for 2-week history of headaches and neck pain.  Patient had a fall and syncopal episode.  The syncopal episode which occurred in response to pain caused her to hit her head on the wall 2 weeks ago.  She did have a CT of her head and neck at the emergency department at that time and everything came back normal.  Reports continued symptoms and also reports dizziness and nausea without vomiting.  Vitals normal and stable patient overall well-appearing.  Normal neurological exam.  Tenderness to palpation of the left paracervical and trapezius muscles.  Full range of motion of neck but pain with movement.  Suspect cervical strain/sprain and possible concussion.  We will try prednisone Dosepak at this time as well as tizanidine at bedtime for her neck pain.  Sent promethazine to pharmacy to see if that will help with her dizziness and nausea which is likely related to the concussion.  Patient also with a history of chronic headaches.  Unsure if this is playing a role in her current symptoms.  Supportive care advised.  Advised her to keep her follow-up appoint with her PCP which is in a couple weeks.  Advised to contact them sooner if symptoms are worsening.  ED precautions were reviewed.  Advised patient to discuss a referral to neurology with her PCP.   Final Clinical Impressions(s) / UC Diagnoses   Final diagnoses:  Strain of neck muscle, initial encounter  Concussion with loss of consciousness, initial encounter     Discharge Instructions      NECK PAIN: Stressed avoiding painful activities. This can exacerbate your symptoms and make them worse.  May apply heat to the  areas of pain for some relief. Use medications as directed. Be aware of which medications make you drowsy and do not drive or operate any kind of heavy machinery while using the medication (ie pain medications or muscle relaxers). F/U with PCP for reexamination or return sooner if condition worsens or does not begin to improve over the next few days.   NECK PAIN RED FLAGS: If symptoms get worse than they are right now, you should come back sooner for re-evaluation. If you have increased numbness/ tingling or notice that the numbness/tingling is affecting the legs or saddle region, go to ER. If you ever lose continence go to ER.      HEADACHE: You were seen in clinic today for headache. Rest and take meds as directed. If at any point, the headache becomes very severe, is associated with fever, is associated with neck pain/stiffness, you feel like passing out, the headache is different from any you've have had before, there are vision changes/issues with speech/issues with balance, or numbness/weakness  in a part of the body, you should be seen urgently or emergently for more serious causes of headache   Follow-up with your PCP.  Inquire about a referral to neurology if symptoms or not improving.   ED Prescriptions     Medication Sig Dispense Auth. Provider   predniSONE (DELTASONE) 10 MG tablet Take 6 tabs p.o. on day 1 and decrease by 1 tablet daily until complete 21 tablet Eusebio Friendly B, PA-C   promethazine (PHENERGAN) 25 MG tablet Take half to 1 tablet every 6 hours as needed for dizziness, nausea 30 tablet Eusebio Friendly B, PA-C   tiZANidine (ZANAFLEX) 4 MG tablet Take 1 tablet (4 mg total) by mouth at bedtime as needed for up to 15 days for muscle spasms. 15 tablet Gareth Morgan      PDMP not reviewed this encounter.   Shirlee Latch, PA-C 08/02/22 1004

## 2022-08-02 NOTE — ED Triage Notes (Signed)
Pt was seen in the ED for a syncope episode resulting in her hitting her head. She continues to have pain at the base of her neck and the bilateral sides of her head. She reports the pain as sharp and has gotten worse since the incident.

## 2022-08-16 ENCOUNTER — Ambulatory Visit (INDEPENDENT_AMBULATORY_CARE_PROVIDER_SITE_OTHER): Payer: Medicaid Other

## 2022-08-16 ENCOUNTER — Ambulatory Visit
Admission: EM | Admit: 2022-08-16 | Discharge: 2022-08-16 | Disposition: A | Discharge status: Home / Self Care | Payer: Medicaid Other | Attending: Physician Assistant | Admitting: Physician Assistant

## 2022-08-16 DIAGNOSIS — N809 Endometriosis, unspecified: Secondary | ICD-10-CM

## 2022-08-16 DIAGNOSIS — R109 Unspecified abdominal pain: Secondary | ICD-10-CM | POA: Insufficient documentation

## 2022-08-16 DIAGNOSIS — R102 Pelvic and perineal pain unspecified side: Secondary | ICD-10-CM

## 2022-08-16 DIAGNOSIS — R112 Nausea with vomiting, unspecified: Secondary | ICD-10-CM

## 2022-08-16 DIAGNOSIS — R10A2 Flank pain, left side: Secondary | ICD-10-CM

## 2022-08-16 DIAGNOSIS — N390 Urinary tract infection, site not specified: Secondary | ICD-10-CM

## 2022-08-16 LAB — URINALYSIS, ROUTINE W REFLEX MICROSCOPIC
Glucose, UA: 100 mg/dL — AB
Hgb urine dipstick: NEGATIVE
Leukocytes,Ua: NEGATIVE
Nitrite: POSITIVE — AB
Protein, ur: 100 mg/dL — AB
Specific Gravity, Urine: 1.025 (ref 1.005–1.030)
pH: 5.5 (ref 5.0–8.0)

## 2022-08-16 LAB — URINALYSIS, MICROSCOPIC (REFLEX): RBC / HPF: NONE SEEN RBC/hpf (ref 0–5)

## 2022-08-16 MED ORDER — ONDANSETRON 4 MG PO TBDP: 4.0000 mg | ORAL_TABLET | Freq: Three times a day (TID) | ORAL | 0 refills | Status: DC | PRN

## 2022-08-16 MED ORDER — SULFAMETHOXAZOLE-TRIMETHOPRIM 800-160 MG PO TABS: 1.0000 | ORAL_TABLET | Freq: Two times a day (BID) | ORAL | 0 refills | Status: AC

## 2022-08-16 MED ORDER — KETOROLAC TROMETHAMINE 60 MG/2ML IM SOLN
60.0000 mg | Freq: Once | INTRAMUSCULAR | Status: AC
  Administered 2022-08-16: 60 mg via INTRAMUSCULAR

## 2022-08-16 MED ORDER — HYDROCODONE-ACETAMINOPHEN 7.5-325 MG PO TABS: 1.0000 | ORAL_TABLET | Freq: Four times a day (QID) | ORAL | 0 refills | Status: AC | PRN

## 2022-08-16 MED ORDER — ONDANSETRON 8 MG PO TBDP
8.0000 mg | ORAL_TABLET | Freq: Once | ORAL | Status: AC
  Administered 2022-08-16: 8 mg via ORAL

## 2022-08-16 NOTE — ED Provider Notes (Signed)
[facility]    CSN: [id number] Arrival date & time: 08/16/22  0815      History   Chief Complaint Chief Complaint  Patient presents with   Abdominal Pain    HPI Sandra Gibbs is a 26 y.o. female presenting for approximately 4-day history of pelvic pain and left flank pain.  Patient says the pain started in the pelvic area first before she denies the left flank pain.  She reports a history of endometriosis, medullary sponge kidneys, chronic flank pain and fibroids.  Also has history of pyelonephritis and renal stones.  Patient reports that had her endometriosis causes her to have flareups every 2 weeks or so.  She reports that she is supposed to have a laparoscopy in January of next year.  Patient says her pain is so significant it causes her to feel like she is going to pass out.  She says that she is taken high doses of ibuprofen and Tylenol and it has not helped.  She also reports that she had leftover tramadol from a previous ED visit and tried that without any relief.  She denies any fevers.  States temp has been up to 99.8 degrees.  She has had nausea and vomiting.  No upper abdominal pain or change in BMs reported.  Reports urinary frequency but denies painful urination or hematuria.  No other concerns.  HPI  Past Medical History:  Diagnosis Date   ADHD    Kidney stones     Patient Active Problem List   Diagnosis Date Noted   Hepatic steatosis 04/08/2022   Major depressive disorder with current active episode 04/08/2022   Diarrhea 02/16/2022   Epigastric pain 02/16/2022   Personal history of kidney stones 12/31/2021   Vomiting 10/06/2021   Dysuria 10/06/2021   Hair follicle infection 10/06/2021   Need for vaccination 01/13/2021   Takotsubo cardiomyopathy 01/01/2021   Acute systolic CHF (congestive heart failure) (HCC) 01/01/2021   Pyelonephritis 12/31/2020   Diabetes mellitus without complication (HCC)    Elevated troponin    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   Attention deficit hyperactivity disorder (ADHD), 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   Renal calculi 12/07/2019   Irritable bowel syndrome without diarrhea 12/07/2019   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   Chondral lesion 09/10/2019   Patellofemoral disorder of left knee 09/10/2019   Generalized anxiety disorder 08/13/2019   MDD (major depressive disorder), recurrent episode, moderate (HCC) 08/13/2019   OSA (obstructive sleep apnea) 07/04/2017   Chronic daily headache 05/17/2017   Obesity (BMI 35.0-39.9 without comorbidity) 05/17/2017   Hematochezia 01/21/2015   Flank pain 01/07/2015   Elevated transaminase level 01/07/2015   S/P laparoscopic cholecystectomy 01/01/2015   Gall bladder stones 12/24/2014   Recurrent biliary colic 12/24/2014    Past Surgical History:  Procedure Laterality Date   CYST REMOVAL TRUNK  2019   GALLBLADDER SURGERY  2016   KIDNEY SURGERY     LEFT HEART CATH AND CORONARY ANGIOGRAPHY N/A 01/01/2021   Procedure: LEFT HEART CATH AND CORONARY ANGIOGRAPHY;  Surgeon: Alwyn Pea, MD;  Location: [facility];  Service: Cardiovascular;  Laterality: N/A;   OVARIAN CYST SURGERY  2011   TONSILLECTOMY      OB History     Gravida  1   Para  1   Term  0   Preterm  1   AB      Living  1      SAB  IAB      Ectopic      Multiple      Live Births  1            Home Medications    Prior to Admission medications   Medication Sig Start Date End Date Taking? Authorizing Provider  HYDROcodone-acetaminophen (NORCO) 7.5-325 MG tablet Take 1 tablet by mouth every 6 (six) hours as needed for up to 3 days for moderate pain. 08/16/22 08/19/22 Yes Eusebio Friendly B, PA-C  ondansetron (ZOFRAN-ODT) 4 MG disintegrating tablet Take 1 tablet (4 mg total) by mouth every 8 (eight) hours as needed for nausea or vomiting. 08/16/22  Yes Eusebio Friendly B, PA-C  sulfamethoxazole-trimethoprim (BACTRIM  DS) 800-160 MG tablet Take 1 tablet by mouth 2 (two) times daily for 7 days. 08/16/22 08/23/22 Yes Shirlee Latch, PA-C  busPIRone (BUSPAR) 10 MG tablet Take 10 mg by mouth 2 (two) times daily.    [provider]  busPIRone (BUSPAR) 15 MG tablet SMARTSIG:1 Tablet(s) By Mouth Morning-Evening 06/07/22   [provider]  dicyclomine (BENTYL) 20 MG tablet Take 20 mg by mouth 4 (four) times daily. 07/28/22   [provider]  DULoxetine (CYMBALTA) 20 MG capsule Take by mouth. 07/19/22   [provider]  FLUoxetine (PROZAC) 10 MG capsule Take by mouth. 05/02/22   [provider]  Hyoscyamine Sulfate SL 0.125 MG SUBL Place under the tongue. 01/20/22   [provider]  ibuprofen (ADVIL) 600 MG tablet Take 1 tablet (600 mg total) by mouth every 6 (six) hours as needed. 06/16/22   Domenick Gong, MD  ipratropium (ATROVENT) 0.06 % nasal spray Place 2 sprays into both nostrils 4 (four) times daily. 06/16/22   Domenick Gong, MD  leuprolide (LUPRON DEPOT, 75-MONTH,) 11.25 MG injection Inject 11.25 mg into the muscle every 3 (three) months. 03/25/22   Horald Pollen, MD  LORazepam (ATIVAN) 0.5 MG tablet Take 0.5 mg by mouth as needed. 04/28/22   [provider]  ondansetron (ZOFRAN) 4 MG tablet Take 4 mg by mouth every 8 (eight) hours as needed for nausea or vomiting.    [provider]  promethazine (PHENERGAN) 25 MG tablet Take half to 1 tablet every 6 hours as needed for dizziness, nausea 08/02/22   Shirlee Latch, PA-C  promethazine-dextromethorphan (PROMETHAZINE-DM) 6.25-15 MG/5ML syrup Take 5 mLs by mouth 4 (four) times daily as needed for cough. 06/16/22   Domenick Gong, MD  tiZANidine (ZANAFLEX) 4 MG tablet Take 1 tablet (4 mg total) by mouth at bedtime as needed for up to 15 days for muscle spasms. 08/02/22 08/17/22  Eusebio Friendly B, PA-C  traZODone (DESYREL) 50 MG tablet Take by mouth. 06/16/22   [provider]    Family  History Family History  Problem Relation Age of Onset   Heart murmur Mother    Mitral valve prolapse Mother    Heart attack Father    Atrial fibrillation Maternal Grandfather    Stroke Paternal Grandmother    Mitral valve prolapse Paternal Grandmother     Social History Social History   Tobacco Use   Smoking status: Never   Smokeless tobacco: Never  Vaping Use   Vaping Use: Never used  Substance Use Topics   Alcohol use: No   Drug use: No     Allergies   Wound dressing adhesive, Wound dressings, Latex, Morphine, and Other   Review of Systems Review of Systems  Constitutional:  Positive for fatigue. Negative for fever.  Respiratory:  Negative for shortness of breath.   Cardiovascular:  Negative for chest pain.  Gastrointestinal:  Positive for abdominal pain, nausea and vomiting. Negative for constipation and diarrhea.  Genitourinary:  Positive for flank pain, frequency and pelvic pain. Negative for difficulty urinating, dysuria, hematuria, vaginal discharge and vaginal pain.  Neurological:  Negative for dizziness, syncope and weakness.  Psychiatric/Behavioral:  Positive for sleep disturbance (due to pain).      Physical Exam Triage Vital Signs ED Triage Vitals  Enc Vitals Group     BP      Pulse      Resp      Temp      Temp src      SpO2      Weight      Height      Head Circumference      Peak Flow      Pain Score      Pain Loc      Pain Edu?      Excl. in GC?    No data found.  Updated Vital Signs BP (!) 126/94 (BP Location: Left Arm)   Pulse 97   Temp 97.8 F (36.6 C) (Temporal)   Resp 18   LMP  (LMP Unknown) Comment: hx of endometriosis. pt takes meds that keep her in menopause per pt, no chance, pt signed preg fom  SpO2 98%      Physical Exam Vitals and nursing note reviewed.  Constitutional:      General: She is not in acute distress.    Appearance: Normal appearance. She is well-developed. She is obese. She is not ill-appearing or  toxic-appearing.  HENT:     Head: Normocephalic and atraumatic.  Eyes:     General: No scleral icterus.       Right eye: No discharge.        Left eye: No discharge.     Conjunctiva/sclera: Conjunctivae normal.  Cardiovascular:     Rate and Rhythm: Normal rate and regular rhythm.     Heart sounds: Normal heart sounds.  Pulmonary:     Effort: Pulmonary effort is normal. No respiratory distress.     Breath sounds: Normal breath sounds.  Abdominal:     General: Bowel sounds are normal.     Palpations: Abdomen is soft.     Tenderness: There is abdominal tenderness (suprapubic, LLQ, RLQ). There is right CVA tenderness and left CVA tenderness. There is no rebound.  Musculoskeletal:     Cervical back: Neck supple.  Skin:    General: Skin is dry.  Neurological:     General: No focal deficit present.     Mental Status: She is alert. Mental status is at baseline.     Motor: No weakness.     Gait: Gait normal.  Psychiatric:        Mood and Affect: Mood normal.        Behavior: Behavior normal.        Thought Content: Thought content normal.      UC Treatments / Results  Labs (all labs ordered are listed, but only abnormal results are displayed) Labs Reviewed  URINALYSIS, ROUTINE W REFLEX MICROSCOPIC - Abnormal; Notable for the following components:      Result Value   Color, Urine AMBER (*)    Glucose, UA 100 (*)    Bilirubin Urine SMALL (*)    Ketones, ur TRACE (*)    Protein, ur 100 (*)    Nitrite  POSITIVE (*)    All other components within normal limits  URINALYSIS, MICROSCOPIC (REFLEX) - Abnormal; Notable for the following components:   Bacteria, UA FEW (*)    All other components within normal limits  URINE CULTURE    EKG   Radiology CT Renal Stone Study  Result Date: 08/16/2022 CLINICAL DATA:  Flank pain, left flank pain and pelvic pain. EXAM: CT ABDOMEN AND PELVIS WITHOUT CONTRAST TECHNIQUE: Multidetector CT imaging of the abdomen and pelvis was performed  following the standard protocol without IV contrast. RADIATION DOSE REDUCTION: This exam was performed according to the departmental dose-optimization program which includes automated exposure control, adjustment of the mA and/or kV according to patient size and/or use of iterative reconstruction technique. COMPARISON:  CT examination dated May 26, 2022 FINDINGS: Lower chest: No acute abnormality. Hepatobiliary: No focal liver abnormality is seen. Status post cholecystectomy. No biliary dilatation. Pancreas: Unremarkable. No pancreatic ductal dilatation or surrounding inflammatory changes. Spleen: Normal in size without focal abnormality. Adrenals/Urinary Tract: Adrenal glands are unremarkable. Multiple small bilateral 1-2 mm nonobstructing renal calculi. 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 Bladder is unremarkable. Stomach/Bowel: Stomach is within normal limits. Appendix appears normal. No evidence of bowel wall thickening, distention, or inflammatory changes. Vascular/Lymphatic: No significant vascular findings are present. No enlarged abdominal or pelvic lymph nodes. Reproductive: Uterus and bilateral adnexa are unremarkable. Other: No abdominal wall hernia or abnormality. No abdominopelvic ascites. Musculoskeletal: No acute or significant osseous findings. IMPRESSION: 1. Multiple small bilateral 1-2 mm nonobstructing renal calculi. No evidence of nephrolithiasis or hydronephrosis or ureteral calculus. 2. 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 3. No evidence of appendicitis, diverticulitis, colitis or bowel obstruction. 4. Status post cholecystectomy. Electronically Signed   By: Keane Police D.O.   On: 08/16/2022 11:33    Procedures Procedures (including critical care time)  Medications Ordered in UC Medications  ondansetron (ZOFRAN-ODT) disintegrating tablet 8 mg (8 mg Oral Given 08/16/22 0926)  ketorolac (TORADOL) injection 60 mg  (60 mg Intramuscular Given 08/16/22 1113)    Initial Impression / Assessment and Plan / UC Course  I have reviewed the triage vital signs and the nursing notes.  Pertinent labs & imaging results that were available during my care of the patient were reviewed by me and considered in my medical decision making (see chart for details).   26 year old female with history of endometriosis, nephrolithiasis, pyelonephritis, fibroids presents for left flank pain and pelvic pain for the past 4 days.  Reports that she went to the emergency department yesterday but they just treated her for a headache and sent her home.  Reviewed patient's labs from yesterday when she went to the emergency department.  Patient had CBC, CMP, magnesium level, lipase, urinalysis, hCG blood test performed.  Also had renal ultrasound and was referred to neurology for chronic headaches and postconcussive headaches.  Ultrasound did not show any hydronephrosis or obvious renal stones.  Lab work was unremarkable.  Vitals are stable.  She is overall well-appearing and nontoxic.  On exam abdomen is soft and she has tenderness to palpation in the suprapubic region, left lower quadrant, right lower quadrant and bilateral flank pain which is worse on the left side.  Chest clear auscultation heart regular rate and rhythm.  Urinalysis ordered today shows Ordered CT renal stone protocol to look for possible renal stone.   Patient given 8 mg ODT Zofran in clinic for nausea.  She declines a Toradol injection and did have ibuprofen  800 mg about 6 hours ago.  Urinalysis shows amber-colored urine with glucose, small bilirubin, trace ketones, protein and positive nitrites.  Bacteria also seen on microscopic urinalysis.  We will send urine for culture and treat for suspected urinary tract infection with Bactrim DS.  After couple of hours patient does agree to ketorolac injection so she is given 60 mg IM Toradol in clinic for acute pain relief.  She  does appear to be in significant discomfort and is often standing and bent over the exam table when I go to check on her.  CT does not show any acute abnormalities.  Discussed the result with patient.  Suspect her pain is likely related to endometriosis.  She also feels it is consistent with Endo flareup.  I have sent hydrocodone to pharmacy for acute pain relief after reviewing controlled substance database.  Advised her to follow-up closely with gynecology.  Also sent Zofran for the nausea and vomiting.  Encourage plenty of rest and fluids and advised her to go to the emergency department if she develops a fever or any worsening pain.   Final Clinical Impressions(s) / UC Diagnoses   Final diagnoses:  Pelvic pain in female  Left flank pain  Urinary tract infection without hematuria, site unspecified  Endometriosis  Nausea and vomiting, unspecified vomiting type     Discharge Instructions      -Your CT does not show any acute abnormalities. - The urinalysis might be consistent with UTI so I have sent antibiotics to pharmacy.  Increase your fluid intake.  We will call you once we get the culture and we may change her antibiotic to have you discontinue it depending on the results. - Your pain is largely likely related to your endometriosis so you should definitely follow-up closely with gynecology.  I sent sling for pain for you. - If you develop a fever or your pain worsens you should go back to the emergency department.     ED Prescriptions     Medication Sig Dispense Auth. Provider   sulfamethoxazole-trimethoprim (BACTRIM DS) 800-160 MG tablet Take 1 tablet by mouth 2 (two) times daily for 7 days. 14 tablet Eusebio FriendlyEaves, Icarus Partch B, PA-C   ondansetron (ZOFRAN-ODT) 4 MG disintegrating tablet Take 1 tablet (4 mg total) by mouth every 8 (eight) hours as needed for nausea or vomiting. 15 tablet Shirlee LatchEaves, Izacc Demeyer B, PA-C   HYDROcodone-acetaminophen (NORCO) 7.5-325 MG tablet Take 1 tablet by mouth  every 6 (six) hours as needed for up to 3 days for moderate pain. 12 tablet Shirlee LatchEaves, Rhilee Currin B, PA-C      I have reviewed the PDMP during this encounter.   Shirlee Latchaves, Sharnae Winfree B, PA-C 08/16/22 1152

## 2022-08-16 NOTE — ED Notes (Signed)
Prior Josem Kaufmann #[id number]

## 2022-08-16 NOTE — Discharge Instructions (Signed)
-  Your CT does not show any acute abnormalities. - The urinalysis might be consistent with UTI so I have sent antibiotics to pharmacy.  Increase your fluid intake.  We will call you once we get the culture and we may change her antibiotic to have you discontinue it depending on the results. - Your pain is largely likely related to your endometriosis so you should definitely follow-up closely with gynecology.  I sent sling for pain for you. - If you develop a fever or your pain worsens you should go back to the emergency department.

## 2022-08-16 NOTE — ED Triage Notes (Signed)
Patient presents to [facility] for abdominal pain x 4 days ago. Pt states she was seen in the ED yesterday treated with meds. States no improvement. [place] and labs performed no abnormal findings.

## 2022-08-17 LAB — URINE CULTURE: Culture: NO GROWTH

## 2022-11-01 ENCOUNTER — Ambulatory Visit: Admit: 2022-11-01 | Payer: Medicaid Other

## 2022-12-15 ENCOUNTER — Other Ambulatory Visit: Payer: Self-pay

## 2022-12-15 ENCOUNTER — Ambulatory Visit
Admission: EM | Admit: 2022-12-15 | Discharge: 2022-12-15 | Disposition: A | Discharge status: Home / Self Care | Payer: Medicaid Other | Attending: Physician Assistant | Admitting: Physician Assistant

## 2022-12-15 DIAGNOSIS — K141 Geographic tongue: Secondary | ICD-10-CM

## 2022-12-15 DIAGNOSIS — J029 Acute pharyngitis, unspecified: Secondary | ICD-10-CM | POA: Insufficient documentation

## 2022-12-15 DIAGNOSIS — K1379 Other lesions of oral mucosa: Secondary | ICD-10-CM | POA: Diagnosis not present

## 2022-12-15 HISTORY — DX: Endometriosis, unspecified: N80.9

## 2022-12-15 LAB — GROUP A STREP BY PCR: Group A Strep by PCR: NOT DETECTED

## 2022-12-15 MED ORDER — LIDOCAINE VISCOUS HCL 2 % MT SOLN: 15.0000 mL | OROMUCOSAL | 0 refills | Status: DC | PRN

## 2022-12-15 MED ORDER — PREDNISONE 20 MG PO TABS: 40.0000 mg | ORAL_TABLET | Freq: Every day | ORAL | 0 refills | Status: AC

## 2022-12-15 NOTE — Discharge Instructions (Signed)
-  Negative strep. - I have sent a couple days of a steroid to help with the uvular swelling and viscous lidocaine to numb your throat.  You can also use Chloraseptic spray and Tylenol.  Plenty of rest and fluids but this should be feeling better in a few days.

## 2022-12-15 NOTE — ED Provider Notes (Signed)
[facility]    CSN: [id number] Arrival date & time: 12/15/22  0801      History   Chief Complaint Chief Complaint  Patient presents with   Sore Throat    HPI Sandra Gibbs is a 27 y.o. female presenting for sore throat, painful swallowing, pain of tongue and whitish spots on tongue that began yesterday.  Patient reports that Sandra Gibbs was diagnosed with COVID 1 week ago and has had cough and congestion as well.  Reports that Sandra Gibbs was having fevers until couple of days ago.  Sandra Gibbs is concerned at this point about having thrush.  Sandra Gibbs has never had this before.  Sandra Gibbs denies any immunocompromising conditions and does not take corticosteroids or use any corticosteroid inhalers.  Denies any contact with thrush or strep to her knowledge.  Has been taking OTC meds for symptoms. History of geographic tongue. No other complaints.  HPI  Past Medical History:  Diagnosis Date   ADHD    Endometriosis    Kidney stones     Patient Active Problem List   Diagnosis Date Noted   Hepatic steatosis 04/08/2022   Major depressive disorder with current active episode 04/08/2022   Diarrhea 02/16/2022   Epigastric pain 02/16/2022   Personal history of kidney stones 12/31/2021   Vomiting 10/06/2021   Dysuria [id number]   Hair follicle infection [id number]   Need for vaccination 01/13/2021   Takotsubo cardiomyopathy [id number]   Acute systolic CHF (congestive heart failure) ([place]) 01/01/2021   Pyelonephritis 12/31/2020   Diabetes mellitus without complication (HCC)    Elevated troponin    Laxity of left anterior cruciate ligament 07/21/2020   Attention deficit hyperactivity disorder (ADHD), 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   Renal calculi 12/07/2019   Irritable bowel syndrome without diarrhea 12/07/2019   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   Chondral lesion 09/10/2019   Patellofemoral disorder of left knee 09/10/2019   Generalized anxiety disorder 08/13/2019   MDD  (major depressive disorder), recurrent episode, moderate ([place]) 08/13/2019   OSA (obstructive sleep apnea) 07/04/2017   Chronic daily headache 05/17/2017   Obesity (BMI 35.0-39.9 without comorbidity) 05/17/2017   Hematochezia 01/21/2015   Flank pain 01/07/2015   Elevated transaminase level 01/07/2015   S/P laparoscopic cholecystectomy 01/01/2015   Gall bladder stones 12/24/2014   Recurrent biliary colic [id number]    Past Surgical History:  Procedure Laterality Date   CYST REMOVAL TRUNK  2019   GALLBLADDER SURGERY  2016   KIDNEY SURGERY     LEFT HEART CATH AND CORONARY ANGIOGRAPHY N/A 01/01/2021   Procedure: LEFT HEART CATH AND CORONARY ANGIOGRAPHY;  Surgeon: Yolonda Kida, MD;  Location: [place] [facility];  Service: Cardiovascular;  Laterality: N/A;   OVARIAN CYST SURGERY  2011   TONSILLECTOMY      OB History     Gravida  1   Para  1   Term  0   Preterm  1   AB      Living  1      SAB      IAB      Ectopic      Multiple      Live Births  1            Home Medications    Prior to Admission medications   Medication Sig Start Date End Date Taking? Authorizing Provider  lidocaine (XYLOCAINE) 2 % solution Use as directed 15 mLs in the mouth or throat every 3 (three) hours  as needed for mouth pain (swish and spit). 12/15/22  Yes Danton Clap, PA-C  predniSONE (DELTASONE) 20 MG tablet Take 2 tablets (40 mg total) by mouth daily for 3 days. 12/15/22 12/18/22 Yes Danton Clap, PA-C  busPIRone (BUSPAR) 10 MG tablet Take 10 mg by mouth 2 (two) times daily.    [provider]  busPIRone (BUSPAR) 15 MG tablet SMARTSIG:1 Tablet(s) By Mouth Morning-Evening 06/07/22   [provider]  dicyclomine (BENTYL) 20 MG tablet Take 20 mg by mouth 4 (four) times daily. 07/28/22   [provider]  DULoxetine (CYMBALTA) 20 MG capsule Take by mouth. 07/19/22   [provider]  FLUoxetine (PROZAC) 10 MG capsule Take by mouth. 05/02/22    [provider]  Hyoscyamine Sulfate SL 0.125 MG SUBL Place under the tongue. 01/20/22   [provider]  ibuprofen (ADVIL) 600 MG tablet Take 1 tablet (600 mg total) by mouth every 6 (six) hours as needed. 06/16/22   Melynda Ripple, MD  ipratropium (ATROVENT) 0.06 % nasal spray Place 2 sprays into both nostrils 4 (four) times daily. 06/16/22   Melynda Ripple, MD  leuprolide (LUPRON DEPOT, 39-MONTH,) 11.25 MG injection Inject 11.25 mg into the muscle every 3 (three) months. 03/25/22   April Manson, MD  LORazepam (ATIVAN) 0.5 MG tablet Take 0.5 mg by mouth as needed. 04/28/22   [provider]  ondansetron (ZOFRAN) 4 MG tablet Take 4 mg by mouth every 8 (eight) hours as needed for nausea or vomiting.    [provider]  ondansetron (ZOFRAN-ODT) 4 MG disintegrating tablet Take 1 tablet (4 mg total) by mouth every 8 (eight) hours as needed for nausea or vomiting. 08/16/22   Danton Clap, PA-C  promethazine (PHENERGAN) 25 MG tablet Take half to 1 tablet every 6 hours as needed for dizziness, nausea 08/02/22   Danton Clap, PA-C  promethazine-dextromethorphan (PROMETHAZINE-DM) 6.25-15 MG/5ML syrup Take 5 mLs by mouth 4 (four) times daily as needed for cough. 06/16/22   Melynda Ripple, MD  traZODone (DESYREL) 50 MG tablet Take by mouth. 06/16/22   [provider]    Family History Family History  Problem Relation Age of Onset   Heart murmur Mother    Mitral valve prolapse Mother    Heart attack Father    Atrial fibrillation Maternal Grandfather    Stroke Paternal Grandmother    Mitral valve prolapse Paternal Grandmother     Social History Social History   Tobacco Use   Smoking status: Never   Smokeless tobacco: Never  Vaping Use   Vaping Use: Never used  Substance Use Topics   Alcohol use: No   Drug use: No     Allergies   Wound dressing adhesive, Wound dressings, Latex, Morphine, and Other   Review of Systems Review of Systems   Constitutional:  Positive for fatigue. Negative for chills, diaphoresis and fever.  HENT:  Positive for congestion, rhinorrhea and sore throat. Negative for ear pain, mouth sores, sinus pressure and sinus pain.   Respiratory:  Positive for cough. Negative for shortness of breath.   Gastrointestinal:  Negative for abdominal pain, nausea and vomiting.  Musculoskeletal:  Negative for arthralgias and myalgias.  Skin:  Negative for rash.  Neurological:  Negative for weakness and headaches.  Hematological:  Negative for adenopathy.     Physical Exam Triage Vital Signs ED Triage Vitals  Enc Vitals Group     BP      Pulse  Resp      Temp      Temp src      SpO2      Weight      Height      Head Circumference      Peak Flow      Pain Score      Pain Loc      Pain Edu?      Excl. in [place]?    No data found.  Updated Vital Signs BP 124/87   Pulse 90   Temp 98.2 F (36.8 C) (Oral)   Resp 20   SpO2 95%     Physical Exam Vitals and nursing note reviewed.  Constitutional:      General: Sandra Gibbs is not in acute distress.    Appearance: Normal appearance. Sandra Gibbs is not ill-appearing or toxic-appearing.  HENT:     Head: Normocephalic and atraumatic.     Nose: Congestion present.     Mouth/Throat:     Mouth: Mucous membranes are moist.     Pharynx: Oropharynx is clear. Uvula swelling (erythema and swelling of uvula) present.     Comments: Geographic tongue appearance. No exudates noted. Eyes:     General: No scleral icterus.       Right eye: No discharge.        Left eye: No discharge.     Conjunctiva/sclera: Conjunctivae normal.  Cardiovascular:     Rate and Rhythm: Normal rate and regular rhythm.     Heart sounds: Normal heart sounds.  Pulmonary:     Effort: Pulmonary effort is normal. No respiratory distress.     Breath sounds: Normal breath sounds.  Musculoskeletal:     Cervical back: Neck supple.  Skin:    General: Skin is dry.  Neurological:     General: No focal  deficit present.     Mental Status: Sandra Gibbs is alert. Mental status is at baseline.     Motor: No weakness.     Gait: Gait normal.  Psychiatric:        Mood and Affect: Mood normal.        Behavior: Behavior normal.        Thought Content: Thought content normal.      UC Treatments / Results  Labs (all labs ordered are listed, but only abnormal results are displayed) Labs Reviewed  GROUP A STREP BY PCR    EKG   Radiology No results found.  Procedures Procedures (including critical care time)  Medications Ordered in UC Medications - No data to display  Initial Impression / Assessment and Plan / UC Course  I have reviewed the triage vital signs and the nursing notes.  Pertinent labs & imaging results that were available during my care of the patient were reviewed by me and considered in my medical decision making (see chart for details).   27 year old female presents for sore throat, tongue pain and whitish spots on tongue.  Has history of geographic tongue.  Also reports COVID diagnosis 1 week ago and ongoing cough and congestion.  It is normal and stable and Sandra Gibbs is overall well-appearing.  On exam Sandra Gibbs has geographic tongue appearance without any exudates as well as erythema and swelling that is mild to moderate of the uvula.  Nasal congestion present.  Chest clear to auscultation.  PCR strep testing obtained.  Negative.  Discussed with patient.  Viral pharyngitis and uvular swelling, likely related to COVID-19.  Sent 3 days of prednisone to help with the  swelling and viscous lidocaine.  Supportive care.  Reviewed return precautions.   Final Clinical Impressions(s) / UC Diagnoses   Final diagnoses:  Viral pharyngitis  Geographic tongue  Uvular swelling     Discharge Instructions      -Negative strep. - I have sent a couple days of a steroid to help with the uvular swelling and viscous lidocaine to numb your throat.  You can also use Chloraseptic spray and Tylenol.   Plenty of rest and fluids but this should be feeling better in a few days.     ED Prescriptions     Medication Sig Dispense Auth. Provider   predniSONE (DELTASONE) 20 MG tablet Take 2 tablets (40 mg total) by mouth daily for 3 days. 6 tablet Laurene Footman B, PA-C   lidocaine (XYLOCAINE) 2 % solution Use as directed 15 mLs in the mouth or throat every 3 (three) hours as needed for mouth pain (swish and spit). 100 mL Danton Clap, PA-C      PDMP not reviewed this encounter.   Danton Clap, PA-C 12/15/22 [phone]

## 2022-12-15 NOTE — ED Triage Notes (Signed)
Pt c/o white coating on tongue since yesterday. Also c/o sore throat and pain when swallowing, fever  that subsided 2 days ago.

## 2022-12-28 ENCOUNTER — Ambulatory Visit (INDEPENDENT_AMBULATORY_CARE_PROVIDER_SITE_OTHER): Payer: Medicaid Other

## 2022-12-28 ENCOUNTER — Ambulatory Visit
Admission: EM | Admit: 2022-12-28 | Discharge: 2022-12-28 | Disposition: A | Discharge status: Home / Self Care | Payer: Medicaid Other | Attending: Family Medicine | Admitting: Family Medicine

## 2022-12-28 ENCOUNTER — Encounter: Payer: Self-pay | Admitting: Emergency Medicine

## 2022-12-28 DIAGNOSIS — N200 Calculus of kidney: Secondary | ICD-10-CM

## 2022-12-28 DIAGNOSIS — R109 Unspecified abdominal pain: Secondary | ICD-10-CM

## 2022-12-28 DIAGNOSIS — R000 Tachycardia, unspecified: Secondary | ICD-10-CM | POA: Diagnosis not present

## 2022-12-28 DIAGNOSIS — R102 Pelvic and perineal pain: Secondary | ICD-10-CM

## 2022-12-28 LAB — URINALYSIS, W/ REFLEX TO CULTURE (INFECTION SUSPECTED)
Bilirubin Urine: NEGATIVE
Glucose, UA: NEGATIVE mg/dL
Hgb urine dipstick: NEGATIVE
Ketones, ur: NEGATIVE mg/dL
Nitrite: NEGATIVE
Specific Gravity, Urine: >1.03 — ABNORMAL HIGH (ref 1.005–1.030)
pH: 6 (ref 5.0–8.0)

## 2022-12-28 LAB — CBC WITH DIFFERENTIAL/PLATELET
Abs Immature Granulocytes: 0.03 10*3/uL (ref 0.00–0.07)
Basophils Absolute: 0.1 10*3/uL (ref 0.0–0.1)
Basophils Relative: 1 %
Eosinophils Absolute: 0.1 10*3/uL (ref 0.0–0.5)
Eosinophils Relative: 2 %
HCT: 41.9 % (ref 36.0–46.0)
Hemoglobin: 14.8 g/dL (ref 12.0–15.0)
Immature Granulocytes: 0 %
Lymphocytes Relative: 45 %
Lymphs Abs: 3.1 10*3/uL (ref 0.7–4.0)
MCH: 29.6 pg (ref 26.0–34.0)
MCHC: 35.3 g/dL (ref 30.0–36.0)
MCV: 83.8 fL (ref 80.0–100.0)
Monocytes Absolute: 0.6 10*3/uL (ref 0.1–1.0)
Monocytes Relative: 9 %
Neutro Abs: 2.9 10*3/uL (ref 1.7–7.7)
Neutrophils Relative %: 43 %
Platelets: 330 10*3/uL (ref 150–400)
RBC: 5 MIL/uL (ref 3.87–5.11)
RDW: 12 % (ref 11.5–15.5)
WBC: 6.7 10*3/uL (ref 4.0–10.5)
nRBC: 0 % (ref 0.0–0.2)

## 2022-12-28 LAB — BASIC METABOLIC PANEL
Anion gap: 11 (ref 5–15)
BUN: 9 mg/dL (ref 6–20)
CO2: 22 mmol/L (ref 22–32)
Calcium: 9.2 mg/dL (ref 8.9–10.3)
Chloride: 102 mmol/L (ref 98–111)
Creatinine, Ser: 0.73 mg/dL (ref 0.44–1.00)
GFR, Estimated: >60 mL/min (ref >60)
Glucose, Bld: 104 mg/dL — ABNORMAL HIGH (ref 70–99)
Potassium: 4.3 mmol/L (ref 3.5–5.1)
Sodium: 135 mmol/L (ref 135–145)

## 2022-12-28 LAB — PREGNANCY, URINE: Preg Test, Ur: NEGATIVE

## 2022-12-28 MED ORDER — KETOROLAC TROMETHAMINE 30 MG/ML IJ SOLN
30.0000 mg | Freq: Once | INTRAMUSCULAR | Status: AC
  Administered 2022-12-28: 30 mg via INTRAMUSCULAR

## 2022-12-28 MED ORDER — SODIUM CHLORIDE 0.9 % IV BOLUS: 1000.0000 mL | Freq: Once | INTRAVENOUS | Status: DC

## 2022-12-28 MED ORDER — HYDROCODONE-ACETAMINOPHEN 5-325 MG PO TABS: 1.0000 | ORAL_TABLET | Freq: Four times a day (QID) | ORAL | 0 refills | Status: DC | PRN

## 2022-12-28 NOTE — Discharge Instructions (Addendum)
You urine has some blood but no evidence of a urinary tract infection.  Your CT scan showed kidney stones up to 2.5 mm in the lower left kidney and up to 4 mm in the lower right kidney. None of which where obstructing the flow of urine. Your uterus fallopian tubes and ovarians did not show any concerning findings.   Evidence of an infection in your bloodstream and you are not anemic.  Your kidney function is normal.  Your electrical tracing of your heart/EKG was unchanged from September 2023.  Follow-up with your gynecologist and urologist for further management.

## 2022-12-28 NOTE — ED Triage Notes (Signed)
Pt c/o pelvic and flank pain x 3 days. Pt has tried OTC pain medication and flomax for relief.

## 2022-12-28 NOTE — ED Provider Notes (Signed)
[facility]    CSN: [id number] Arrival date & time: 12/28/22  0808      History   Chief Complaint Chief Complaint  Patient presents with   Pelvic Pain    fl   Flank Pain     HPI HPI Sandra Gibbs is a 27 y.o. female.    Sandra Gibbs presents for pressure like pelvic pain that starrted last night. Has history of medullary sponge kidneys and endometriosis.   Tried OTC pain medication and Flomax prior to arrival that doesn't help.  Took ketoralac twice yesterday but didn't help.  No blood in urine. Has nausea but no vomiting.     Unsure when her last period was. 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 Urology "doesn't do anything."  They have had to blast some large stones before but states she would need to let the other ones pass on their own.        Past Medical History:  Diagnosis Date   ADHD    Endometriosis    Kidney stones     Patient Active Problem List   Diagnosis Date Noted   Hepatic steatosis 04/08/2022   Major depressive disorder with current active episode 04/08/2022   Diarrhea 02/16/2022   Epigastric pain 02/16/2022   Personal history of kidney stones 12/31/2021   Vomiting 10/06/2021   Dysuria [id number]   Hair follicle infection [id number]   Need for vaccination 01/13/2021   Takotsubo cardiomyopathy [id number]   Acute systolic CHF (congestive heart failure) ([place]) 01/01/2021   Pyelonephritis 12/31/2020   Diabetes mellitus without complication (HCC)    Elevated troponin    Laxity of left anterior cruciate ligament 07/21/2020   Attention deficit hyperactivity disorder (ADHD), 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   Renal calculi 12/07/2019   Irritable bowel syndrome without diarrhea 12/07/2019   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   Chondral lesion 09/10/2019   Patellofemoral disorder of left knee 09/10/2019   Generalized anxiety disorder 08/13/2019   MDD  (major depressive disorder), recurrent episode, moderate ([place]) 08/13/2019   OSA (obstructive sleep apnea) 07/04/2017   Chronic daily headache 05/17/2017   Obesity (BMI 35.0-39.9 without comorbidity) 05/17/2017   Hematochezia 01/21/2015   Flank pain 01/07/2015   Elevated transaminase level 01/07/2015   S/P laparoscopic cholecystectomy 01/01/2015   Gall bladder stones 12/24/2014   Recurrent biliary colic 123456    Past Surgical History:  Procedure Laterality Date   CYST REMOVAL TRUNK  2019   GALLBLADDER SURGERY  2016   KIDNEY SURGERY     LEFT HEART CATH AND CORONARY ANGIOGRAPHY N/A 01/01/2021   Procedure: LEFT HEART CATH AND CORONARY ANGIOGRAPHY;  Surgeon: Yolonda Kida, MD;  Location: [place] [facility];  Service: Cardiovascular;  Laterality: N/A;   OVARIAN CYST SURGERY  2011   TONSILLECTOMY      OB History     Gravida  1   Para  1   Term  0   Preterm  1   AB      Living  1      SAB      IAB      Ectopic      Multiple      Live Births  1            Home Medications    Prior to Admission medications   Medication Sig Start Date End Date Taking? Authorizing Provider  HYDROcodone-acetaminophen (NORCO/VICODIN) 5-325 MG tablet Take  1 tablet by mouth every 6 (six) hours as needed. 12/28/22  Yes Alisi Lupien, DO  ketorolac (TORADOL) 10 MG tablet Take 10 mg by mouth every 6 (six) hours as needed. 10/11/22  Yes [provider]  letrozole (FEMARA) 2.5 MG tablet Take by mouth. 07/13/22 07/13/23 Yes [provider]  tamsulosin (FLOMAX) 0.4 MG CAPS capsule Take by mouth. 12/01/22 12/01/23 Yes [provider]  busPIRone (BUSPAR) 10 MG tablet Take 10 mg by mouth 2 (two) times daily.    [provider]  busPIRone (BUSPAR) 15 MG tablet SMARTSIG:1 Tablet(s) By Mouth Morning-Evening 06/07/22   [provider]  dicyclomine (BENTYL) 20 MG tablet Take 20 mg by mouth 4 (four) times daily. 07/28/22   [provider]   DULoxetine (CYMBALTA) 20 MG capsule Take by mouth. 07/19/22   [provider]  FLUoxetine (PROZAC) 10 MG capsule Take by mouth. 05/02/22   [provider]  gabapentin (NEURONTIN) 300 MG capsule Take 300 mg by mouth 3 (three) times daily.    [provider]  Hyoscyamine Sulfate SL 0.125 MG SUBL Place under the tongue. 01/20/22   [provider]  ibuprofen (ADVIL) 600 MG tablet Take 1 tablet (600 mg total) by mouth every 6 (six) hours as needed. 06/16/22   Melynda Ripple, MD  ipratropium (ATROVENT) 0.06 % nasal spray Place 2 sprays into both nostrils 4 (four) times daily. 06/16/22   Melynda Ripple, MD  leuprolide (LUPRON DEPOT, 40-MONTH,) 11.25 MG injection Inject 11.25 mg into the muscle every 3 (three) months. 03/25/22   April Manson, MD  lidocaine (XYLOCAINE) 2 % solution Use as directed 15 mLs in the mouth or throat every 3 (three) hours as needed for mouth pain (swish and spit). 12/15/22   Laurene Footman B, PA-C  LORazepam (ATIVAN) 0.5 MG tablet Take 0.5 mg by mouth as needed. 04/28/22   [provider]  ondansetron (ZOFRAN) 4 MG tablet Take 4 mg by mouth every 8 (eight) hours as needed for nausea or vomiting.    [provider]  ondansetron (ZOFRAN-ODT) 4 MG disintegrating tablet Take 1 tablet (4 mg total) by mouth every 8 (eight) hours as needed for nausea or vomiting. 08/16/22   Laurene Footman B, PA-C  ORILISSA 200 MG TABS Take 1 tablet by mouth 2 (two) times daily.    [provider]  promethazine (PHENERGAN) 25 MG tablet Take half to 1 tablet every 6 hours as needed for dizziness, nausea 08/02/22   Danton Clap, PA-C  promethazine-dextromethorphan (PROMETHAZINE-DM) 6.25-15 MG/5ML syrup Take 5 mLs by mouth 4 (four) times daily as needed for cough. 06/16/22   Melynda Ripple, MD  traZODone (DESYREL) 50 MG tablet Take by mouth. 06/16/22   [provider]    Family History Family History  Problem Relation Age of Onset    Heart murmur Mother    Mitral valve prolapse Mother    Heart attack Father    Atrial fibrillation Maternal Grandfather    Stroke Paternal Grandmother    Mitral valve prolapse Paternal Grandmother     Social History Social History   Tobacco Use   Smoking status: Never   Smokeless tobacco: Never  Vaping Use   Vaping Use: Never used  Substance Use Topics   Alcohol use: No   Drug use: No     Allergies   Iodinated contrast media, Wound dressing adhesive, Wound dressings, Latex, Morphine, and Other   Review of Systems Review of Systems: :negative unless otherwise stated in  HPI.      Physical Exam Triage Vital Signs ED Triage Vitals  Enc Vitals Group     BP 12/28/22 0857 136/85     Pulse Rate 12/28/22 0857 (!) 106     Resp 12/28/22 0857 16     Temp 12/28/22 0857 98 F (36.7 C)     Temp Source 12/28/22 0857 Oral     SpO2 12/28/22 0857 98 %     Weight --      Height --      Head Circumference --      Peak Flow --      Pain Score 12/28/22 0854 6     Pain Loc --      Pain Edu? --      Excl. in [place]? --    No data found.  Updated Vital Signs BP 136/85 (BP Location: Left Arm)   Pulse (!) 106   Temp 98 F (36.7 C) (Oral)   Resp 16   SpO2 98%   Visual Acuity Right Eye Distance:   Left Eye Distance:   Bilateral Distance:    Right Eye Near:   Left Eye Near:    Bilateral Near:     Physical Exam GEN: well appearing female in no acute distress  CVS: well perfused, tachycardic, regular rhythm  RESP: speaking in full sentences without pause, clear to auscultation but clear to auscultation bilaterally ABD: soft, non-tender, non-distended, no palpable masses, + left CVA tenderness   SKIN: warm, dry    UC Treatments / Results  Labs (all labs ordered are listed, but only abnormal results are displayed) Labs Reviewed  URINALYSIS, W/ REFLEX TO CULTURE (INFECTION SUSPECTED) - Abnormal; Notable for the following components:      Result Value   Specific Gravity,  Urine >1.030 (*)    Protein, ur TRACE (*)    Leukocytes,Ua SMALL (*)    Non Squamous Epithelial PRESENT (*)    Bacteria, UA MANY (*)    All other components within normal limits  BASIC METABOLIC PANEL - Abnormal; Notable for the following components:   Glucose, Bld 104 (*)    All other components within normal limits  PREGNANCY, URINE  CBC WITH DIFFERENTIAL/PLATELET    EKG   Radiology CT Renal Stone Study  Result Date: 12/28/2022 CLINICAL DATA:  Abdominal/flank pain, stone suspected. EXAM: CT ABDOMEN AND PELVIS WITHOUT CONTRAST TECHNIQUE: Multidetector CT imaging of the abdomen and pelvis was performed following the standard protocol without IV contrast. RADIATION DOSE REDUCTION: This exam was performed according to the departmental dose-optimization program which includes automated exposure control, adjustment of the mA and/or kV according to patient size and/or use of iterative reconstruction technique. COMPARISON:  CT abdomen/pelvis 08/16/2022. FINDINGS: Lower chest: No acute abnormality. Hepatobiliary: Hepatic steatosis. Prior cholecystectomy. No biliary dilatation. Pancreas: Unremarkable. No pancreatic ductal dilatation or surrounding inflammatory changes. Spleen: Normal in size without focal abnormality. Adrenals/Urinary Tract: Unremarkable adrenal glands. 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 No hydronephrosis. Bladder is unremarkable for degree of distention. Stomach/Bowel: Normal stomach and duodenum. No dilated loops of small bowel. Normal appendix is visualized on image 58 series 2. No bowel wall thickening or surrounding inflammation. Vascular/Lymphatic: No significant vascular findings are present. No enlarged abdominal or pelvic lymph nodes. Reproductive: Uterus and bilateral adnexa are unremarkable. Other: No  abdominal  wall hernia or abnormality. No abdominopelvic ascites. Musculoskeletal: No acute or significant osseous findings. IMPRESSION: 1. Medullary nephrocalcinosis with nonobstructing renal calculi, measuring up to 2.5 mm in the lower Gibbs left kidney and 4 mm in the lower Gibbs right kidney. No hydronephrosis. 2. Hepatic steatosis. Electronically Signed   By: Emmit Alexanders M.D.   On: 12/28/2022 10:38    Procedures Procedures (including critical care time)  Medications Ordered in UC Medications  ketorolac (TORADOL) 30 MG/ML injection 30 mg (30 mg Intramuscular Given 12/28/22 1114)    Initial Impression / Assessment and Plan / UC Course  I have reviewed the triage vital signs and the nursing notes.  Pertinent labs & imaging results that were available during my care of the patient were reviewed by me and considered in my medical decision making (see chart for details).      Patient is a 27 y.o. female with history of recurrent urolithiasis, medullary's sponge kidneys, pelvic congestion syndrome, endometriosis, Takotsubo cardiomyopathy, generalized anxiety disorder, diabetes mellitus type 2, OSA, hepatic steatosis who presents for pelvic pain and flank pain that started last night.  Overall patient is well-appearing and afebrile.  She is tachycardic otherwise vital signs are stable.  EKG heart rate 100, rSR' pattern concerning for RBBB, without acute ST or T wave changes, no STEMI or ischemia, unchanged compared to 06/16/22.   Urine pregnancy test is negative.  UA not consistent with acute cystitis.   Hematuria supported on microscopy.  Given history of recurrent nephrolithiasis and medullary sponge kidneys recommended a CT to look for a kidney stone.  Patient is agreeable.  CT renal study, CBC and BMP ordered.   CBC is normal and BMP is grossly unremarkable.  CT Renal showed numerous small kidney stones measuring up to 2.5 mm lower Gibbs left kidney stones with a 4 mm right lower Gibbs kidney  stones.  There was no hydronephrosis seen.  Uterus and adnexa were unremarkable.  Prescribed Norco for pain.  Patient to follow-up with her gynecologist and/or urologist regarding her chronic pelvic pain.   Return precautions including abdominal pain, fever, chills, nausea, or vomiting given. Discussed MDM, treatment plan and plan for follow-up with patient/parent who agrees with plan.         Final Clinical Impressions(s) / UC Diagnoses   Final diagnoses:  Pelvic pain in female  Flank pain  Tachycardia  Recurrent kidney stones     Discharge Instructions      You urine has some blood but no evidence of a urinary tract infection.  Your CT scan showed kidney stones up to 2.5 mm in the lower left kidney and up to 4 mm in the lower right kidney. None of which where obstructing the flow of urine. Your uterus fallopian tubes and ovarians did not show any concerning findings.   Evidence of an infection in your bloodstream and you are not anemic.  Your kidney function is normal.  Your electrical tracing of your heart/EKG was unchanged from September 2023.  Follow-up with your gynecologist and urologist for further management.      ED Prescriptions     Medication Sig Dispense Auth. Provider   HYDROcodone-acetaminophen (NORCO/VICODIN) 5-325 MG tablet Take 1 tablet by mouth every 6 (six) hours as needed. 8 tablet Almando Brawley, Ronnette Juniper, DO      I have reviewed the PDMP during this encounter.   Lyndee Hensen, DO 12/28/22 1200

## 2022-12-28 NOTE — ED Notes (Signed)
CT Renal Laren Everts authorized [id number] valid 12/28/22-02/25/23

## 2023-01-01 ENCOUNTER — Ambulatory Visit
Admission: EM | Admit: 2023-01-01 | Discharge: 2023-01-01 | Disposition: A | Discharge status: Home / Self Care | Payer: Medicaid Other | Attending: Emergency Medicine | Admitting: Emergency Medicine

## 2023-01-01 DIAGNOSIS — L439 Lichen planus, unspecified: Secondary | ICD-10-CM | POA: Insufficient documentation

## 2023-01-01 LAB — GROUP A STREP BY PCR: Group A Strep by PCR: NOT DETECTED

## 2023-01-01 MED ORDER — FLUOCINONIDE 0.05 % EX GEL: 1.0000 | Freq: Two times a day (BID) | CUTANEOUS | 0 refills | Status: DC

## 2023-01-01 NOTE — Discharge Instructions (Addendum)
Apply to Lidex gel to your tongue and cheeks twice daily to help with pain.  Avoid acidic foods as this cam make the pain worse.  You may continue to use the Magic mouthwash you were prescribed by your PCP.  If your symptoms do not improve follow up with your dentist.

## 2023-01-01 NOTE — ED Triage Notes (Signed)
Pt c/o mouth pain x2 wks. States she has white coating on & under tongue, denies any fevers. Was seen on 3/7 for the same issue & was given lido rx & is not helping her sx's anymore.

## 2023-01-01 NOTE — ED Provider Notes (Signed)
[facility]    CSN: [id number] Arrival date & time: 01/01/23  1422      History   Chief Complaint Chief Complaint  Patient presents with   Oral Pain    HPI Sandra Gibbs is a 27 y.o. female.   HPI  27 year old female with a past medical history significant for Takotsubo cardiomyopathy, CHF, and ADHD presents for evaluation of 2 weeks worth of mouth pain.  She reports that she has a white coating on it under her tongue.  She denies any fevers.  She was seen on 12/15/2022 for similar and given a prescription for viscous lidocaine which has not been helping.  She does not use any inhaled corticosteroids.  Past Medical History:  Diagnosis Date   ADHD    Endometriosis    Kidney stones     Patient Active Problem List   Diagnosis Date Noted   Hepatic steatosis 04/08/2022   Major depressive disorder with current active episode 04/08/2022   Diarrhea 02/16/2022   Epigastric pain 02/16/2022   Personal history of kidney stones 12/31/2021   Vomiting 10/06/2021   Dysuria [id number]   Hair follicle infection [id number]   Need for vaccination 01/13/2021   Takotsubo cardiomyopathy [id number]   Acute systolic CHF (congestive heart failure) ([place]) 01/01/2021   Pyelonephritis 12/31/2020   Diabetes mellitus without complication (HCC)    Elevated troponin    Laxity of left anterior cruciate ligament 07/21/2020   Attention deficit hyperactivity disorder (ADHD), 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   Renal calculi 12/07/2019   Irritable bowel syndrome without diarrhea 12/07/2019   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   Chondral lesion 09/10/2019   Patellofemoral disorder of left knee 09/10/2019   Generalized anxiety disorder 08/13/2019   MDD (major depressive disorder), recurrent episode, moderate ([place]) 08/13/2019   OSA (obstructive sleep apnea) 07/04/2017   Chronic daily headache 05/17/2017   Obesity (BMI 35.0-39.9 without comorbidity) 05/17/2017    Hematochezia 01/21/2015   Flank pain 01/07/2015   Elevated transaminase level 01/07/2015   S/P laparoscopic cholecystectomy 01/01/2015   Gall bladder stones 12/24/2014   Recurrent biliary colic [id number]    Past Surgical History:  Procedure Laterality Date   CYST REMOVAL TRUNK  2019   GALLBLADDER SURGERY  2016   KIDNEY SURGERY     LEFT HEART CATH AND CORONARY ANGIOGRAPHY N/A 01/01/2021   Procedure: LEFT HEART CATH AND CORONARY ANGIOGRAPHY;  Surgeon: Yolonda Kida, MD;  Location: [facility];  Service: Cardiovascular;  Laterality: N/A;   OVARIAN CYST SURGERY  2011   TONSILLECTOMY      OB History     Gravida  1   Para  1   Term  0   Preterm  1   AB      Living  1      SAB      IAB      Ectopic      Multiple      Live Births  1            Home Medications    Prior to Admission medications   Medication Sig Start Date End Date Taking? Authorizing Provider  busPIRone (BUSPAR) 10 MG tablet Take 10 mg by mouth 2 (two) times daily.   Yes [provider]  busPIRone (BUSPAR) 15 MG tablet SMARTSIG:1 Tablet(s) By Mouth Morning-Evening 06/07/22  Yes [provider]  dicyclomine (BENTYL) 20 MG tablet Take 20 mg by mouth 4 (four) times daily. 07/28/22  Yes [provider]  DULoxetine (CYMBALTA) 20 MG capsule Take by mouth. 07/19/22  Yes [provider]  fluocinonide gel (LIDEX) [id number] % Apply 1 Application topically 2 (two) times daily. 01/01/23  Yes Margarette Canada, NP  FLUoxetine (PROZAC) 10 MG capsule Take by mouth. 05/02/22  Yes [provider]  gabapentin (NEURONTIN) 300 MG capsule Take 300 mg by mouth 3 (three) times daily.   Yes [provider]  HYDROcodone-acetaminophen (NORCO/VICODIN) 5-325 MG tablet Take 1 tablet by mouth every 6 (six) hours as needed. 12/28/22  Yes Brimage, Vondra, DO  Hyoscyamine Sulfate SL 0.125 MG SUBL Place under the tongue. 01/20/22  Yes [provider]  ibuprofen  (ADVIL) 600 MG tablet Take 1 tablet (600 mg total) by mouth every 6 (six) hours as needed. 06/16/22  Yes Melynda Ripple, MD  ipratropium (ATROVENT) 0.06 % nasal spray Place 2 sprays into both nostrils 4 (four) times daily. 06/16/22  Yes Melynda Ripple, MD  ketorolac (TORADOL) 10 MG tablet Take 10 mg by mouth every 6 (six) hours as needed. 10/11/22  Yes [provider]  letrozole (FEMARA) 2.5 MG tablet Take by mouth. 07/13/22 07/13/23 Yes [provider]  leuprolide (LUPRON DEPOT, 73-MONTH,) 11.25 MG injection Inject 11.25 mg into the muscle every 3 (three) months. 03/25/22  Yes April Manson, MD  lidocaine (XYLOCAINE) 2 % solution Use as directed 15 mLs in the mouth or throat every 3 (three) hours as needed for mouth pain (swish and spit). 12/15/22  Yes Laurene Footman B, PA-C  LORazepam (ATIVAN) 0.5 MG tablet Take 0.5 mg by mouth as needed. 04/28/22  Yes [provider]  ondansetron (ZOFRAN) 4 MG tablet Take 4 mg by mouth every 8 (eight) hours as needed for nausea or vomiting.   Yes [provider]  ondansetron (ZOFRAN-ODT) 4 MG disintegrating tablet Take 1 tablet (4 mg total) by mouth every 8 (eight) hours as needed for nausea or vomiting. 08/16/22  Yes Laurene Footman B, PA-C  ORILISSA 200 MG TABS Take 1 tablet by mouth 2 (two) times daily.   Yes [provider]  promethazine (PHENERGAN) 25 MG tablet Take half to 1 tablet every 6 hours as needed for dizziness, nausea 08/02/22  Yes Danton Clap, PA-C  promethazine-dextromethorphan (PROMETHAZINE-DM) 6.25-15 MG/5ML syrup Take 5 mLs by mouth 4 (four) times daily as needed for cough. 06/16/22  Yes Melynda Ripple, MD  tamsulosin [facility]) 0.4 MG CAPS capsule Take by mouth. 12/01/22 12/01/23 Yes [provider]  traZODone (DESYREL) 50 MG tablet Take by mouth. 06/16/22  Yes [provider]    Family History Family History  Problem Relation Age of Onset   Heart murmur Mother    Mitral valve prolapse  Mother    Heart attack Father    Atrial fibrillation Maternal Grandfather    Stroke Paternal Grandmother    Mitral valve prolapse Paternal Grandmother     Social History Social History   Tobacco Use   Smoking status: Never   Smokeless tobacco: Never  Vaping Use   Vaping Use: Never used  Substance Use Topics   Alcohol use: No   Drug use: No     Allergies   Iodinated contrast media, Wound dressing adhesive, Wound dressings, Latex, Morphine, and Other   Review of Systems Review of Systems  Constitutional:  Negative for fever.  HENT:  Positive for mouth sores.      Physical Exam Triage Vital Signs ED Triage Vitals  Enc Vitals Group  BP 01/01/23 1429 (!) 125/102     Pulse Rate 01/01/23 1429 100     Resp 01/01/23 1429 16     Temp 01/01/23 1429 98.4 F (36.9 C)     Temp Source 01/01/23 1429 Oral     SpO2 01/01/23 1429 97 %     Weight 01/01/23 1428 230 lb (104.3 kg)     Height 01/01/23 1428 5' (1.524 m)     Head Circumference --      Peak Flow --      Pain Score 01/01/23 1428 5     Pain Loc --      Pain Edu? --      Excl. in [place]? --    No data found.  Updated Vital Signs BP (!) 125/102 (BP Location: Left Arm)   Pulse 100   Temp 98.4 F (36.9 C) (Oral)   Resp 16   Ht 5' (1.524 m)   Wt 230 lb (104.3 kg)   SpO2 97%   BMI 44.92 kg/m   Visual Acuity Right Eye Distance:   Left Eye Distance:   Bilateral Distance:    Right Eye Near:   Left Eye Near:    Bilateral Near:     Physical Exam Vitals and nursing note reviewed.  Constitutional:      Appearance: Normal appearance. She is not ill-appearing.  HENT:     Head: Normocephalic and atraumatic.     Mouth/Throat:     Mouth: Mucous membranes are moist.     Pharynx: Oropharynx is clear. Posterior oropharyngeal erythema present.     Comments: Patient has white lacy lesions underneath her tongue, on top of her tongue, and on the inside of both cheeks that has a lacy appearance.  The white cannot be  scraped off.  There is very mild erythema of the underlying tissue but no significant erythema or edema.  Posterior oropharynx is unremarkable. Skin:    General: Skin is warm and dry.  Neurological:     Mental Status: She is alert.      UC Treatments / Results  Labs (all labs ordered are listed, but only abnormal results are displayed) Labs Reviewed  GROUP A STREP BY PCR    EKG   Radiology No results found.  Procedures Procedures (including critical care time)  Medications Ordered in UC Medications - No data to display  Initial Impression / Assessment and Plan / UC Course  I have reviewed the triage vital signs and the nursing notes.  Pertinent labs & imaging results that were available during my care of the patient were reviewed by me and considered in my medical decision making (see chart for details).   Patient is a pleasant, nontoxic-appearing 27 year old female here for evaluation of white coating on her tongue and cheeks that is been present for last 2 weeks.  She was seen previously in this urgent care, as well as by her PCP, and she was advised that it is most likely inflammation of her geographic tongue.  She was prescribed prednisone by urgent care which she reports helped her symptoms.  Her PCP prescribed Magic mouthwash which she just picked up today but states it has not helped.      As you can see the images above the patient does have a geographic tongue but she also has white lacy lesions on the inside of her cheeks and under her tongue which is consistent with lichen planus.  She has no known history of autoimmune disease and  she states that she does not drink alcohol.  She has not been on any recent antibiotics.  Her chart says she has a history of diabetes but she says she does not, she was on metformin in the past for weight loss.  I will prescribe fluocinonide 0.05% topical gel that she can apply twice daily to help calm down inflammation and pain.  She may  also continue to use the Magic mouthwash to help with discomfort.  I advised her to avoid eating any acidic foods as this can make it worse.  She should also avoid alcohol.  If her symptoms do not improve I did recommend that she follow-up with her dentist as they see this frequently and may have other treatment options available.   Final Clinical Impressions(s) / UC Diagnoses   Final diagnoses:  Lichen planus     Discharge Instructions      Apply to Lidex gel to your tongue and cheeks twice daily to help with pain.  Avoid acidic foods as this cam make the pain worse.  You may continue to use the Magic mouthwash you were prescribed by your PCP.  If your symptoms do not improve follow up with your dentist.      ED Prescriptions     Medication Sig Dispense Auth. Provider   fluocinonide gel (LIDEX) [id number] % Apply 1 Application topically 2 (two) times daily. 60 g Margarette Canada, NP      PDMP not reviewed this encounter.   Margarette Canada, NP 01/01/23 1452

## 2023-03-01 ENCOUNTER — Telehealth: Payer: Self-pay

## 2023-03-01 NOTE — Telephone Encounter (Signed)
   Name: Sandra Gibbs  DOB: 08/10/1996  MRN: [id number]  Primary Cardiologist: None   Preoperative team, please contact this patient and set up a phone call appointment for further preoperative risk assessment. Please obtain consent and complete medication review. Thank you for your help.  I confirm that guidance regarding antiplatelet and oral anticoagulation therapy has been completed and, if necessary, noted below (none requested).    Joylene Grapes, NP 03/01/2023, 2:57 PM [facility]

## 2023-03-01 NOTE — Telephone Encounter (Signed)
Lvmtrc and schedule a tele appt for preop clearance.

## 2023-03-01 NOTE — Telephone Encounter (Signed)
   Pre-operative Risk Assessment    Patient Name: Sandra Gibbs  DOB: 05/06/1996 MRN: [id number]      Request for Surgical Clearance    Procedure:   Metabolic Geriatric Surgery  Date of Surgery:  Clearance TBD                                 Surgeon:  Ayesha Rumpf MSN, FNP Surgeon's Group or Practice Name:  [facility]  Phone number:  [phone] Fax number:  [phone]   Type of Clearance Requested:   - Medical    Type of Anesthesia:  Not Indicated   Additional requests/questions:    Signed, Zada Finders   03/01/2023, 2:12 PM

## 2023-03-03 ENCOUNTER — Other Ambulatory Visit: Payer: Self-pay | Admitting: Family Medicine

## 2023-03-03 DIAGNOSIS — R1032 Left lower quadrant pain: Secondary | ICD-10-CM

## 2023-03-03 DIAGNOSIS — K625 Hemorrhage of anus and rectum: Secondary | ICD-10-CM

## 2023-03-03 NOTE — Telephone Encounter (Signed)
2nd attempt to reach pt to schedule a tele pre op appt.  

## 2023-03-07 ENCOUNTER — Emergency Department: Payer: Medicaid Other

## 2023-03-07 ENCOUNTER — Telehealth: Payer: Self-pay | Admitting: Student

## 2023-03-07 ENCOUNTER — Emergency Department
Admission: EM | Admit: 2023-03-07 | Discharge: 2023-03-07 | Disposition: A | Discharge status: Home / Self Care | Payer: Medicaid Other | Attending: Emergency Medicine | Admitting: Emergency Medicine

## 2023-03-07 DIAGNOSIS — E119 Type 2 diabetes mellitus without complications: Secondary | ICD-10-CM | POA: Insufficient documentation

## 2023-03-07 DIAGNOSIS — R1032 Left lower quadrant pain: Secondary | ICD-10-CM | POA: Insufficient documentation

## 2023-03-07 DIAGNOSIS — K625 Hemorrhage of anus and rectum: Secondary | ICD-10-CM | POA: Diagnosis not present

## 2023-03-07 DIAGNOSIS — G8929 Other chronic pain: Secondary | ICD-10-CM | POA: Diagnosis not present

## 2023-03-07 DIAGNOSIS — I509 Heart failure, unspecified: Secondary | ICD-10-CM | POA: Insufficient documentation

## 2023-03-07 LAB — CBC WITH DIFFERENTIAL/PLATELET
Abs Immature Granulocytes: 0.02 10*3/uL (ref 0.00–0.07)
Basophils Absolute: 0.1 10*3/uL (ref 0.0–0.1)
Basophils Relative: 1 %
Eosinophils Absolute: 0.1 10*3/uL (ref 0.0–0.5)
Eosinophils Relative: 1 %
HCT: 41.6 % (ref 36.0–46.0)
Hemoglobin: 14.1 g/dL (ref 12.0–15.0)
Immature Granulocytes: 0 %
Lymphocytes Relative: 52 %
Lymphs Abs: 3.7 10*3/uL (ref 0.7–4.0)
MCH: 29 pg (ref 26.0–34.0)
MCHC: 33.9 g/dL (ref 30.0–36.0)
MCV: 85.4 fL (ref 80.0–100.0)
Monocytes Absolute: 0.5 10*3/uL (ref 0.1–1.0)
Monocytes Relative: 7 %
Neutro Abs: 2.8 10*3/uL (ref 1.7–7.7)
Neutrophils Relative %: 39 %
Platelets: 336 10*3/uL (ref 150–400)
RBC: 4.87 MIL/uL (ref 3.87–5.11)
RDW: 11.9 % (ref 11.5–15.5)
WBC: 7.1 10*3/uL (ref 4.0–10.5)
nRBC: 0 % (ref 0.0–0.2)

## 2023-03-07 LAB — COMPREHENSIVE METABOLIC PANEL
ALT: 64 U/L — ABNORMAL HIGH (ref 0–44)
AST: 28 U/L (ref 15–41)
Albumin: 3.9 g/dL (ref 3.5–5.0)
Alkaline Phosphatase: 86 U/L (ref 38–126)
Anion gap: 10 (ref 5–15)
BUN: 12 mg/dL (ref 6–20)
CO2: 28 mmol/L (ref 22–32)
Calcium: 9.2 mg/dL (ref 8.9–10.3)
Chloride: 99 mmol/L (ref 98–111)
Creatinine, Ser: 0.79 mg/dL (ref 0.44–1.00)
GFR, Estimated: >60 mL/min (ref >60)
Glucose, Bld: 119 mg/dL — ABNORMAL HIGH (ref 70–99)
Potassium: 3.6 mmol/L (ref 3.5–5.1)
Sodium: 137 mmol/L (ref 135–145)
Total Bilirubin: 0.4 mg/dL (ref 0.3–1.2)
Total Protein: 7.4 g/dL (ref 6.5–8.1)

## 2023-03-07 LAB — URINALYSIS, ROUTINE W REFLEX MICROSCOPIC
Bilirubin Urine: NEGATIVE
Glucose, UA: NEGATIVE mg/dL
Hgb urine dipstick: NEGATIVE
Ketones, ur: NEGATIVE mg/dL
Nitrite: NEGATIVE
Protein, ur: NEGATIVE mg/dL
Specific Gravity, Urine: 1.012 (ref 1.005–1.030)
pH: 6 (ref 5.0–8.0)

## 2023-03-07 LAB — LIPASE, BLOOD: Lipase: 31 U/L (ref 11–51)

## 2023-03-07 LAB — POC URINE PREG, ED: Preg Test, Ur: NEGATIVE

## 2023-03-07 MED ORDER — ONDANSETRON HCL 4 MG/2ML IJ SOLN
4.0000 mg | Freq: Once | INTRAMUSCULAR | Status: AC
  Administered 2023-03-07: 4 mg via INTRAVENOUS
  Filled 2023-03-07: qty 2

## 2023-03-07 MED ORDER — OXYCODONE-ACETAMINOPHEN 5-325 MG PO TABS
1.0000 | ORAL_TABLET | Freq: Once | ORAL | Status: AC
  Administered 2023-03-07: 1 via ORAL
  Filled 2023-03-07: qty 1

## 2023-03-07 MED ORDER — ONDANSETRON 4 MG PO TBDP: 4.0000 mg | ORAL_TABLET | Freq: Three times a day (TID) | ORAL | 0 refills | Status: DC | PRN

## 2023-03-07 MED ORDER — MORPHINE SULFATE (PF) 4 MG/ML IV SOLN
4.0000 mg | Freq: Once | INTRAVENOUS | Status: AC
  Administered 2023-03-07: 4 mg via INTRAVENOUS
  Filled 2023-03-07: qty 1

## 2023-03-07 MED ORDER — KETOROLAC TROMETHAMINE 30 MG/ML IJ SOLN
15.0000 mg | Freq: Once | INTRAMUSCULAR | Status: AC
  Administered 2023-03-07: 15 mg via INTRAVENOUS
  Filled 2023-03-07: qty 1

## 2023-03-07 MED ORDER — LACTATED RINGERS IV BOLUS
1000.0000 mL | Freq: Once | INTRAVENOUS | Status: AC
  Administered 2023-03-07: 1000 mL via INTRAVENOUS

## 2023-03-07 NOTE — Telephone Encounter (Signed)
Our office has tried x 3 to reach the pt to set up tele pre op appt. I will send FYI to the surgeon office, pt needs tele pre op appt. I will remove from pre op call back pool at this time. Will re-address once pt calls back.

## 2023-03-07 NOTE — ED Triage Notes (Signed)
Pt presents ambulatory to triage via POV with complaints of LLQ pain for the last week. Pt was seen last week at [facility] for rectal bleeding but states "nothing was done". Pt endorses some N/V. Rates the pain 8/10 - she notes taking prescribed oxycodone last night which helped her sx minimally. A&Ox4 at this time. Denies CP or SOB.

## 2023-03-07 NOTE — Telephone Encounter (Signed)
   Name: Sandra Gibbs  DOB: September 13, 1996  MRN: [id number]  Primary Cardiologist: None  Chart reviewed as part of pre-operative protocol coverage. Because of Sandra Gibbs's past medical history and time since last visit, she will require a follow-up in-office visit in order to better assess preoperative cardiovascular risk.  Pre-op covering staff: - Please schedule appointment and call patient to inform them. If patient already had an upcoming appointment within acceptable timeframe, please add "pre-op clearance" to the appointment notes so provider is aware. - Please contact requesting surgeon's office via preferred method (i.e, phone, fax) to inform them of need for appointment prior to surgery.   Napoleon Form, Leodis Rains, NP  03/07/2023, 11:22 AM

## 2023-03-07 NOTE — Telephone Encounter (Signed)
   Pre-operative Risk Assessment    Patient Name: Sandra Gibbs  DOB: 1996-06-09 MRN: [id number]      Request for Surgical Clearance    Procedure:   Bariatric surgery  Date of Surgery:  Clearance TBD                                 Surgeon:  Dr Delice Bison zychowizz Surgeon's Group or Practice Name:  [facility] Phone number:  [phone] Fax number:  [phone]   Type of Clearance Requested:   - Medical    Type of Anesthesia:  Not Indicated   Additional requests/questions:    SignedShawna Orleans   03/07/2023, 11:14 AM

## 2023-03-07 NOTE — Telephone Encounter (Signed)
This is a duplicate. Per pre op APP I did confirm if need tele or in office appt. Per pre op APP Robin Searing, NP today pt needs tele. Pt saw Dr. Anne Fu 08/2022.

## 2023-03-07 NOTE — ED Provider Notes (Signed)
[facility] Provider Note    Event Date/Time   First MD Initiated Contact with Patient 03/07/23 [id number]     (approximate)   History   Chief Complaint Abdominal Pain   HPI  Sandra Gibbs is a 27 y.o. female with past medical history of diabetes, kidney stones, Takotsubo cardiomyopathy, CHF, and endometriosis who presents to the ED complaining of abdominal pain.  Patient reports that she has been dealing with intermittent pain in the left lower quadrant of her abdomen for about the past week.  She describes the pain as sharp, has been much more severe and constant starting this morning.  She has been feeling nauseous but has not vomited, states she had blood in her stool last week but that this has been improving.  She was seen in the [facility] ED last week when she had the bleeding, minimal bleeding noted at that time and she was discharged home.  She followed up with her PCP earlier this week, when she was told she needed a CT scan which would be scheduled outpatient.  She denies any fevers, cough, chest pain, shortness of breath.  She has not had any dysuria, hematuria, flank pain, vaginal bleeding, or discharge.  She reports issues with abdominal pain in the past and diagnosis of IBS.     Physical Exam   Triage Vital Signs: ED Triage Vitals  Enc Vitals Group     BP 03/07/23 0632 (!) 137/101     Pulse Rate 03/07/23 0632 94     Resp 03/07/23 0632 18     Temp 03/07/23 0632 98.2 F (36.8 C)     Temp Source 03/07/23 0632 Oral     SpO2 03/07/23 0632 100 %     Weight 03/07/23 0630 219 lb (99.3 kg)     Height 03/07/23 0630 5' (1.524 m)     Head Circumference --      Peak Flow --      Pain Score 03/07/23 0630 8     Pain Loc --      Pain Edu? --      Excl. in [place]? --     Most recent vital signs: Vitals:   03/07/23 0632 03/07/23 0800  BP: (!) 137/101 123/86  Pulse: 94 91  Resp: 18 18  Temp: 98.2 F (36.8 C)   SpO2: 100% 100%    Constitutional: Alert and  oriented. Eyes: Conjunctivae are normal. Head: Atraumatic. Nose: No congestion/rhinnorhea. Mouth/Throat: Mucous membranes are moist.  Cardiovascular: Normal rate, regular rhythm. Grossly normal heart sounds.  2+ radial pulses bilaterally. Respiratory: Normal respiratory effort.  No retractions. Lungs CTAB. Gastrointestinal: Soft and tender to palpation in the left lower quadrant with voluntary guarding. No distention. Musculoskeletal: No lower extremity tenderness nor edema.  Neurologic:  Normal speech and language. No gross focal neurologic deficits are appreciated.    ED Results / Procedures / Treatments   Labs (all labs ordered are listed, but only abnormal results are displayed) Labs Reviewed  COMPREHENSIVE METABOLIC PANEL - Abnormal; Notable for the following components:      Result Value   Glucose, Bld 119 (*)    ALT 64 (*)    All other components within normal limits  URINALYSIS, ROUTINE W REFLEX MICROSCOPIC - Abnormal; Notable for the following components:   Color, Urine YELLOW (*)    APPearance CLEAR (*)    Leukocytes,Ua TRACE (*)    Bacteria, UA RARE (*)    All other components within normal  limits  URINE CULTURE  CBC WITH DIFFERENTIAL/PLATELET  LIPASE, BLOOD  POC URINE PREG, ED   RADIOLOGY CT abdomen/pelvis reviewed and interpreted by me with no inflammatory changes, focal fluid collections, or dilated bowel loops.  PROCEDURES:  Critical Care performed: No  Procedures   MEDICATIONS ORDERED IN ED: Medications  morphine (PF) 4 MG/ML injection 4 mg (4 mg Intravenous Given 03/07/23 0748)  ondansetron (ZOFRAN) injection 4 mg (4 mg Intravenous Given 03/07/23 0748)  lactated ringers bolus 1,000 mL (1,000 mLs Intravenous New Bag/Given 03/07/23 0748)  ketorolac (TORADOL) 30 MG/ML injection 15 mg (15 mg Intravenous Given 03/07/23 0830)  oxyCODONE-acetaminophen (PERCOCET/ROXICET) 5-325 MG per tablet 1 tablet (1 tablet Oral Given 03/07/23 0836)     IMPRESSION / MDM /  ASSESSMENT AND PLAN / ED COURSE  I reviewed the triage vital signs and the nursing notes.                              27 y.o. female with past medical history of diabetes, kidney stones, Takotsubo cardiomyopathy, CHF, and endometriosis who presents to the ED complaining of intermittent abdominal pain for the past week, acutely worse this morning.  Patient's presentation is most consistent with acute presentation with potential threat to life or bodily function.  Differential diagnosis includes, but is not limited to, diverticulitis, intra-abdominal abscess, kidney stone, UTI, ectopic pregnancy, inflammatory bowel disease, colitis, endometriosis, chronic abdominal pain.  Patient nontoxic-appearing and in no acute distress, vital signs are unremarkable.  She does have significant tenderness with voluntary guarding in the left lower quadrant, but does appear to have significant issues with chronic abdominal pain, multiple CT scans performed in the past showing only nonobstructing renal stones.  She does state that her PCP had scheduled her for a CT and given significant tenderness on exam, we will perform CT today in the ED.  Labs thus far reassuring without significant anemia, leukocytosis, tract abnormality, or AKI.  LFTs and lipase are unremarkable, pregnancy testing and urinalysis are pending.  We will treat symptomatically with IV morphine and Zofran, hydrate with IV fluids and reassess following imaging.  CT of abdomen/pelvis is negative for acute process.  Patient's pain improving but still present following morphine, she was given IV Toradol as well as her home dose of oxycodone.  Patient tolerating oral intake and is appropriate for discharge home with outpatient GI follow-up for possible colonoscopy and endoscopy.  She was counseled to return to the ED for new or worsening symptoms, patient agrees with plan.      FINAL CLINICAL IMPRESSION(S) / ED DIAGNOSES   Final diagnoses:  Chronic  abdominal pain  Rectal bleeding     Rx / DC Orders   ED Discharge Orders          Ordered    ondansetron (ZOFRAN-ODT) 4 MG disintegrating tablet  Every 8 hours PRN        03/07/23 0851             Note:  This document was prepared using Dragon voice recognition software and may include unintentional dictation errors.   Chesley Noon, MD 03/07/23 [phone]

## 2023-03-08 LAB — URINE CULTURE

## 2023-03-14 ENCOUNTER — Ambulatory Visit: Admission: RE | Admit: 2023-03-14 | Payer: Medicaid Other | Source: Ambulatory Visit

## 2023-03-15 ENCOUNTER — Other Ambulatory Visit: Payer: Medicaid Other

## 2023-06-03 ENCOUNTER — Encounter (HOSPITAL_BASED_OUTPATIENT_CLINIC_OR_DEPARTMENT_OTHER): Payer: Self-pay | Admitting: Emergency Medicine

## 2023-06-03 ENCOUNTER — Emergency Department (HOSPITAL_BASED_OUTPATIENT_CLINIC_OR_DEPARTMENT_OTHER): Payer: Medicaid Other

## 2023-06-03 ENCOUNTER — Other Ambulatory Visit: Payer: Self-pay

## 2023-06-03 ENCOUNTER — Emergency Department (HOSPITAL_BASED_OUTPATIENT_CLINIC_OR_DEPARTMENT_OTHER)
Admission: EM | Admit: 2023-06-03 | Discharge: 2023-06-03 | Disposition: A | Discharge status: Home / Self Care | Payer: Medicaid Other | Attending: Emergency Medicine | Admitting: Emergency Medicine

## 2023-06-03 DIAGNOSIS — R109 Unspecified abdominal pain: Secondary | ICD-10-CM | POA: Insufficient documentation

## 2023-06-03 DIAGNOSIS — R112 Nausea with vomiting, unspecified: Secondary | ICD-10-CM | POA: Diagnosis not present

## 2023-06-03 DIAGNOSIS — Z91040 Latex allergy status: Secondary | ICD-10-CM | POA: Insufficient documentation

## 2023-06-03 DIAGNOSIS — Z87442 Personal history of urinary calculi: Secondary | ICD-10-CM | POA: Insufficient documentation

## 2023-06-03 LAB — PREGNANCY, URINE: Preg Test, Ur: NEGATIVE

## 2023-06-03 LAB — CBC WITH DIFFERENTIAL/PLATELET
Abs Immature Granulocytes: 0.01 10*3/uL (ref 0.00–0.07)
Basophils Absolute: 0 10*3/uL (ref 0.0–0.1)
Basophils Relative: 1 %
Eosinophils Absolute: 0.1 10*3/uL (ref 0.0–0.5)
Eosinophils Relative: 1 %
HCT: 39.2 % (ref 36.0–46.0)
Hemoglobin: 13.9 g/dL (ref 12.0–15.0)
Immature Granulocytes: 0 %
Lymphocytes Relative: 53 %
Lymphs Abs: 3.9 10*3/uL (ref 0.7–4.0)
MCH: 29.2 pg (ref 26.0–34.0)
MCHC: 35.5 g/dL (ref 30.0–36.0)
MCV: 82.4 fL (ref 80.0–100.0)
Monocytes Absolute: 0.6 10*3/uL (ref 0.1–1.0)
Monocytes Relative: 8 %
Neutro Abs: 2.7 10*3/uL (ref 1.7–7.7)
Neutrophils Relative %: 37 %
Platelets: 347 10*3/uL (ref 150–400)
RBC: 4.76 MIL/uL (ref 3.87–5.11)
RDW: 11.9 % (ref 11.5–15.5)
WBC: 7.3 10*3/uL (ref 4.0–10.5)
nRBC: 0 % (ref 0.0–0.2)

## 2023-06-03 LAB — URINALYSIS, ROUTINE W REFLEX MICROSCOPIC
Bilirubin Urine: NEGATIVE
Glucose, UA: NEGATIVE mg/dL
Hgb urine dipstick: NEGATIVE
Ketones, ur: NEGATIVE mg/dL
Leukocytes,Ua: NEGATIVE
Nitrite: NEGATIVE
Protein, ur: NEGATIVE mg/dL
Specific Gravity, Urine: 1.013 (ref 1.005–1.030)
pH: 6.5 (ref 5.0–8.0)

## 2023-06-03 LAB — BASIC METABOLIC PANEL
Anion gap: 11 (ref 5–15)
BUN: 15 mg/dL (ref 6–20)
CO2: 26 mmol/L (ref 22–32)
Calcium: 10 mg/dL (ref 8.9–10.3)
Chloride: 101 mmol/L (ref 98–111)
Creatinine, Ser: 0.73 mg/dL (ref 0.44–1.00)
GFR, Estimated: >60 mL/min (ref >60)
Glucose, Bld: 92 mg/dL (ref 70–99)
Potassium: 4.4 mmol/L (ref 3.5–5.1)
Sodium: 138 mmol/L (ref 135–145)

## 2023-06-03 MED ORDER — SODIUM CHLORIDE 0.9 % IV BOLUS
1000.0000 mL | Freq: Once | INTRAVENOUS | Status: AC
  Administered 2023-06-03: 1000 mL via INTRAVENOUS

## 2023-06-03 MED ORDER — ONDANSETRON HCL 4 MG/2ML IJ SOLN
4.0000 mg | Freq: Once | INTRAMUSCULAR | Status: AC
  Administered 2023-06-03: 4 mg via INTRAVENOUS
  Filled 2023-06-03: qty 2

## 2023-06-03 MED ORDER — FENTANYL CITRATE PF 50 MCG/ML IJ SOSY
100.0000 ug | PREFILLED_SYRINGE | Freq: Once | INTRAMUSCULAR | Status: AC
  Administered 2023-06-03: 100 ug via INTRAVENOUS
  Filled 2023-06-03: qty 2

## 2023-06-03 NOTE — ED Triage Notes (Signed)
Pt in with L flank pain that began 2 hrs ago, +n/v since. Pt states hx of certain kidney disease that causes frequent kidney stones. Denies any fevers

## 2023-06-03 NOTE — ED Provider Notes (Signed)
[place] EMERGENCY DEPARTMENT AT [facility] Provider Note   CSN: [id number] Arrival date & time: 06/03/23  8413     History  Chief Complaint  Patient presents with   Flank Pain    Sandra Gibbs is a 27 y.o. female.  HPI     This is a 27 year old female with a history of kidney stones who presents with flank pain.  Patient reports left flank pain acutely worsening over the last 2 hours.  She has a history of kidney stones and states this feels similar.  No hematuria or dysuria.  No fevers.  Does report nausea and vomiting.  She has oxycodone at home but did not take it prior to arrival.  Home Medications Prior to Admission medications   Medication Sig Start Date End Date Taking? Authorizing Provider  busPIRone (BUSPAR) 10 MG tablet Take 10 mg by mouth 2 (two) times daily.    [provider]  busPIRone (BUSPAR) 15 MG tablet SMARTSIG:1 Tablet(s) By Mouth Morning-Evening 06/07/22   [provider]  dicyclomine (BENTYL) 20 MG tablet Take 20 mg by mouth 4 (four) times daily. 07/28/22   [provider]  DULoxetine (CYMBALTA) 20 MG capsule Take by mouth. 07/19/22   [provider]  fluocinonide gel (LIDEX) 0.05 % Apply 1 Application topically 2 (two) times daily. 01/01/23   Becky Augusta, NP  FLUoxetine (PROZAC) 10 MG capsule Take by mouth. 05/02/22   [provider]  gabapentin (NEURONTIN) 300 MG capsule Take 300 mg by mouth 3 (three) times daily.    [provider]  HYDROcodone-acetaminophen (NORCO/VICODIN) 5-325 MG tablet Take 1 tablet by mouth every 6 (six) hours as needed. 12/28/22   Brimage, Seward Meth, DO  Hyoscyamine Sulfate SL 0.125 MG SUBL Place under the tongue. 01/20/22   [provider]  ibuprofen (ADVIL) 600 MG tablet Take 1 tablet (600 mg total) by mouth every 6 (six) hours as needed. 06/16/22   Domenick Gong, MD  ipratropium (ATROVENT) 0.06 % nasal spray Place 2 sprays into both nostrils 4 (four) times  daily. 06/16/22   Domenick Gong, MD  ketorolac (TORADOL) 10 MG tablet Take 10 mg by mouth every 6 (six) hours as needed. 10/11/22   [provider]  letrozole (FEMARA) 2.5 MG tablet Take by mouth. 07/13/22 07/13/23  [provider]  leuprolide (LUPRON DEPOT, 8-MONTH,) 11.25 MG injection Inject 11.25 mg into the muscle every 3 (three) months. 03/25/22   Horald Pollen, MD  lidocaine (XYLOCAINE) 2 % solution Use as directed 15 mLs in the mouth or throat every 3 (three) hours as needed for mouth pain (swish and spit). 12/15/22   Eusebio Friendly B, PA-C  LORazepam (ATIVAN) 0.5 MG tablet Take 0.5 mg by mouth as needed. 04/28/22   [provider]  ondansetron (ZOFRAN) 4 MG tablet Take 4 mg by mouth every 8 (eight) hours as needed for nausea or vomiting.    [provider]  ondansetron (ZOFRAN-ODT) 4 MG disintegrating tablet Take 1 tablet (4 mg total) by mouth every 8 (eight) hours as needed for nausea or vomiting. 03/07/23   Chesley Noon, MD  ORILISSA 200 MG TABS Take 1 tablet by mouth 2 (two) times daily.    [provider]  promethazine (PHENERGAN) 25 MG tablet Take half to 1 tablet every 6 hours as needed for dizziness, nausea 08/02/22   Shirlee Latch, PA-C  promethazine-dextromethorphan (PROMETHAZINE-DM) 6.25-15 MG/5ML syrup Take 5 mLs by mouth 4 (four) times daily as needed for cough.  06/16/22   Domenick Gong, MD  tamsulosin [facility]) 0.4 MG CAPS capsule Take by mouth. 12/01/22 12/01/23  [provider]  traZODone (DESYREL) 50 MG tablet Take by mouth. 06/16/22   [provider]      Allergies    Iodinated contrast media, Wound dressing adhesive, Wound dressings, Latex, Morphine, and Other    Review of Systems   Review of Systems  Constitutional:  Negative for fever.  Respiratory:  Negative for shortness of breath.   Cardiovascular:  Negative for chest pain.  Gastrointestinal:  Positive for nausea and vomiting. Negative for abdominal  pain.  Genitourinary:  Positive for flank pain.  All other systems reviewed and are negative.   Physical Exam Updated Vital Signs BP (!) 156/98   Pulse 94   Temp 97.9 F (36.6 C) (Oral)   Resp (!) 22   Wt 99.8 kg   LMP  (LMP Unknown)   SpO2 100%   BMI 42.97 kg/m  Physical Exam Vitals and nursing note reviewed.  Constitutional:      Appearance: She is well-developed. She is obese. She is not ill-appearing.  HENT:     Head: Normocephalic and atraumatic.  Eyes:     Pupils: Pupils are equal, round, and reactive to light.  Cardiovascular:     Rate and Rhythm: Normal rate and regular rhythm.  Pulmonary:     Effort: Pulmonary effort is normal. No respiratory distress.  Abdominal:     Palpations: Abdomen is soft.     Tenderness: There is left CVA tenderness. There is no right CVA tenderness.  Musculoskeletal:     Cervical back: Neck supple.  Skin:    General: Skin is warm and dry.  Neurological:     Mental Status: She is alert and oriented to person, place, and time.  Psychiatric:        Mood and Affect: Mood normal.     ED Results / Procedures / Treatments   Labs (all labs ordered are listed, but only abnormal results are displayed) Labs Reviewed  URINALYSIS, ROUTINE W REFLEX MICROSCOPIC - Abnormal; Notable for the following components:      Result Value   Color, Urine COLORLESS (*)    All other components within normal limits  CBC WITH DIFFERENTIAL/PLATELET  BASIC METABOLIC PANEL  PREGNANCY, URINE    EKG None  Radiology CT Renal Stone Study  Result Date: 06/03/2023 CLINICAL DATA:  27 year old female with history of abdominal and flank pain suspected. EXAM: CT ABDOMEN AND PELVIS WITHOUT CONTRAST TECHNIQUE: Multidetector CT imaging of the abdomen and pelvis was performed following the standard protocol without IV contrast. RADIATION DOSE REDUCTION: This exam was performed according to the departmental dose-optimization program which includes automated exposure  control, adjustment of the mA and/or kV according to patient size and/or use of iterative reconstruction technique. COMPARISON:  CT of the abdomen and pelvis 03/07/2023. FINDINGS: Lower chest: Unremarkable. Hepatobiliary: Severe diffuse low attenuation throughout the visualized hepatic parenchyma, indicative of a background of severe hepatic steatosis. No suspicious cystic or solid hepatic lesions are confidently identified on today's noncontrast CT examination. Status post cholecystectomy. Pancreas: No definite pancreatic mass or peripancreatic fluid collections or inflammatory changes are noted on today's noncontrast CT examination. Spleen: Unremarkable. Adrenals/Urinary Tract: Vague high attenuation associated with the medullary pyramids throughout both kidneys, in association with multiple tiny nonobstructive calculi in the collecting systems of both kidneys measuring up to 3 mm in the lower pole collecting system of the right kidney, indicative of medullary nephrocalcinosis.  No calculi are noted along the course of either ureter or within the lumen of the urinary bladder. No hydroureteronephrosis. Urinary bladder is unremarkable in appearance. Bilateral adrenal glands are normal in appearance. Stomach/Bowel: Unenhanced appearance of the stomach is normal. No pathologic dilatation of small bowel or colon. Normal appendix. Vascular/Lymphatic: No atherosclerotic calcifications are noted in the abdominal aorta or pelvic vasculature. No lymphadenopathy noted in the abdomen or pelvis. Reproductive: Uterus and ovaries are unremarkable in appearance. Other: No significant volume of ascites.  No pneumoperitoneum. Musculoskeletal: There are no aggressive appearing lytic or blastic lesions noted in the visualized portions of the skeleton. IMPRESSION: 1. No acute findings are noted to account for the patient's symptoms. 2. 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 3. Severe hepatic steatosis. Electronically Signed   By: Trudie Reed M.D.   On: 06/03/2023 05:29    Procedures Procedures    Medications Ordered in ED Medications  ondansetron [facility]) injection 4 mg (4 mg Intravenous Given 06/03/23 0359)  sodium chloride 0.9 % bolus 1,000 mL (0 mLs Intravenous Stopped 06/03/23 0506)  fentaNYL (SUBLIMAZE) injection 100 mcg (100 mcg Intravenous Given 06/03/23 0359)    ED Course/ Medical Decision Making/ A&P                                 Medical Decision Making Amount and/or Complexity of Data Reviewed Labs: ordered. Radiology: ordered.  Risk Prescription drug management.   This patient presents to the ED for concern of flank pain, this involves an extensive number of treatment options, and is a complaint that carries with it a high risk of complications and morbidity.  I considered the following differential and admission for this acute, potentially life threatening condition.  The differential diagnosis includes kidney stone, pyelonephritis, colitis  MDM:    This is a 27 year old female who presents with left flank pain.  She is overall nontoxic-appearing and vital signs are reassuring.  Reports history of kidney stone causing similar symptoms although her last few CT scans have been negative for obstructing stones.  She does have a history of chronic pain as well.  She was given pain and nausea medication.  Labs are largely reassuring.  No significant metabolic derangements.  Preserved renal function.  No white count.  CT again today does not demonstrate any obstructing stones.  Unclear etiology of the patient's pain.  Recommend supportive measures at home.  (Labs, imaging, consults)  Labs: I Ordered, and personally interpreted labs.  The pertinent results include: CBC, BMP, urinalysis, urine pregnancy  Imaging Studies ordered: I ordered imaging studies including CT stone study I  independently visualized and interpreted imaging. I agree with the radiologist interpretation  Additional history obtained from chart review.  External records from outside source obtained and reviewed including prior imaging  Cardiac Monitoring: The patient was maintained on a cardiac monitor.  If on the cardiac monitor, I personally viewed and interpreted the cardiac monitored which showed an underlying rhythm of: Sinus rhythm  Reevaluation: After the interventions noted above, I reevaluated the patient and found that they have :stayed the same  Social Determinants of Health:  lives independently  Disposition: Discharge  Co morbidities that complicate the patient evaluation  Past Medical History:  Diagnosis Date   ADHD    Endometriosis    Kidney stones      Medicines Meds ordered this  encounter  Medications   ondansetron (ZOFRAN) injection 4 mg   sodium chloride 0.9 % bolus 1,000 mL   fentaNYL (SUBLIMAZE) injection 100 mcg    I have reviewed the patients home medicines and have made adjustments as needed  Problem List / ED Course: Problem List Items Addressed This Visit       Other   Flank pain - Primary                Final Clinical Impression(s) / ED Diagnoses Final diagnoses:  Flank pain    Rx / DC Orders ED Discharge Orders     None         Shon Baton, MD 06/03/23 [phone]

## 2023-06-03 NOTE — ED Notes (Signed)
Pt to CT

## 2023-06-03 NOTE — Discharge Instructions (Signed)
You were seen today for flank pain.  You have no evidence of obstructing kidney stones.  Your urinalysis is not consistent with a UTI.  Unclear etiology of your pain but your workup is overall reassuring.  Follow-up with your primary doctor.

## 2023-08-13 ENCOUNTER — Emergency Department (HOSPITAL_BASED_OUTPATIENT_CLINIC_OR_DEPARTMENT_OTHER)
Admission: EM | Admit: 2023-08-13 | Discharge: 2023-08-13 | Disposition: A | Discharge status: Home / Self Care | Payer: Medicaid Other | Attending: Emergency Medicine | Admitting: Emergency Medicine

## 2023-08-13 ENCOUNTER — Emergency Department (HOSPITAL_BASED_OUTPATIENT_CLINIC_OR_DEPARTMENT_OTHER): Payer: Medicaid Other

## 2023-08-13 ENCOUNTER — Encounter (HOSPITAL_BASED_OUTPATIENT_CLINIC_OR_DEPARTMENT_OTHER): Payer: Self-pay | Admitting: Emergency Medicine

## 2023-08-13 DIAGNOSIS — R079 Chest pain, unspecified: Secondary | ICD-10-CM | POA: Diagnosis present

## 2023-08-13 DIAGNOSIS — R0789 Other chest pain: Secondary | ICD-10-CM | POA: Diagnosis not present

## 2023-08-13 DIAGNOSIS — Z91040 Latex allergy status: Secondary | ICD-10-CM | POA: Insufficient documentation

## 2023-08-13 DIAGNOSIS — R519 Headache, unspecified: Secondary | ICD-10-CM | POA: Diagnosis not present

## 2023-08-13 LAB — BASIC METABOLIC PANEL
Anion gap: 14 (ref 5–15)
BUN: 11 mg/dL (ref 6–20)
CO2: 24 mmol/L (ref 22–32)
Calcium: 9 mg/dL (ref 8.9–10.3)
Chloride: 100 mmol/L (ref 98–111)
Creatinine, Ser: 0.82 mg/dL (ref 0.44–1.00)
GFR, Estimated: >60 mL/min (ref >60)
Glucose, Bld: 104 mg/dL — ABNORMAL HIGH (ref 70–99)
Potassium: 3.4 mmol/L — ABNORMAL LOW (ref 3.5–5.1)
Sodium: 138 mmol/L (ref 135–145)

## 2023-08-13 LAB — TROPONIN I (HIGH SENSITIVITY): Troponin I (High Sensitivity): <2 ng/L (ref <18)

## 2023-08-13 LAB — PREGNANCY, URINE: Preg Test, Ur: NEGATIVE

## 2023-08-13 LAB — CBC
HCT: 40.4 % (ref 36.0–46.0)
Hemoglobin: 14.1 g/dL (ref 12.0–15.0)
MCH: 29.3 pg (ref 26.0–34.0)
MCHC: 34.9 g/dL (ref 30.0–36.0)
MCV: 83.8 fL (ref 80.0–100.0)
Platelets: 362 10*3/uL (ref 150–400)
RBC: 4.82 MIL/uL (ref 3.87–5.11)
RDW: 12.3 % (ref 11.5–15.5)
WBC: 7.9 10*3/uL (ref 4.0–10.5)
nRBC: 0 % (ref 0.0–0.2)

## 2023-08-13 MED ORDER — KETOROLAC TROMETHAMINE 30 MG/ML IJ SOLN: 15.0000 mg | Freq: Once | INTRAMUSCULAR | Status: DC

## 2023-08-13 MED ORDER — KETOROLAC TROMETHAMINE 15 MG/ML IJ SOLN
15.0000 mg | Freq: Once | INTRAMUSCULAR | Status: AC
  Administered 2023-08-13: 15 mg via INTRAMUSCULAR
  Filled 2023-08-13: qty 1

## 2023-08-13 NOTE — ED Provider Notes (Signed)
[place] EMERGENCY DEPARTMENT AT [facility]  Provider Note  CSN: [id number] Arrival date & time: 08/13/23 0130  History Chief Complaint  Patient presents with   Tachycardia   Chest Pain    Sandra Gibbs is a 27 y.o. 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 Similar to previous visits, managed by cardiology at [facility] and planned for an implantable cardiac monitor. She had ED visits for chest pain in the [facility] area on 10/8, 10/21 and 10/25 with negative workups. She reports some residual headache now.    Home Medications Prior to Admission medications   Medication Sig Start Date End Date Taking? Authorizing Provider  busPIRone (BUSPAR) 10 MG tablet Take 10 mg by mouth 2 (two) times daily.    [provider]  busPIRone (BUSPAR) 15 MG tablet SMARTSIG:1 Tablet(s) By Mouth Morning-Evening 06/07/22   [provider]  dicyclomine (BENTYL) 20 MG tablet Take 20 mg by mouth 4 (four) times daily. 07/28/22   [provider]  DULoxetine (CYMBALTA) 20 MG capsule Take by mouth. 07/19/22   [provider]  fluocinonide gel (LIDEX) 0.05 % Apply 1 Application topically 2 (two) times daily. 01/01/23   Becky Augusta, NP  FLUoxetine (PROZAC) 10 MG capsule Take by mouth. 05/02/22   [provider]  gabapentin (NEURONTIN) 300 MG capsule Take 300 mg by mouth 3 (three) times daily.    [provider]  HYDROcodone-acetaminophen (NORCO/VICODIN) 5-325 MG tablet Take 1 tablet by mouth every 6 (six) hours as needed. 12/28/22   Brimage, Seward Meth, DO  Hyoscyamine Sulfate SL 0.125 MG SUBL Place under the tongue. 01/20/22   [provider]  ibuprofen (ADVIL) 600 MG tablet Take 1 tablet (600 mg total) by mouth every 6 (six) hours as needed. 06/16/22   Domenick Gong, MD  ipratropium (ATROVENT) 0.06 % nasal  spray Place 2 sprays into both nostrils 4 (four) times daily. 06/16/22   Domenick Gong, MD  ketorolac (TORADOL) 10 MG tablet Take 10 mg by mouth every 6 (six) hours as needed. 10/11/22   [provider]  leuprolide (LUPRON DEPOT, 68-MONTH,) 11.25 MG injection Inject 11.25 mg into the muscle every 3 (three) months. 03/25/22   Horald Pollen, MD  lidocaine (XYLOCAINE) 2 % solution Use as directed 15 mLs in the mouth or throat every 3 (three) hours as needed for mouth pain (swish and spit). 12/15/22   Eusebio Friendly B, PA-C  LORazepam (ATIVAN) 0.5 MG tablet Take 0.5 mg by mouth as needed. 04/28/22   [provider]  ondansetron (ZOFRAN) 4 MG tablet Take 4 mg by mouth every 8 (eight) hours as needed for nausea or vomiting.    [provider]  ondansetron (ZOFRAN-ODT) 4 MG disintegrating tablet Take 1 tablet (4 mg total) by mouth every 8 (eight) hours as needed for nausea or vomiting. 03/07/23   Chesley Noon, MD  ORILISSA 200 MG TABS Take 1 tablet by mouth 2 (two) times daily.    [provider]  promethazine (PHENERGAN) 25 MG tablet Take half to 1 tablet every 6 hours as needed for dizziness, nausea 08/02/22   Shirlee Latch, PA-C  promethazine-dextromethorphan (PROMETHAZINE-DM) 6.25-15 MG/5ML syrup Take 5 mLs by mouth 4 (four) times daily as needed for cough. 06/16/22   Domenick Gong, MD  tamsulosin (FLOMAX) 0.4 MG CAPS capsule Take by mouth.  12/01/22 12/01/23  [provider]  traZODone (DESYREL) 50 MG tablet Take by mouth. 06/16/22   [provider]     Allergies    Iodinated contrast media, Wound dressing adhesive, Wound dressings, Latex, Morphine, and Other   Review of Systems   Review of Systems Please see HPI for pertinent positives and negatives  Physical Exam BP (!) 131/102   Pulse 90   Temp 98.3 F (36.8 C) (Oral)   Resp 16   Ht 5' (1.524 m)   Wt 99.8 kg   LMP  (LMP Unknown) Comment: not having period due to non BC meds  SpO2  99%   BMI 42.97 kg/m   Physical Exam Vitals and nursing note reviewed.  Constitutional:      Appearance: Normal appearance.  HENT:     Head: Normocephalic and atraumatic.     Nose: Nose normal.     Mouth/Throat:     Mouth: Mucous membranes are moist.  Eyes:     Extraocular Movements: Extraocular movements intact.     Conjunctiva/sclera: Conjunctivae normal.  Cardiovascular:     Rate and Rhythm: Normal rate and regular rhythm.  Pulmonary:     Effort: Pulmonary effort is normal.     Breath sounds: Normal breath sounds.  Abdominal:     General: Abdomen is flat.     Palpations: Abdomen is soft.     Tenderness: There is no abdominal tenderness.  Musculoskeletal:        General: No swelling. Normal range of motion.     Cervical back: Neck supple.  Skin:    General: Skin is warm and dry.  Neurological:     General: No focal deficit present.     Mental Status: She is alert.  Psychiatric:        Mood and Affect: Mood normal.     ED Results / Procedures / Treatments   EKG EKG Interpretation Date/Time:  Sunday August 13 2023 01:28:05 EDT Ventricular Rate:  98 PR Interval:  148 QRS Duration:  102 QT Interval:  333 QTC Calculation: 426 R Axis:   54  Text Interpretation: Sinus rhythm RSR' in V1 or V2, right VCD or RVH Borderline T abnormalities, inferior leads No significant change since last tracing Confirmed by Susy Frizzle [phone]) on 08/13/2023 1:29:45 AM  Procedures Procedures  Medications Ordered in the ED Medications  ketorolac (TORADOL) 30 MG/ML injection 15 mg (has no administration in time range)    Initial Impression and Plan  Patient here with her chronic chest pain and tachycardia, rate improved here. Labs done in triage show normal CBC, BMP, and Trop. I personally viewed the images from radiology studies and agree with radiologist interpretation: CXR is clear. Given duration and chronicity of patient's symptoms delta trop is not needed. Will give  Toradol for residual pain/headache. Otherwise she is stable for discharge with continued outpatient management.   ED Course       MDM Rules/Calculators/A&P Medical Decision Making Problems Addressed: Atypical chest pain: chronic illness or injury with exacerbation, progression, or side effects of treatment Nonintractable headache, unspecified chronicity pattern, unspecified headache type: chronic illness or injury with exacerbation, progression, or side effects of treatment  Amount and/or Complexity of Data Reviewed Labs: ordered. Decision-making details documented in ED Course. Radiology: ordered and independent interpretation performed. Decision-making details documented in ED Course. ECG/medicine tests: ordered and independent interpretation performed. Decision-making details documented in ED Course.     Final Clinical Impression(s) / ED Diagnoses Final diagnoses:  Atypical chest pain  Nonintractable headache, unspecified chronicity pattern, unspecified headache type    Rx / DC Orders ED Discharge Orders     None        Pollyann Savoy, MD 08/13/23 [phone]

## 2023-08-13 NOTE — ED Notes (Signed)
Patient transported to X-ray 

## 2023-08-13 NOTE — ED Triage Notes (Signed)
CP, SOB and high heart rate since 8:30 11/2, Hx of same and is being followed for same. Has metoprolol at home normally takes 12.5 but took 19 tonight with some relief.

## 2023-08-29 ENCOUNTER — Emergency Department
Admission: EM | Admit: 2023-08-29 | Discharge: 2023-08-29 | Discharge status: Against Medical Advice | Payer: Medicaid Other | Attending: Emergency Medicine | Admitting: Emergency Medicine

## 2023-08-29 ENCOUNTER — Emergency Department: Payer: Medicaid Other

## 2023-08-29 ENCOUNTER — Other Ambulatory Visit: Payer: Self-pay

## 2023-08-29 DIAGNOSIS — Z5321 Procedure and treatment not carried out due to patient leaving prior to being seen by health care provider: Secondary | ICD-10-CM | POA: Insufficient documentation

## 2023-08-29 DIAGNOSIS — R079 Chest pain, unspecified: Secondary | ICD-10-CM | POA: Diagnosis present

## 2023-08-29 DIAGNOSIS — R110 Nausea: Secondary | ICD-10-CM | POA: Diagnosis not present

## 2023-08-29 LAB — BASIC METABOLIC PANEL
Anion gap: 10 (ref 5–15)
BUN: 13 mg/dL (ref 6–20)
CO2: 25 mmol/L (ref 22–32)
Calcium: 9.2 mg/dL (ref 8.9–10.3)
Chloride: 100 mmol/L (ref 98–111)
Creatinine, Ser: 0.68 mg/dL (ref 0.44–1.00)
GFR, Estimated: >60 mL/min (ref >60)
Glucose, Bld: 107 mg/dL — ABNORMAL HIGH (ref 70–99)
Potassium: 3.9 mmol/L (ref 3.5–5.1)
Sodium: 135 mmol/L (ref 135–145)

## 2023-08-29 LAB — CBC WITH DIFFERENTIAL/PLATELET
Abs Immature Granulocytes: 0.04 10*3/uL (ref 0.00–0.07)
Basophils Absolute: 0 10*3/uL (ref 0.0–0.1)
Basophils Relative: 1 %
Eosinophils Absolute: 0.1 10*3/uL (ref 0.0–0.5)
Eosinophils Relative: 1 %
HCT: 44.4 % (ref 36.0–46.0)
Hemoglobin: 15.1 g/dL — ABNORMAL HIGH (ref 12.0–15.0)
Immature Granulocytes: 1 %
Lymphocytes Relative: 45 %
Lymphs Abs: 3.9 10*3/uL (ref 0.7–4.0)
MCH: 29.2 pg (ref 26.0–34.0)
MCHC: 34 g/dL (ref 30.0–36.0)
MCV: 85.9 fL (ref 80.0–100.0)
Monocytes Absolute: 0.6 10*3/uL (ref 0.1–1.0)
Monocytes Relative: 7 %
Neutro Abs: 3.8 10*3/uL (ref 1.7–7.7)
Neutrophils Relative %: 45 %
Platelets: 386 10*3/uL (ref 150–400)
RBC: 5.17 MIL/uL — ABNORMAL HIGH (ref 3.87–5.11)
RDW: 12.1 % (ref 11.5–15.5)
WBC: 8.4 10*3/uL (ref 4.0–10.5)
nRBC: 0 % (ref 0.0–0.2)

## 2023-08-29 LAB — TROPONIN I (HIGH SENSITIVITY): Troponin I (High Sensitivity): 2 ng/L (ref <18)

## 2023-08-29 NOTE — ED Triage Notes (Signed)
Pt reports chest pain and nausea that began 15 min PTA. Pt describes pain as burning. 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 Pt denies shortness of breath.

## 2023-09-04 ENCOUNTER — Ambulatory Visit
Admission: EM | Admit: 2023-09-04 | Discharge: 2023-09-04 | Disposition: A | Discharge status: Home / Self Care | Payer: Medicaid Other | Attending: Internal Medicine | Admitting: Internal Medicine

## 2023-09-04 ENCOUNTER — Encounter: Payer: Self-pay | Admitting: Emergency Medicine

## 2023-09-04 DIAGNOSIS — B372 Candidiasis of skin and nail: Secondary | ICD-10-CM | POA: Diagnosis not present

## 2023-09-04 MED ORDER — KETOCONAZOLE 2 % EX CREA: 1.0000 | TOPICAL_CREAM | Freq: Every day | CUTANEOUS | 0 refills | Status: DC

## 2023-09-04 NOTE — ED Triage Notes (Signed)
Pt has a rash in the crease of her groin area on the left side. She gets this often and was treating with antifungal cream. The area is not getting any better its been 1 week.

## 2023-09-04 NOTE — ED Provider Notes (Signed)
[facility]    CSN: [id number] Arrival date & time: 09/04/23  4098      History   Chief Complaint No chief complaint on file.   HPI Sandra Gibbs is a 27 y.o. female presents for rash.  Patient reports a history of fungal infections in her groin.  Reports over the past week she has had a red itchy rash in her left groin.  States she was using some kind of prescribed topical that was improving but she ran out.  Denies any swelling, drainage, warmth, fevers or chills.  No new contacts including soaps, medications, detergents, etc.  No OTC medications have been used since onset.  No other concerns at this time.  HPI  Past Medical History:  Diagnosis Date   ADHD    Endometriosis    Kidney stones     Patient Active Problem List   Diagnosis Date Noted   Hepatic steatosis 04/08/2022   Major depressive disorder with current active episode 04/08/2022   Diarrhea 02/16/2022   Epigastric pain 02/16/2022   Personal history of kidney stones 12/31/2021   Vomiting 10/06/2021   Dysuria 10/06/2021   Hair follicle infection 10/06/2021   Need for vaccination 01/13/2021   Takotsubo cardiomyopathy 01/01/2021   Acute systolic CHF (congestive heart failure) (HCC) 01/01/2021   Pyelonephritis 12/31/2020   Diabetes mellitus without complication (HCC)    Elevated troponin    Laxity of left anterior cruciate ligament 07/21/2020   Attention deficit hyperactivity disorder (ADHD), 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   Renal calculi 12/07/2019   Irritable bowel syndrome without diarrhea 12/07/2019   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   Chondral lesion 09/10/2019   Patellofemoral disorder of left knee 09/10/2019   Generalized anxiety disorder 08/13/2019   MDD (major depressive disorder), recurrent episode, moderate (HCC) 08/13/2019   OSA (obstructive sleep apnea) 07/04/2017   Chronic daily headache 05/17/2017   Obesity (BMI 35.0-39.9 without comorbidity)  05/17/2017   Hematochezia 01/21/2015   Flank pain 01/07/2015   Elevated transaminase level 01/07/2015   S/P laparoscopic cholecystectomy 01/01/2015   Gall bladder stones 12/24/2014   Recurrent biliary colic 12/24/2014    Past Surgical History:  Procedure Laterality Date   CYST REMOVAL TRUNK  2019   GALLBLADDER SURGERY  2016   KIDNEY SURGERY     LEFT HEART CATH AND CORONARY ANGIOGRAPHY N/A 01/01/2021   Procedure: LEFT HEART CATH AND CORONARY ANGIOGRAPHY;  Surgeon: Alwyn Pea, MD;  Location: [facility] INVASIVE CV LAB;  Service: Cardiovascular;  Laterality: N/A;   OVARIAN CYST SURGERY  2011   TONSILLECTOMY      OB History     Gravida  1   Para  1   Term  0   Preterm  1   AB      Living  1      SAB      IAB      Ectopic      Multiple      Live Births  1            Home Medications    Prior to Admission medications   Medication Sig Start Date End Date Taking? Authorizing Provider  ARIPiprazole (ABILIFY) 5 MG tablet Take 5 mg by mouth at bedtime. 08/19/23  Yes [provider]  ketoconazole (NIZORAL) 2 % cream Apply 1 Application topically daily. 09/04/23  Yes Radford Pax, NP  methylphenidate 27 MG PO CR tablet Take 27 mg by mouth every morning. 07/16/23  Yes [provider]  metoprolol succinate (TOPROL-XL) 25 MG 24 hr tablet Take 1 tablet by mouth daily. 09/01/23  Yes [provider]  busPIRone (BUSPAR) 10 MG tablet Take 10 mg by mouth 2 (two) times daily.    [provider]  busPIRone (BUSPAR) 15 MG tablet SMARTSIG:1 Tablet(s) By Mouth Morning-Evening 06/07/22   [provider]  dicyclomine (BENTYL) 20 MG tablet Take 20 mg by mouth 4 (four) times daily. 07/28/22   [provider]  DULoxetine (CYMBALTA) 20 MG capsule Take by mouth. 07/19/22   [provider]  fluocinonide gel (LIDEX) 0.05 % Apply 1 Application topically 2 (two) times daily. 01/01/23   Becky Augusta, NP  FLUoxetine (PROZAC) 10 MG  capsule Take by mouth. 05/02/22   [provider]  gabapentin (NEURONTIN) 300 MG capsule Take 300 mg by mouth 3 (three) times daily.    [provider]  HYDROcodone-acetaminophen (NORCO/VICODIN) 5-325 MG tablet Take 1 tablet by mouth every 6 (six) hours as needed. 12/28/22   Brimage, Seward Meth, DO  Hyoscyamine Sulfate SL 0.125 MG SUBL Place under the tongue. 01/20/22   [provider]  ibuprofen (ADVIL) 600 MG tablet Take 1 tablet (600 mg total) by mouth every 6 (six) hours as needed. 06/16/22   Domenick Gong, MD  ipratropium (ATROVENT) 0.06 % nasal spray Place 2 sprays into both nostrils 4 (four) times daily. 06/16/22   Domenick Gong, MD  ketorolac (TORADOL) 10 MG tablet Take 10 mg by mouth every 6 (six) hours as needed. 10/11/22   [provider]  leuprolide (LUPRON DEPOT, 77-MONTH,) 11.25 MG injection Inject 11.25 mg into the muscle every 3 (three) months. 03/25/22   Horald Pollen, MD  lidocaine (XYLOCAINE) 2 % solution Use as directed 15 mLs in the mouth or throat every 3 (three) hours as needed for mouth pain (swish and spit). 12/15/22   Eusebio Friendly B, PA-C  LORazepam (ATIVAN) 0.5 MG tablet Take 0.5 mg by mouth as needed. 04/28/22   [provider]  ondansetron (ZOFRAN) 4 MG tablet Take 4 mg by mouth every 8 (eight) hours as needed for nausea or vomiting.    [provider]  ondansetron (ZOFRAN-ODT) 4 MG disintegrating tablet Take 1 tablet (4 mg total) by mouth every 8 (eight) hours as needed for nausea or vomiting. 03/07/23   Chesley Noon, MD  ORILISSA 200 MG TABS Take 1 tablet by mouth 2 (two) times daily.    [provider]  promethazine (PHENERGAN) 25 MG tablet Take half to 1 tablet every 6 hours as needed for dizziness, nausea 08/02/22   Shirlee Latch, PA-C  promethazine-dextromethorphan (PROMETHAZINE-DM) 6.25-15 MG/5ML syrup Take 5 mLs by mouth 4 (four) times daily as needed for cough. 06/16/22   Domenick Gong, MD  tamsulosin  [facility]) 0.4 MG CAPS capsule Take by mouth. 12/01/22 12/01/23  [provider]  traZODone (DESYREL) 50 MG tablet Take by mouth. 06/16/22   [provider]    Family History Family History  Problem Relation Age of Onset   Heart murmur Mother    Mitral valve prolapse Mother    Heart attack Father    Atrial fibrillation Maternal Grandfather    Stroke Paternal Grandmother    Mitral valve prolapse Paternal Grandmother     Social History Social History   Tobacco Use   Smoking status: Never   Smokeless tobacco: Never  Vaping Use   Vaping status: Never Used  Substance Use Topics   Alcohol use: No  Drug use: No     Allergies   Iodinated contrast media, Wound dressing adhesive, Wound dressings, Morphine and codeine, Latex, Morphine, and Other   Review of Systems Review of Systems  Skin:  Positive for rash.     Physical Exam Triage Vital Signs ED Triage Vitals  Encounter Vitals Group     BP 09/04/23 0922 119/87     Systolic BP Percentile --      Diastolic BP Percentile --      Pulse Rate 09/04/23 0922 69     Resp 09/04/23 0922 16     Temp 09/04/23 0922 98.2 F (36.8 C)     Temp Source 09/04/23 0922 Oral     SpO2 09/04/23 0922 98 %     Weight --      Height --      Head Circumference --      Peak Flow --      Pain Score 09/04/23 0920 3     Pain Loc --      Pain Education --      Exclude from Growth Chart --    No data found.  Updated Vital Signs BP 119/87 (BP Location: Left Wrist)   Pulse 69   Temp 98.2 F (36.8 C) (Oral)   Resp 16   LMP  (LMP Unknown) Comment: not having period due to non BC meds  SpO2 98%   Visual Acuity Right Eye Distance:   Left Eye Distance:   Bilateral Distance:    Right Eye Near:   Left Eye Near:    Bilateral Near:     Physical Exam Vitals and nursing note reviewed.  Constitutional:      General: She is not in acute distress.    Appearance: Normal appearance. She is obese. She is not ill-appearing.  HENT:      Head: Normocephalic and atraumatic.  Eyes:     Pupils: Pupils are equal, round, and reactive to light.  Cardiovascular:     Rate and Rhythm: Normal rate.  Pulmonary:     Effort: Pulmonary effort is normal.  Skin:    General: Skin is warm and dry.          Comments: Mildly erythematous rash to the left groin.  No swelling, drainage, warmth.  Neurological:     General: No focal deficit present.     Mental Status: She is alert and oriented to person, place, and time.  Psychiatric:        Mood and Affect: Mood normal.        Behavior: Behavior normal.      UC Treatments / Results  Labs (all labs ordered are listed, but only abnormal results are displayed) Labs Reviewed - No data to display  EKG   Radiology No results found.  Procedures Procedures (including critical care time)  Medications Ordered in UC Medications - No data to display  Initial Impression / Assessment and Plan / UC Course  I have reviewed the triage vital signs and the nursing notes.  Pertinent labs & imaging results that were available during my care of the patient were reviewed by me and considered in my medical decision making (see chart for details).     Start ketoconazole topically daily.  Advised to keep area clean and dry.  PCP follow-up as symptoms do not improve.  ER precautions reviewed. Final Clinical Impressions(s) / UC Diagnoses   Final diagnoses:  Candidal intertrigo     Discharge Instructions  Start ketoconazole topical cream daily to the area.  Keep the areas clean and dry as possible.  Follow-up with your PCP if symptoms do not improve.  Please go to the ER for any worsening symptoms.  Hope you feel better soon!    ED Prescriptions     Medication Sig Dispense Auth. Provider   ketoconazole (NIZORAL) 2 % cream Apply 1 Application topically daily. 60 g Radford Pax, NP      PDMP not reviewed this encounter.   Radford Pax, NP 09/04/23 [phone]

## 2023-09-04 NOTE — Discharge Instructions (Signed)
Start ketoconazole topical cream daily to the area.  Keep the areas clean and dry as possible.  Follow-up with your PCP if symptoms do not improve.  Please go to the ER for any worsening symptoms.  Hope you feel better soon!

## 2023-11-09 ENCOUNTER — Inpatient Hospital Stay (HOSPITAL_COMMUNITY)
Admission: AD | Admit: 2023-11-09 | Discharge: 2023-11-09 | Disposition: A | Discharge status: Home / Self Care | Payer: Medicaid Other | Attending: Family Medicine | Admitting: Family Medicine

## 2023-11-09 DIAGNOSIS — R888 Abnormal findings in other body fluids and substances: Secondary | ICD-10-CM | POA: Insufficient documentation

## 2023-11-09 DIAGNOSIS — N939 Abnormal uterine and vaginal bleeding, unspecified: Secondary | ICD-10-CM | POA: Insufficient documentation

## 2023-11-09 LAB — HCG, QUANTITATIVE, PREGNANCY: hCG, Beta Chain, Quant, S: 11 m[IU]/mL — ABNORMAL HIGH (ref <5)

## 2023-11-09 NOTE — MAU Note (Addendum)
.  Sandra Gibbs is a 28 y.o. at Unknown here in MAU reporting vaginal bleeding for 2 days. Had negative upt last few days but today her upt was positive. Continues to have vag bleeding. Has used 3-4 pads today. No pain.. Pt states she had blood work at [facility] and showed elevated pregnancy hormone. Gerrit Heck CNM in Triage to see pt and discuss plan of care with which pt agrees  LMP: 10/11/23 Onset of complaint: Tues Pain score: 0 Vitals:   11/09/23 2237 11/09/23 2239  BP:  118/86  Pulse: 97   Resp: 19   Temp: 98.2 F (36.8 C)   SpO2: 100%      FHT: n/a  Lab orders placed from triage: upt

## 2023-11-09 NOTE — Progress Notes (Signed)
Pt's blood was drawn and then pt was ok to go home per [facility] CNM. Shanda Bumps will send pt a message thru MyChart once BHCG results to direct pt as to further plan of care. Pt preferred this rather than waiting on BHCG results tonight.

## 2023-11-09 NOTE — MAU Provider Note (Signed)
None     S [name] is a 28 y.o. G1P0101 patient who presents to MAU today with complaint of vaginal bleeding x 3 days.  Patient denies passing of clots and cramping she compares to mild period.  Patient was seen yesterday and had negative UPT, but positive hCG of 8.9.  Patient reports she had positive UPT today.  LMP was 10/11/2023.   O BP 118/86   Pulse 97   Temp 98.2 F (36.8 C)   Resp 19   Ht 5' (1.524 m)   Wt 104.3 kg   SpO2 100%   BMI 44.92 kg/m  Physical Exam Vitals reviewed. Exam conducted with a chaperone present Alvino Chapel, RN).  Constitutional:      General: She is not in acute distress.    Appearance: Normal appearance. She is obese. She is not ill-appearing.  HENT:     Head: Normocephalic and atraumatic.  Eyes:     Conjunctiva/sclera: Conjunctivae normal.  Cardiovascular:     Rate and Rhythm: Normal rate.  Pulmonary:     Effort: Pulmonary effort is normal. No respiratory distress.  Musculoskeletal:        General: Normal range of motion.     Cervical back: Normal range of motion.  Neurological:     Mental Status: She is alert and oriented to person, place, and time.  Psychiatric:        Mood and Affect: Mood normal.        Behavior: Behavior normal.     A Medical screening exam complete Vaginal Bleeding Elevated hCG  P Discussed obtaining repeat hCG to determine current level. Reviewed potential trend including decreasing, stable, and rising. Cautioned that timing and lab variance may require additional testing. Plan to collect hCG and send mychart message regarding follow up.  Discharge from MAU in stable condition  Gerrit Heck, [place] 11/09/2023 10:52 PM    Addendum (2:05 AM) hCG returns as above. Mychart message sent. Patient instructed to return in 48 hours for repeat test. Appt entered into epic per protocol.   Cherre Robins MSN, CNM Advanced Practice Provider, [facility] [employer]

## 2023-11-12 ENCOUNTER — Inpatient Hospital Stay (HOSPITAL_COMMUNITY)
Admission: AD | Admit: 2023-11-12 | Discharge: 2023-11-12 | Disposition: A | Discharge status: Home / Self Care | Payer: Medicaid Other | Attending: Obstetrics & Gynecology | Admitting: Obstetrics & Gynecology

## 2023-11-12 ENCOUNTER — Inpatient Hospital Stay (HOSPITAL_COMMUNITY): Payer: Medicaid Other

## 2023-11-12 DIAGNOSIS — R7989 Other specified abnormal findings of blood chemistry: Secondary | ICD-10-CM | POA: Diagnosis present

## 2023-11-12 LAB — HCG, QUANTITATIVE, PREGNANCY: hCG, Beta Chain, Quant, S: 8 m[IU]/mL — ABNORMAL HIGH (ref <5)

## 2023-11-12 NOTE — Discharge Instructions (Signed)
You will need to have a repeat Hcg level in 1 week

## 2023-11-12 NOTE — MAU Provider Note (Signed)
   S [name] is a 28 y.o. G1P0101 patient who presents to MAU today with complaint of Repeat hcg only on 11/09/23 was 11 seen in MAU with vaginal bleeding x 3 days and UPT positive with an hcg of 8.9. LMP 10/11/2023. Patient reports she lives in [place] and has no PCP or OB/GYN. Denies any further VB, pain at this time.  O BP 126/81 (BP Location: Right Arm)   Pulse 81   Temp 98 F (36.7 C) (Oral)   Resp 17   SpO2 98%  Physical Exam Constitutional:      General: She is not in acute distress.    Appearance: Normal appearance. She is obese. She is not ill-appearing.  HENT:     Head: Normocephalic.  Cardiovascular:     Rate and Rhythm: Normal rate.  Pulmonary:     Effort: Pulmonary effort is normal.     Breath sounds: Normal breath sounds.  Musculoskeletal:        General: Normal range of motion.     Cervical back: Normal range of motion.  Skin:    General: Skin is warm.  Neurological:     Mental Status: She is alert and oriented to person, place, and time.  Psychiatric:        Mood and Affect: Mood normal.        Behavior: Behavior normal.        Judgment: Judgment normal.     A Medical screening exam complete   1. Elevated serum hCG (Primary)  - Patient with elevated hcg & s/p vaginal bleeding - hcg today @ 8 decreased from 11 - 1 week f/u MAU or PCP for lab only visit  (advised patient results should be <5)  P Discharge from MAU in stable condition Patient given the option of transfer to [facility] for further evaluation or seek care in outpatient facility of choice  List of options for follow-up given  Warning signs for worsening condition that would warrant emergency follow-up discussed Patient may return to MAU as needed  Verbalized understanding  Colman Cater, NP 11/12/2023 10:17 AM

## 2023-11-12 NOTE — MAU Note (Signed)
.  Sandra Gibbs is a 28 y.o. at Unknown here in MAU for follow up hcg. Denies pain or bleeding today.     Pain score: 0 There were no vitals filed for this visit.    Lab orders placed from triage: hcg

## 2023-11-18 ENCOUNTER — Encounter: Payer: Self-pay | Admitting: Student

## 2023-11-18 ENCOUNTER — Inpatient Hospital Stay (HOSPITAL_COMMUNITY)
Admission: AD | Admit: 2023-11-18 | Discharge: 2023-11-18 | Disposition: A | Discharge status: Home / Self Care | Payer: Medicaid Other | Attending: Obstetrics and Gynecology | Admitting: Obstetrics and Gynecology

## 2023-11-18 DIAGNOSIS — Z3A01 Less than 8 weeks gestation of pregnancy: Secondary | ICD-10-CM | POA: Diagnosis not present

## 2023-11-18 DIAGNOSIS — O039 Complete or unspecified spontaneous abortion without complication: Secondary | ICD-10-CM | POA: Insufficient documentation

## 2023-11-18 DIAGNOSIS — R109 Unspecified abdominal pain: Secondary | ICD-10-CM | POA: Diagnosis present

## 2023-11-18 LAB — HCG, QUANTITATIVE, PREGNANCY: hCG, Beta Chain, Quant, S: 2 m[IU]/mL (ref <5)

## 2023-11-18 NOTE — MAU Provider Note (Signed)
 History   Chief Complaint:  Follow-up   Sandra Gibbs is a 28 y.o. G1P0101 at Unknown who presents for labs.  Has had hCGs checked since January 30.  Most recent hCG was 8 on 2/2.  Continues to have some lower abdominal cramping but denies vaginal bleeding.  Physical Exam   Blood pressure 130/87, pulse 94, temperature 97.7 F (36.5 C), temperature source Oral, resp. rate 18, height 5' (1.524 m), weight 104.7 kg, last menstrual period 10/11/2023, SpO2 98%.  Physical Examination: General appearance - alert, well appearing, and in no distress Mental status - alert, oriented to person, place, and time, normal mood, behavior, speech, dress, motor activity, and thought processes Eyes - pupils equal and reactive, extraocular eye movements intact, sclera anicteric Chest - normal respiratory effort  Labs: Results for orders placed or performed during the hospital encounter of 11/18/23 (from the past 24 hours)  hCG, quantitative, pregnancy   Collection Time: 11/18/23 11:19 AM  Result Value Ref Range   hCG, Beta Chain, Quant, S 2 <5 mIU/mL    Ultrasound Studies:   No results found.  Assessment:   1. Miscarriage     Component     Latest Ref Rng 11/09/2023 11/12/2023 11/18/2023  HCG, Beta Chain, Quant, S     <5 mIU/mL 11 (H)  8 (H)  2      Plan: -Discharge home in stable condition -Patient to f/u with her ob/gyn at [facility] -Patient may return to MAU as needed or if her condition were to change or worsen  Rocky Satterfield, NP 11/18/2023, 5:21 PM

## 2023-11-18 NOTE — MAU Note (Addendum)
 Sandra Gibbs is a 28 y.o. at Unknown here in MAU reporting: here for repeat blood work.  Moderate cramping continues in lower abd. Dizzy at times. No bleeding.  LMP: 1/1, started bleeding 2/1- but had +preg test Onset of complaint: ongoing Pain score: modeate Vitals:   11/18/23 1054  BP: 130/87  Pulse: 94  Resp: 18  Temp: 97.7 F (36.5 C)  SpO2: 99%      Lab orders placed from triage:  HCG ordered   Will check orthostatic vs in triage, due to c/o of dizziness.

## 2023-12-06 ENCOUNTER — Inpatient Hospital Stay (HOSPITAL_COMMUNITY)
Admission: AD | Admit: 2023-12-06 | Discharge: 2023-12-06 | Disposition: A | Discharge status: Home / Self Care | Payer: Medicaid Other | Attending: Obstetrics & Gynecology | Admitting: Obstetrics & Gynecology

## 2023-12-06 ENCOUNTER — Encounter (HOSPITAL_COMMUNITY): Payer: Self-pay

## 2023-12-06 ENCOUNTER — Inpatient Hospital Stay (HOSPITAL_COMMUNITY): Payer: Medicaid Other

## 2023-12-06 DIAGNOSIS — O26891 Other specified pregnancy related conditions, first trimester: Secondary | ICD-10-CM | POA: Insufficient documentation

## 2023-12-06 DIAGNOSIS — O358XX0 Maternal care for other (suspected) fetal abnormality and damage, not applicable or unspecified: Secondary | ICD-10-CM | POA: Diagnosis not present

## 2023-12-06 DIAGNOSIS — Z3A01 Less than 8 weeks gestation of pregnancy: Secondary | ICD-10-CM | POA: Diagnosis not present

## 2023-12-06 DIAGNOSIS — R300 Dysuria: Secondary | ICD-10-CM | POA: Diagnosis present

## 2023-12-06 DIAGNOSIS — O3680X0 Pregnancy with inconclusive fetal viability, not applicable or unspecified: Secondary | ICD-10-CM | POA: Diagnosis not present

## 2023-12-06 LAB — HCG, QUANTITATIVE, PREGNANCY: hCG, Beta Chain, Quant, S: 137 m[IU]/mL — ABNORMAL HIGH (ref <5)

## 2023-12-06 LAB — WET PREP, GENITAL
Clue Cells Wet Prep HPF POC: NONE SEEN
Sperm: NONE SEEN
Trich, Wet Prep: NONE SEEN
WBC, Wet Prep HPF POC: <10 (ref <10)
Yeast Wet Prep HPF POC: NONE SEEN

## 2023-12-06 LAB — CBC
HCT: 41.4 % (ref 36.0–46.0)
Hemoglobin: 14.4 g/dL (ref 12.0–15.0)
MCH: 29.9 pg (ref 26.0–34.0)
MCHC: 34.8 g/dL (ref 30.0–36.0)
MCV: 85.9 fL (ref 80.0–100.0)
Platelets: 364 10*3/uL (ref 150–400)
RBC: 4.82 MIL/uL (ref 3.87–5.11)
RDW: 12.4 % (ref 11.5–15.5)
WBC: 8.1 10*3/uL (ref 4.0–10.5)
nRBC: 0 % (ref 0.0–0.2)

## 2023-12-06 LAB — URINALYSIS, ROUTINE W REFLEX MICROSCOPIC
Bilirubin Urine: NEGATIVE
Glucose, UA: NEGATIVE mg/dL
Hgb urine dipstick: NEGATIVE
Ketones, ur: NEGATIVE mg/dL
Leukocytes,Ua: NEGATIVE
Nitrite: NEGATIVE
Protein, ur: NEGATIVE mg/dL
Specific Gravity, Urine: 1.011 (ref 1.005–1.030)
pH: 5 (ref 5.0–8.0)

## 2023-12-06 LAB — POCT PREGNANCY, URINE: Preg Test, Ur: POSITIVE — AB

## 2023-12-06 LAB — HIV ANTIBODY (ROUTINE TESTING W REFLEX): HIV Screen 4th Generation wRfx: NONREACTIVE

## 2023-12-06 LAB — ABO/RH: ABO/RH(D): O POS

## 2023-12-06 LAB — RPR: RPR Ser Ql: NONREACTIVE

## 2023-12-06 MED ORDER — ONDANSETRON 4 MG PO TBDP: 4.0000 mg | ORAL_TABLET | Freq: Three times a day (TID) | ORAL | 0 refills | Status: AC | PRN

## 2023-12-06 MED ORDER — PHENAZOPYRIDINE HCL 200 MG PO TABS: 200.0000 mg | ORAL_TABLET | Freq: Three times a day (TID) | ORAL | 0 refills | Status: AC

## 2023-12-06 MED ORDER — PROCHLORPERAZINE MALEATE 10 MG PO TABS: 10.0000 mg | ORAL_TABLET | Freq: Four times a day (QID) | ORAL | 0 refills | Status: AC | PRN

## 2023-12-06 NOTE — MAU Note (Signed)
.  Sandra Gibbs is a 28 y.o. at Unknown here in MAU reporting: Concerns for kidney infection. She reports she is [redacted] weeks pregnant. Patient reports bilateral flank pain for 4 days with pain with urination. She also reports N/V since last night. Two episodes of emesis today. Reports a fever of 102 last night. Last took 650 mg of Tylenol around 0600 this morning. She reports a strong odor to her urine. Denies VB.  Went to [facility] ED yesterday and left prior to provider assessment.   Hx recurrent nephrolithiasis and recurrent UTI's and follow Dr. Juanito Doom with [facility]. See Perinatology consult note in Care Everywhere from 2/18.  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 Last hcg on 2/8 and hcg was 2.  LMP: 11/07/2023 - approximate Onset of complaint: 4 days  Pain score: 7/10 flank  Vitals:   12/06/23 0948  BP: 137/83  Pulse: 77  Resp: 18  Temp: 98 F (36.7 C)  SpO2: 100%     FHT: n/a Lab orders placed from triage: POCT Preg, UA

## 2023-12-06 NOTE — MAU Provider Note (Signed)
 History     CSN: [id number]  Arrival date and time: 12/06/23 4742   Event Date/Time   First Provider Initiated Contact with Patient 12/06/23 1135      Chief Complaint  Patient presents with   Emesis   Nausea   Flank Pain   HPI Sandra Gibbs is a 28 y.o. year old [id number] female at [redacted]w[redacted]d weeks gestation who presents to MAU reporting (+) HPT 5 days ago. She is concerned that she has a kidney infection, because she is having some bilateral flank pain, dysuria, pressure with urination, and urinary frequency for 4 days. She reports N/V since last night; vomited twice today.  She reports having a fever of 102 last night; took Tylenol 650 mg @ 0600 this morning. She went to [facility] ED last night, but LWBS "because they announced that it would be about 10-12 hour wait." She has a h/o recurrent kidney stones and UTIs (Dr. Juanito Doom - [facility]). She had a recent pregnancy loss (SAB or blighted ovum) in January 2025. Her most recent hCG level was 2 on 11/18/2023. She reports her LMP was 11/07/2023. She denies actively TTC, but also not taking/using any pregnancy prevention measures.    OB History     Gravida  3   Para  1   Term  0   Preterm  1   AB  1   Living  1      SAB  1   IAB      Ectopic      Multiple      Live Births  1           Past Medical History:  Diagnosis Date   ADHD    Endometriosis    Kidney stones     Past Surgical History:  Procedure Laterality Date   CYST REMOVAL TRUNK  2019   GALLBLADDER SURGERY  2016   KIDNEY SURGERY     LEFT HEART CATH AND CORONARY ANGIOGRAPHY N/A 01/01/2021   Procedure: LEFT HEART CATH AND CORONARY ANGIOGRAPHY;  Surgeon: Alwyn Pea, MD;  Location: [facility] INVASIVE CV LAB;  Service: Cardiovascular;  Laterality: N/A;   OVARIAN CYST SURGERY  2011   TONSILLECTOMY      Family History  Problem Relation Age of Onset   Heart murmur Mother    Mitral valve prolapse Mother    Heart attack Father    Atrial fibrillation  Maternal Grandfather    Stroke Paternal Grandmother    Mitral valve prolapse Paternal Grandmother     Social History   Tobacco Use   Smoking status: Never   Smokeless tobacco: Never  Vaping Use   Vaping status: Never Used  Substance Use Topics   Alcohol use: No   Drug use: No    Allergies:  Allergies  Allergen Reactions   Iodinated Contrast Media Itching   Wound Dressing Adhesive Itching, Other (See Comments) and Rash    Other: "tears skin off" Other: "tears skin off" Other: "tears skin off"  Other reaction(s): Other (see comments) Other: "tears skin off" Other: "tears skin off" Other: "tears skin off"   Wound Dressings Itching and Rash    Other reaction(s): Other (See Comments) Other: "tears skin off" Other: "tears skin off" Other: "tears skin off"   Morphine And Codeine Itching and Rash   Latex Itching   Morphine Itching and Rash   Other Itching and Rash    Reports only when pushed rapidly, tolerated otherwise    No  medications prior to admission.    Review of Systems  Constitutional: Negative.   HENT: Negative.    Eyes: Negative.   Respiratory: Negative.    Cardiovascular: Negative.   Gastrointestinal:  Positive for nausea and vomiting.  Endocrine: Negative.   Genitourinary:  Positive for dysuria, flank pain, frequency and urgency.  Skin: Negative.   Allergic/Immunologic: Negative.   Neurological: Negative.   Hematological: Negative.   Psychiatric/Behavioral: Negative.     Physical Exam   Blood pressure 137/83, pulse 77, temperature 98 F (36.7 C), temperature source Oral, resp. rate 18, height 5' (1.524 m), weight 105.8 kg, last menstrual period 11/07/2023, SpO2 100%.  Physical Exam Vitals and nursing note reviewed.  Constitutional:      Appearance: Normal appearance. She is obese.     Comments: afebrile  Cardiovascular:     Rate and Rhythm: Normal rate.  Abdominal:     Tenderness: There is no right CVA tenderness or left CVA tenderness.   Musculoskeletal:        General: Normal range of motion.  Skin:    General: Skin is warm and dry.     Comments: Multiple tattoos and ear/facial/lip piercings  Neurological:     Mental Status: She is alert and oriented to person, place, and time.  Psychiatric:        Mood and Affect: Mood normal.        Behavior: Behavior normal.        Thought Content: Thought content normal.        Judgment: Judgment normal.    MAU Course  Procedures  MDM CCUA UPT CBC ABO/Rh HCG Wet Prep GC/CT -- Results pending  RPR -- Results pending  OB U/S < 14 wks TVUS  Results for orders placed or performed during the hospital encounter of 12/06/23 (from the past 24 hours)  Urinalysis, Routine w reflex microscopic -Urine, Clean Catch     Status: None   Collection Time: 12/06/23  9:56 AM  Result Value Ref Range   Color, Urine YELLOW YELLOW   APPearance CLEAR CLEAR   Specific Gravity, Urine 1.011 1.005 - 1.030   pH 5.0 5.0 - 8.0   Glucose, UA NEGATIVE NEGATIVE mg/dL   Hgb urine dipstick NEGATIVE NEGATIVE   Bilirubin Urine NEGATIVE NEGATIVE   Ketones, ur NEGATIVE NEGATIVE mg/dL   Protein, ur NEGATIVE NEGATIVE mg/dL   Nitrite NEGATIVE NEGATIVE   Leukocytes,Ua NEGATIVE NEGATIVE  Pregnancy, urine POC     Status: Abnormal   Collection Time: 12/06/23  9:56 AM  Result Value Ref Range   Preg Test, Ur POSITIVE (A) NEGATIVE  ABO/Rh     Status: None   Collection Time: 12/06/23 11:41 AM  Result Value Ref Range   ABO/RH(D) O POS    No rh immune globuloin      NOT A RH IMMUNE GLOBULIN CANDIDATE, PT RH POSITIVE Performed at [facility], [address] [place], [place] [address]   HIV Antibody (routine testing w rflx)     Status: None   Collection Time: 12/06/23 11:44 AM  Result Value Ref Range   HIV Screen 4th Generation wRfx Non Reactive Non Reactive  CBC     Status: None   Collection Time: 12/06/23 11:44 AM  Result Value Ref Range   WBC 8.1 4.0 - 10.5 K/uL   RBC 4.82 3.87 - 5.11 MIL/uL    Hemoglobin 14.4 12.0 - 15.0 g/dL   HCT 91.4 78.2 - 95.6 %   MCV 85.9 80.0 -  100.0 fL   MCH 29.9 26.0 - 34.0 pg   MCHC 34.8 30.0 - 36.0 g/dL   RDW 16.1 09.6 - 04.5 %   Platelets 364 150 - 400 K/uL   nRBC 0.0 0.0 - 0.2 %  hCG, quantitative, pregnancy     Status: Abnormal   Collection Time: 12/06/23 11:44 AM  Result Value Ref Range   hCG, Beta Chain, Quant, S 137 (H) <5 mIU/mL  Wet prep, genital     Status: None   Collection Time: 12/06/23 12:02 PM   Specimen: Vaginal  Result Value Ref Range   Yeast Wet Prep HPF POC NONE SEEN NONE SEEN   Trich, Wet Prep NONE SEEN NONE SEEN   Clue Cells Wet Prep HPF POC NONE SEEN NONE SEEN   WBC, Wet Prep HPF POC <10 <10   Sperm NONE SEEN     US OB LESS THAN 14 WEEKS WITH OB TRANSVAGINAL Result Date: 12/06/2023 CLINICAL DATA:  Flank pain and cramping. Four weeks and 1 day pregnant by last menstrual period. Quantitative beta hCG 137. EXAM: OBSTETRIC <14 WK [place] AND TRANSVAGINAL OB US TECHNIQUE: Both transabdominal and transvaginal ultrasound examinations were performed for complete evaluation of the gestation as well as the maternal uterus, adnexal regions, and pelvic cul-de-sac. Transvaginal technique was performed to assess early pregnancy. COMPARISON:  None Available. FINDINGS: Intrauterine gestational sac: Not visualized Yolk sac:  Not visualized Embryo:  Not visualized Maternal uterus/adnexae: Normal-appearing uterus and endometrium with an endometrial thickness of 1.3 cm. Normal-appearing ovaries with a left ovarian corpus luteum cyst noted. No adnexal mass or free peritoneal fluid. IMPRESSION: Normal appearing pelvis with no intrauterine or extra uterine gestation visualized. Correlation with serial quantitative beta hCG is recommended. Electronically Signed   By: Beckie Salts M.D.   On: 12/06/2023 13:31      Assessment and Plan  1. Pregnancy of unknown anatomic location (Primary) - Information provided on ectopic pregnancy for information purposes  only  2. Dysuria during pregnancy in first trimester - Reassurance given that no signs of infection noted on CCUA results today; no CVA tenderness noted on PE  - Prescription for: Pyridium 200 mg po TID x 2 days for comfort 3. [redacted] weeks gestation of pregnancy - Information provided on Safe Meds in Pregnancy    - Discharge patient - Appt scheduled to have hCG level repeated on 12/08/2023 @ 0830. Advised to arrive at 0815 for check-in process. - Patient verbalized an understanding of the plan of care and agrees.   Raelyn Mora, CNM 12/06/2023, 11:36 AM

## 2023-12-07 LAB — GC/CHLAMYDIA PROBE AMP (~~LOC~~) NOT AT ARMC
Chlamydia: NEGATIVE
Comment: NEGATIVE
Comment: NORMAL
Neisseria Gonorrhea: NEGATIVE

## 2023-12-08 ENCOUNTER — Other Ambulatory Visit: Payer: Self-pay

## 2023-12-08 ENCOUNTER — Encounter: Payer: Self-pay | Admitting: General Practice

## 2023-12-08 ENCOUNTER — Ambulatory Visit: Payer: Self-pay

## 2023-12-08 VITALS — BP 113/76 | HR 87 | Ht 60.0 in | Wt 235.0 lb

## 2023-12-08 DIAGNOSIS — O3680X0 Pregnancy with inconclusive fetal viability, not applicable or unspecified: Secondary | ICD-10-CM

## 2023-12-08 DIAGNOSIS — Z3A01 Less than 8 weeks gestation of pregnancy: Secondary | ICD-10-CM

## 2023-12-08 LAB — BETA HCG QUANT (REF LAB): hCG Quant: 209 m[IU]/mL

## 2023-12-08 NOTE — Progress Notes (Signed)
 Beta HCG Follow-up Visit  Sandra Gibbs presents to [facility] for follow-up beta HCG lab. She was seen in MAU on 2/26 for  concerns regarding kidney infection & + UPT at home after a miscarriage last month . Patient denies pain, bleeding today. Discussed with patient that we are following beta HCG levels today. Results will be back in approximately 2 hours. Valid contact number for patient confirmed. I will call the patient with results.   Beta HCG results:         2/26          137          2/28          209      Results and patient history reviewed with Dr Crissie Reese, who states bhcg is rising appropriately at this time. Patient should have follow up bhcg on Monday then if still rising appropriately, scheduled for an ultrasound in 2 weeks. Patient called and informed of plan for follow-up. Scheduled lab appt for 3/3. Patient already has ultrasound set up with high risk doctor at the end of March.  [name] 12/08/2023 8:34 AM

## 2023-12-11 ENCOUNTER — Ambulatory Visit: Payer: Medicaid Other

## 2023-12-11 ENCOUNTER — Encounter: Payer: Self-pay | Admitting: *Deleted

## 2023-12-11 ENCOUNTER — Telehealth: Payer: Self-pay | Admitting: *Deleted

## 2023-12-11 NOTE — Telephone Encounter (Signed)
 Sandra Gibbs And [facility] stat bhcg. I called and left a message notifying her she missed a scheduled appointment and to call or message [place] to reschedule for later today or tomorrow. I will also send a message to patient. [name]
